# Patient Record
Sex: Male | Born: 1975 | Race: White | Hispanic: No | Marital: Married | Smoking: Current every day smoker
Health system: Southern US, Community
[De-identification: ages and names within clinical notes are randomized; demographics above are authoritative.]

## PROBLEM LIST (undated history)

## (undated) DIAGNOSIS — K219 Gastro-esophageal reflux disease without esophagitis: Secondary | ICD-10-CM

## (undated) DIAGNOSIS — I219 Acute myocardial infarction, unspecified: Secondary | ICD-10-CM

## (undated) DIAGNOSIS — M25562 Pain in left knee: Secondary | ICD-10-CM

## (undated) DIAGNOSIS — G4730 Sleep apnea, unspecified: Secondary | ICD-10-CM

## (undated) DIAGNOSIS — M25561 Pain in right knee: Secondary | ICD-10-CM

## (undated) DIAGNOSIS — F419 Anxiety disorder, unspecified: Secondary | ICD-10-CM

## (undated) DIAGNOSIS — I10 Essential (primary) hypertension: Secondary | ICD-10-CM

## (undated) DIAGNOSIS — G8929 Other chronic pain: Secondary | ICD-10-CM

## (undated) DIAGNOSIS — E7800 Pure hypercholesterolemia, unspecified: Secondary | ICD-10-CM

## (undated) DIAGNOSIS — I639 Cerebral infarction, unspecified: Secondary | ICD-10-CM

## (undated) DIAGNOSIS — I2699 Other pulmonary embolism without acute cor pulmonale: Secondary | ICD-10-CM

## (undated) DIAGNOSIS — M1990 Unspecified osteoarthritis, unspecified site: Secondary | ICD-10-CM

## (undated) DIAGNOSIS — Z8739 Personal history of other diseases of the musculoskeletal system and connective tissue: Secondary | ICD-10-CM

## (undated) DIAGNOSIS — E119 Type 2 diabetes mellitus without complications: Secondary | ICD-10-CM

## (undated) DIAGNOSIS — F4310 Post-traumatic stress disorder, unspecified: Secondary | ICD-10-CM

## (undated) HISTORY — PX: TONSILLECTOMY: SUR1361

## (undated) HISTORY — PX: HERNIA REPAIR: SHX51

## (undated) HISTORY — PX: OTHER SURGICAL HISTORY: SHX169

## (undated) HISTORY — PX: ANGIOPLASTY / STENTING FEMORAL: SUR30

---

## 2004-04-18 ENCOUNTER — Other Ambulatory Visit: Payer: Self-pay

## 2004-06-22 DIAGNOSIS — K219 Gastro-esophageal reflux disease without esophagitis: Secondary | ICD-10-CM | POA: Diagnosis present

## 2004-12-12 ENCOUNTER — Ambulatory Visit: Payer: Self-pay | Admitting: Gastroenterology

## 2005-11-27 ENCOUNTER — Ambulatory Visit: Payer: Self-pay | Admitting: Unknown Physician Specialty

## 2007-06-26 ENCOUNTER — Other Ambulatory Visit: Payer: Self-pay

## 2007-06-26 ENCOUNTER — Observation Stay: Payer: Self-pay | Admitting: Cardiology

## 2007-08-01 ENCOUNTER — Other Ambulatory Visit: Payer: Self-pay

## 2007-08-01 ENCOUNTER — Emergency Department: Payer: Self-pay | Admitting: Unknown Physician Specialty

## 2007-08-06 ENCOUNTER — Ambulatory Visit: Payer: Self-pay | Admitting: Family Medicine

## 2007-08-12 ENCOUNTER — Other Ambulatory Visit: Payer: Self-pay

## 2007-08-12 ENCOUNTER — Emergency Department: Payer: Self-pay | Admitting: Emergency Medicine

## 2007-08-14 ENCOUNTER — Ambulatory Visit: Payer: Self-pay | Admitting: Family Medicine

## 2007-08-20 ENCOUNTER — Ambulatory Visit: Payer: Self-pay | Admitting: Gastroenterology

## 2007-08-26 ENCOUNTER — Emergency Department: Payer: Self-pay | Admitting: Emergency Medicine

## 2007-08-26 ENCOUNTER — Other Ambulatory Visit: Payer: Self-pay

## 2008-02-06 ENCOUNTER — Emergency Department: Payer: Self-pay

## 2008-02-06 ENCOUNTER — Other Ambulatory Visit: Payer: Self-pay

## 2008-05-28 ENCOUNTER — Emergency Department: Payer: Self-pay | Admitting: Emergency Medicine

## 2008-06-13 ENCOUNTER — Other Ambulatory Visit: Payer: Self-pay

## 2008-06-13 ENCOUNTER — Emergency Department: Payer: Self-pay | Admitting: Emergency Medicine

## 2008-06-14 ENCOUNTER — Emergency Department: Payer: Self-pay | Admitting: Emergency Medicine

## 2008-07-29 ENCOUNTER — Ambulatory Visit: Payer: Self-pay | Admitting: Orthopedic Surgery

## 2008-10-15 ENCOUNTER — Emergency Department: Payer: Self-pay | Admitting: Emergency Medicine

## 2009-01-12 DIAGNOSIS — I10 Essential (primary) hypertension: Secondary | ICD-10-CM | POA: Diagnosis present

## 2009-03-31 ENCOUNTER — Emergency Department: Payer: Self-pay | Admitting: Emergency Medicine

## 2009-04-01 ENCOUNTER — Emergency Department: Payer: Self-pay | Admitting: Emergency Medicine

## 2009-04-06 ENCOUNTER — Emergency Department: Payer: Self-pay | Admitting: Emergency Medicine

## 2009-04-07 ENCOUNTER — Emergency Department: Payer: Self-pay | Admitting: Emergency Medicine

## 2009-04-22 ENCOUNTER — Emergency Department: Payer: Self-pay | Admitting: Emergency Medicine

## 2009-04-23 ENCOUNTER — Emergency Department: Payer: Self-pay | Admitting: Emergency Medicine

## 2009-05-17 ENCOUNTER — Ambulatory Visit: Payer: Self-pay | Admitting: Surgery

## 2009-05-17 ENCOUNTER — Emergency Department: Payer: Self-pay | Admitting: Emergency Medicine

## 2009-05-17 ENCOUNTER — Emergency Department: Payer: Self-pay

## 2009-05-18 ENCOUNTER — Emergency Department: Payer: Self-pay | Admitting: Emergency Medicine

## 2009-06-30 ENCOUNTER — Emergency Department: Payer: Self-pay | Admitting: Emergency Medicine

## 2009-08-09 ENCOUNTER — Emergency Department: Payer: Self-pay | Admitting: Internal Medicine

## 2009-09-25 ENCOUNTER — Emergency Department: Payer: Self-pay | Admitting: Emergency Medicine

## 2009-09-27 ENCOUNTER — Emergency Department: Payer: Self-pay | Admitting: Emergency Medicine

## 2009-10-22 ENCOUNTER — Emergency Department: Payer: Self-pay | Admitting: Emergency Medicine

## 2009-11-08 ENCOUNTER — Emergency Department: Payer: Self-pay | Admitting: Emergency Medicine

## 2009-11-09 ENCOUNTER — Emergency Department: Payer: Self-pay | Admitting: Emergency Medicine

## 2009-12-03 ENCOUNTER — Emergency Department: Payer: Self-pay

## 2009-12-03 ENCOUNTER — Emergency Department: Payer: Self-pay | Admitting: Emergency Medicine

## 2009-12-07 ENCOUNTER — Emergency Department (HOSPITAL_BASED_OUTPATIENT_CLINIC_OR_DEPARTMENT_OTHER): Admission: EM | Admit: 2009-12-07 | Discharge: 2009-12-07 | Payer: Self-pay | Admitting: Emergency Medicine

## 2009-12-07 ENCOUNTER — Ambulatory Visit: Payer: Self-pay | Admitting: Diagnostic Radiology

## 2010-01-26 ENCOUNTER — Emergency Department: Payer: Self-pay | Admitting: Emergency Medicine

## 2010-02-03 ENCOUNTER — Inpatient Hospital Stay: Payer: Self-pay | Admitting: Psychiatry

## 2010-03-06 ENCOUNTER — Emergency Department: Payer: Self-pay | Admitting: Emergency Medicine

## 2010-04-21 ENCOUNTER — Emergency Department: Payer: Self-pay | Admitting: Unknown Physician Specialty

## 2010-05-09 ENCOUNTER — Ambulatory Visit: Payer: Self-pay | Admitting: Cardiology

## 2010-06-24 ENCOUNTER — Emergency Department: Payer: Self-pay | Admitting: Emergency Medicine

## 2010-07-26 DIAGNOSIS — F17210 Nicotine dependence, cigarettes, uncomplicated: Secondary | ICD-10-CM | POA: Insufficient documentation

## 2010-07-26 DIAGNOSIS — Z7189 Other specified counseling: Secondary | ICD-10-CM | POA: Insufficient documentation

## 2010-07-26 DIAGNOSIS — Z0000 Encounter for general adult medical examination without abnormal findings: Secondary | ICD-10-CM | POA: Insufficient documentation

## 2010-09-08 ENCOUNTER — Inpatient Hospital Stay: Payer: Self-pay | Admitting: Internal Medicine

## 2010-09-12 ENCOUNTER — Inpatient Hospital Stay: Payer: Self-pay | Admitting: Internal Medicine

## 2010-09-12 LAB — PATHOLOGY REPORT

## 2010-11-01 ENCOUNTER — Ambulatory Visit: Payer: Self-pay | Admitting: Unknown Physician Specialty

## 2010-11-12 ENCOUNTER — Ambulatory Visit: Payer: Self-pay | Admitting: Family Medicine

## 2010-11-15 ENCOUNTER — Ambulatory Visit: Payer: Self-pay | Admitting: Family Medicine

## 2011-02-06 LAB — APTT: aPTT: 27 s (ref 24–37)

## 2011-02-06 LAB — CBC
HCT: 50.2 % (ref 39.0–52.0)
Hemoglobin: 17.1 g/dL — ABNORMAL HIGH (ref 13.0–17.0)
MCHC: 34 g/dL (ref 30.0–36.0)
MCV: 92.7 fL (ref 78.0–100.0)
Platelets: 235 10*3/uL (ref 150–400)
RBC: 5.42 MIL/uL (ref 4.22–5.81)
RDW: 12.3 % (ref 11.5–15.5)
WBC: 10.1 10*3/uL (ref 4.0–10.5)

## 2011-02-06 LAB — DIFFERENTIAL
Basophils Absolute: 0 10*3/uL (ref 0.0–0.1)
Basophils Relative: 0 % (ref 0–1)
Eosinophils Absolute: 0.6 10*3/uL (ref 0.0–0.7)
Eosinophils Relative: 6 % — ABNORMAL HIGH (ref 0–5)
Lymphocytes Relative: 35 % (ref 12–46)
Lymphs Abs: 3.5 K/uL (ref 0.7–4.0)
Monocytes Absolute: 0.4 10*3/uL (ref 0.1–1.0)
Monocytes Relative: 4 % (ref 3–12)
Neutro Abs: 5.6 K/uL (ref 1.7–7.7)
Neutrophils Relative %: 55 % (ref 43–77)

## 2011-02-06 LAB — URINALYSIS, ROUTINE W REFLEX MICROSCOPIC
Bilirubin Urine: NEGATIVE
Glucose, UA: NEGATIVE mg/dL
Hgb urine dipstick: NEGATIVE
Ketones, ur: NEGATIVE mg/dL
Nitrite: NEGATIVE
Protein, ur: NEGATIVE mg/dL
Specific Gravity, Urine: 1.02 (ref 1.005–1.030)
Urobilinogen, UA: 0.2 mg/dL (ref 0.0–1.0)
pH: 5.5 (ref 5.0–8.0)

## 2011-02-06 LAB — COMPREHENSIVE METABOLIC PANEL WITH GFR
ALT: 94 U/L — ABNORMAL HIGH (ref 0–53)
AST: 41 U/L — ABNORMAL HIGH (ref 0–37)
Albumin: 4.8 g/dL (ref 3.5–5.2)
CO2: 28 meq/L (ref 19–32)
Chloride: 104 meq/L (ref 96–112)
Creatinine, Ser: 0.9 mg/dL (ref 0.4–1.5)
GFR calc non Af Amer: >60 mL/min (ref >60)
Sodium: 142 meq/L (ref 135–145)
Total Bilirubin: 0.6 mg/dL (ref 0.3–1.2)

## 2011-02-06 LAB — COMPREHENSIVE METABOLIC PANEL
Alkaline Phosphatase: 85 U/L (ref 39–117)
BUN: 8 mg/dL (ref 6–23)
Calcium: 9.6 mg/dL (ref 8.4–10.5)
GFR calc Af Amer: >60 mL/min (ref >60)
Glucose, Bld: 106 mg/dL — ABNORMAL HIGH (ref 70–99)
Potassium: 3.6 mEq/L (ref 3.5–5.1)
Total Protein: 7.5 g/dL (ref 6.0–8.3)

## 2011-02-06 LAB — LIPASE, BLOOD: Lipase: 67 U/L (ref 23–300)

## 2011-02-06 LAB — HEMOCCULT GUIAC POC 1CARD (OFFICE): Fecal Occult Bld: NEGATIVE

## 2011-02-06 LAB — AMYLASE: Amylase: 48 U/L (ref 0–105)

## 2011-02-06 LAB — PROTIME-INR
INR: 0.98 (ref 0.00–1.49)
Prothrombin Time: 12.9 seconds (ref 11.6–15.2)

## 2011-02-06 LAB — D-DIMER, QUANTITATIVE: D-Dimer, Quant: <0.22 ug{FEU}/mL (ref 0.00–0.48)

## 2011-02-28 ENCOUNTER — Observation Stay: Payer: Self-pay | Admitting: Internal Medicine

## 2011-05-16 ENCOUNTER — Emergency Department: Payer: Self-pay | Admitting: Emergency Medicine

## 2011-05-29 ENCOUNTER — Other Ambulatory Visit: Payer: Self-pay | Admitting: Orthopedic Surgery

## 2011-05-29 DIAGNOSIS — M25562 Pain in left knee: Secondary | ICD-10-CM

## 2011-06-23 ENCOUNTER — Observation Stay: Payer: Self-pay | Admitting: Internal Medicine

## 2011-12-04 DIAGNOSIS — M1710 Unilateral primary osteoarthritis, unspecified knee: Secondary | ICD-10-CM | POA: Insufficient documentation

## 2012-02-22 DIAGNOSIS — Z8651 Personal history of combat and operational stress reaction: Secondary | ICD-10-CM | POA: Insufficient documentation

## 2012-03-14 ENCOUNTER — Emergency Department: Payer: Self-pay | Admitting: Emergency Medicine

## 2012-03-14 LAB — CBC
HCT: 48.1 % (ref 40.0–52.0)
MCHC: 34.5 g/dL (ref 32.0–36.0)
Platelet: 231 10*3/uL (ref 150–440)
RBC: 5.14 10*6/uL (ref 4.40–5.90)

## 2012-03-14 LAB — COMPREHENSIVE METABOLIC PANEL
BUN: 11 mg/dL (ref 7–18)
Co2: 26 mmol/L (ref 21–32)
Creatinine: 1.01 mg/dL (ref 0.60–1.30)
Osmolality: 278 (ref 275–301)
Potassium: 3.5 mmol/L (ref 3.5–5.1)

## 2012-03-15 ENCOUNTER — Emergency Department: Payer: Self-pay | Admitting: Emergency Medicine

## 2012-03-15 LAB — COMPREHENSIVE METABOLIC PANEL
Albumin: 4 g/dL (ref 3.4–5.0)
BUN: 10 mg/dL (ref 7–18)
EGFR (African American): >60
Glucose: 112 mg/dL — ABNORMAL HIGH (ref 65–99)
Potassium: 3.7 mmol/L (ref 3.5–5.1)
SGOT(AST): 34 U/L (ref 15–37)
SGPT (ALT): 67 U/L
Sodium: 140 mmol/L (ref 136–145)

## 2012-03-15 LAB — CBC
HCT: 49 %
HGB: 17 g/dL
MCH: 32 pg
MCHC: 34.6 g/dL
MCV: 93 fL
Platelet: 251 x10 3/mm 3
RBC: 5.29 x10 6/mm 3
RDW: 12.8 %
WBC: 11.5 x10 3/mm 3 — ABNORMAL HIGH

## 2012-03-15 LAB — PROTIME-INR
INR: 0.8
Prothrombin Time: 11.8 s

## 2012-04-03 ENCOUNTER — Ambulatory Visit: Payer: Self-pay | Admitting: Internal Medicine

## 2012-04-08 ENCOUNTER — Ambulatory Visit: Payer: Self-pay

## 2012-04-11 ENCOUNTER — Emergency Department: Payer: Self-pay | Admitting: *Deleted

## 2012-04-11 LAB — COMPREHENSIVE METABOLIC PANEL
Alkaline Phosphatase: 106 U/L (ref 50–136)
Anion Gap: 10 (ref 7–16)
BUN: 12 mg/dL (ref 7–18)
Creatinine: 0.98 mg/dL (ref 0.60–1.30)
Osmolality: 276 (ref 275–301)
SGOT(AST): 25 U/L (ref 15–37)
Total Protein: 7.9 g/dL (ref 6.4–8.2)

## 2012-04-11 LAB — CBC
MCV: 92 fL (ref 80–100)
Platelet: 236 10*3/uL (ref 150–440)
RBC: 5.45 10*6/uL (ref 4.40–5.90)
WBC: 9.9 10*3/uL (ref 3.8–10.6)

## 2012-05-07 ENCOUNTER — Observation Stay: Payer: Self-pay | Admitting: Internal Medicine

## 2012-05-07 LAB — COMPREHENSIVE METABOLIC PANEL
Albumin: 3.8 g/dL (ref 3.4–5.0)
BUN: 11 mg/dL (ref 7–18)
Calcium, Total: 9 mg/dL (ref 8.5–10.1)
Chloride: 103 mmol/L (ref 98–107)
EGFR (African American): >60
EGFR (Non-African Amer.): >60
Glucose: 135 mg/dL — ABNORMAL HIGH (ref 65–99)
Osmolality: 277 (ref 275–301)
SGPT (ALT): 75 U/L
Sodium: 138 mmol/L (ref 136–145)
Total Protein: 7.4 g/dL (ref 6.4–8.2)

## 2012-05-07 LAB — CBC WITH DIFFERENTIAL/PLATELET
HCT: 50.2 % (ref 40.0–52.0)
Lymphocyte #: 3.3 10*3/uL (ref 1.0–3.6)
Lymphocyte %: 35.7 %
MCHC: 34.6 g/dL (ref 32.0–36.0)
Monocyte %: 8.1 %
Neutrophil %: 49.8 %
Platelet: 227 10*3/uL (ref 150–440)
RBC: 5.4 10*6/uL (ref 4.40–5.90)
WBC: 9.1 10*3/uL (ref 3.8–10.6)

## 2012-05-07 LAB — CK TOTAL AND CKMB (NOT AT ARMC): CK, Total: 89 U/L (ref 35–232)

## 2012-05-08 LAB — BASIC METABOLIC PANEL
Calcium, Total: 9.5 mg/dL (ref 8.5–10.1)
Chloride: 104 mmol/L (ref 98–107)
Co2: 24 mmol/L (ref 21–32)
EGFR (African American): >60
EGFR (Non-African Amer.): >60
Osmolality: 284 (ref 275–301)
Potassium: 3.9 mmol/L (ref 3.5–5.1)

## 2012-05-08 LAB — CK TOTAL AND CKMB (NOT AT ARMC)
CK, Total: 76 U/L (ref 35–232)
CK-MB: <0.5 ng/mL — ABNORMAL LOW (ref 0.5–3.6)
CK-MB: <0.5 ng/mL — ABNORMAL LOW (ref 0.5–3.6)

## 2012-05-08 LAB — TROPONIN I: Troponin-I: <0.02 ng/mL

## 2012-05-15 ENCOUNTER — Emergency Department: Payer: Self-pay | Admitting: *Deleted

## 2012-05-16 ENCOUNTER — Emergency Department: Payer: Self-pay | Admitting: *Deleted

## 2012-05-16 LAB — COMPREHENSIVE METABOLIC PANEL
Albumin: 3.8 g/dL (ref 3.4–5.0)
Alkaline Phosphatase: 101 U/L (ref 50–136)
BUN: 11 mg/dL (ref 7–18)
Bilirubin,Total: 0.5 mg/dL (ref 0.2–1.0)
Chloride: 105 mmol/L (ref 98–107)
Co2: 28 mmol/L (ref 21–32)
Creatinine: 1.09 mg/dL (ref 0.60–1.30)
EGFR (African American): >60
EGFR (Non-African Amer.): >60
Glucose: 164 mg/dL — ABNORMAL HIGH (ref 65–99)
Osmolality: 281 (ref 275–301)
Sodium: 139 mmol/L (ref 136–145)
Total Protein: 7.5 g/dL (ref 6.4–8.2)

## 2012-05-16 LAB — CBC
HCT: 50 % (ref 40.0–52.0)
MCHC: 34.9 g/dL (ref 32.0–36.0)
MCV: 92 fL (ref 80–100)
RBC: 5.41 10*6/uL (ref 4.40–5.90)
WBC: 9.5 10*3/uL (ref 3.8–10.6)

## 2012-06-13 ENCOUNTER — Ambulatory Visit: Payer: Self-pay

## 2012-07-03 ENCOUNTER — Emergency Department: Payer: Self-pay

## 2012-09-03 ENCOUNTER — Ambulatory Visit: Payer: Self-pay | Admitting: Internal Medicine

## 2012-12-13 ENCOUNTER — Ambulatory Visit: Payer: Self-pay | Admitting: Family Medicine

## 2012-12-20 ENCOUNTER — Ambulatory Visit: Payer: Self-pay | Admitting: Emergency Medicine

## 2013-01-14 ENCOUNTER — Emergency Department: Payer: Self-pay | Admitting: Unknown Physician Specialty

## 2013-02-24 ENCOUNTER — Emergency Department: Payer: Self-pay | Admitting: Emergency Medicine

## 2013-02-24 LAB — BASIC METABOLIC PANEL
Anion Gap: 6 — ABNORMAL LOW (ref 7–16)
Calcium, Total: 8.6 mg/dL (ref 8.5–10.1)
Chloride: 103 mmol/L (ref 98–107)
Co2: 27 mmol/L (ref 21–32)
Creatinine: 0.8 mg/dL (ref 0.60–1.30)
EGFR (African American): >60
Osmolality: 270 (ref 275–301)
Sodium: 136 mmol/L (ref 136–145)

## 2013-02-24 LAB — CBC
HGB: 18.1 g/dL — ABNORMAL HIGH (ref 13.0–18.0)
MCH: 33.3 pg (ref 26.0–34.0)
MCHC: 35.8 g/dL (ref 32.0–36.0)
MCV: 93 fL (ref 80–100)
Platelet: 226 10*3/uL (ref 150–440)
RBC: 5.45 10*6/uL (ref 4.40–5.90)
RDW: 13.5 % (ref 11.5–14.5)
WBC: 9.5 10*3/uL (ref 3.8–10.6)

## 2013-02-24 LAB — DRUG SCREEN, URINE
Amphetamines, Ur Screen: NEGATIVE (ref ?–1000)
Benzodiazepine, Ur Scrn: POSITIVE (ref ?–200)
Cocaine Metabolite,Ur ~~LOC~~: NEGATIVE (ref ?–300)
Opiate, Ur Screen: NEGATIVE (ref ?–300)
Phencyclidine (PCP) Ur S: NEGATIVE (ref ?–25)
Tricyclic, Ur Screen: NEGATIVE (ref ?–1000)

## 2013-02-24 LAB — PROTIME-INR
INR: 0.9
Prothrombin Time: 12.8 secs (ref 11.5–14.7)

## 2013-02-24 LAB — ETHANOL: Ethanol: <3 mg/dL

## 2013-02-27 DIAGNOSIS — Z79899 Other long term (current) drug therapy: Secondary | ICD-10-CM | POA: Insufficient documentation

## 2013-03-31 ENCOUNTER — Ambulatory Visit: Payer: Self-pay | Admitting: Emergency Medicine

## 2013-04-23 ENCOUNTER — Other Ambulatory Visit: Payer: Self-pay | Admitting: Orthopedic Surgery

## 2013-04-23 DIAGNOSIS — M545 Low back pain: Secondary | ICD-10-CM

## 2013-04-28 ENCOUNTER — Ambulatory Visit
Admission: RE | Admit: 2013-04-28 | Discharge: 2013-04-28 | Disposition: A | Discharge status: Home / Self Care | Payer: Medicare Other | Source: Ambulatory Visit | Attending: Orthopedic Surgery | Admitting: Orthopedic Surgery

## 2013-04-28 ENCOUNTER — Other Ambulatory Visit: Payer: Self-pay

## 2013-04-28 DIAGNOSIS — M545 Low back pain: Secondary | ICD-10-CM

## 2013-06-24 ENCOUNTER — Emergency Department: Payer: Self-pay | Admitting: Emergency Medicine

## 2013-06-24 LAB — CBC WITH DIFFERENTIAL/PLATELET
Basophil #: 0.1 10*3/uL (ref 0.0–0.1)
Basophil %: 0.5 %
Eosinophil #: 0.7 10*3/uL (ref 0.0–0.7)
Eosinophil %: 5.9 %
Lymphocyte #: 3.7 10*3/uL — ABNORMAL HIGH (ref 1.0–3.6)
Lymphocyte %: 30.4 %
MCH: 33.2 pg (ref 26.0–34.0)
MCHC: 35.6 g/dL (ref 32.0–36.0)
MCV: 93 fL (ref 80–100)
Neutrophil #: 6.7 10*3/uL — ABNORMAL HIGH (ref 1.4–6.5)
Platelet: 242 10*3/uL (ref 150–440)
RBC: 5.64 10*6/uL (ref 4.40–5.90)
RDW: 13.7 % (ref 11.5–14.5)

## 2013-06-24 LAB — BASIC METABOLIC PANEL
BUN: 9 mg/dL (ref 7–18)
Calcium, Total: 8.9 mg/dL (ref 8.5–10.1)
Chloride: 106 mmol/L (ref 98–107)
Co2: 27 mmol/L (ref 21–32)
EGFR (Non-African Amer.): >60
Glucose: 92 mg/dL (ref 65–99)
Potassium: 3.7 mmol/L (ref 3.5–5.1)

## 2013-06-24 LAB — URINALYSIS, COMPLETE
Bacteria: NONE SEEN
Bilirubin,UR: NEGATIVE
Nitrite: NEGATIVE
Ph: 5 (ref 4.5–8.0)
RBC,UR: 2 /HPF (ref 0–5)
WBC UR: 1 /HPF (ref 0–5)

## 2013-06-26 LAB — URINE CULTURE

## 2013-06-28 ENCOUNTER — Emergency Department: Payer: Self-pay | Admitting: Emergency Medicine

## 2013-06-28 LAB — CBC WITH DIFFERENTIAL/PLATELET
Basophil #: 0 10*3/uL (ref 0.0–0.1)
Basophil %: 0.4 %
Eosinophil %: 5.4 %
HCT: 49.6 % (ref 40.0–52.0)
Lymphocyte #: 3.6 10*3/uL (ref 1.0–3.6)
Lymphocyte %: 28.6 %
MCH: 33.9 pg (ref 26.0–34.0)
MCV: 93 fL (ref 80–100)
Monocyte #: 0.6 x10 3/mm (ref 0.2–1.0)
Monocyte %: 4.6 %
Neutrophil #: 7.6 10*3/uL — ABNORMAL HIGH (ref 1.4–6.5)
RBC: 5.34 10*6/uL (ref 4.40–5.90)
WBC: 12.4 10*3/uL — ABNORMAL HIGH (ref 3.8–10.6)

## 2013-06-28 LAB — COMPREHENSIVE METABOLIC PANEL
Albumin: 3.7 g/dL (ref 3.4–5.0)
Alkaline Phosphatase: 127 U/L (ref 50–136)
Anion Gap: 10 (ref 7–16)
BUN: 10 mg/dL (ref 7–18)
Bilirubin,Total: 0.3 mg/dL (ref 0.2–1.0)
Calcium, Total: 8.7 mg/dL (ref 8.5–10.1)
Chloride: 103 mmol/L (ref 98–107)
Co2: 26 mmol/L (ref 21–32)
Creatinine: 0.96 mg/dL (ref 0.60–1.30)
EGFR (African American): >60
EGFR (Non-African Amer.): >60
Glucose: 147 mg/dL — ABNORMAL HIGH (ref 65–99)
Osmolality: 279 (ref 275–301)
Potassium: 3.3 mmol/L — ABNORMAL LOW (ref 3.5–5.1)
SGOT(AST): 32 U/L (ref 15–37)
SGPT (ALT): 70 U/L (ref 12–78)
Sodium: 139 mmol/L (ref 136–145)
Total Protein: 7.2 g/dL (ref 6.4–8.2)

## 2013-06-28 LAB — TROPONIN I
Troponin-I: <0.02 ng/mL
Troponin-I: <0.02 ng/mL

## 2013-07-01 ENCOUNTER — Ambulatory Visit: Payer: Self-pay | Admitting: Cardiology

## 2013-09-06 ENCOUNTER — Emergency Department: Payer: Self-pay | Admitting: Emergency Medicine

## 2013-09-06 LAB — COMPREHENSIVE METABOLIC PANEL
Albumin: 4 g/dL (ref 3.4–5.0)
Anion Gap: 6 — ABNORMAL LOW (ref 7–16)
BUN: 9 mg/dL (ref 7–18)
Chloride: 102 mmol/L (ref 98–107)
EGFR (African American): >60
EGFR (Non-African Amer.): >60
Osmolality: 275 (ref 275–301)
SGOT(AST): 48 U/L — ABNORMAL HIGH (ref 15–37)
SGPT (ALT): 99 U/L — ABNORMAL HIGH (ref 12–78)

## 2013-09-06 LAB — CBC
MCH: 34.3 pg — ABNORMAL HIGH (ref 26.0–34.0)
MCHC: 36.2 g/dL — ABNORMAL HIGH (ref 32.0–36.0)
RBC: 5.65 10*6/uL (ref 4.40–5.90)

## 2013-09-07 LAB — TROPONIN I: Troponin-I: <0.02 ng/mL

## 2013-11-05 ENCOUNTER — Emergency Department: Payer: Self-pay | Admitting: Emergency Medicine

## 2013-11-20 DIAGNOSIS — I639 Cerebral infarction, unspecified: Secondary | ICD-10-CM | POA: Diagnosis present

## 2013-11-20 HISTORY — DX: Cerebral infarction, unspecified: I63.9

## 2013-11-28 ENCOUNTER — Emergency Department: Payer: Self-pay | Admitting: Emergency Medicine

## 2013-11-28 LAB — CBC WITH DIFFERENTIAL/PLATELET
BASOS PCT: 0.5 %
Basophil #: 0.1 10*3/uL (ref 0.0–0.1)
Eosinophil #: 0.6 10*3/uL (ref 0.0–0.7)
Eosinophil %: 4.7 %
HCT: 49.6 % (ref 40.0–52.0)
HGB: 17.4 g/dL (ref 13.0–18.0)
LYMPHS PCT: 39 %
Lymphocyte #: 5.2 10*3/uL — ABNORMAL HIGH (ref 1.0–3.6)
MCH: 32.7 pg (ref 26.0–34.0)
MCHC: 35 g/dL (ref 32.0–36.0)
MCV: 93 fL (ref 80–100)
Monocyte #: 0.9 x10 3/mm (ref 0.2–1.0)
Monocyte %: 6.5 %
Neutrophil #: 6.6 10*3/uL — ABNORMAL HIGH (ref 1.4–6.5)
Neutrophil %: 49.3 %
PLATELETS: 232 10*3/uL (ref 150–440)
RBC: 5.32 10*6/uL (ref 4.40–5.90)
RDW: 13.7 % (ref 11.5–14.5)
WBC: 13.3 10*3/uL — ABNORMAL HIGH (ref 3.8–10.6)

## 2013-11-28 LAB — URINALYSIS, COMPLETE
Bacteria: NONE SEEN
Bilirubin,UR: NEGATIVE
Blood: NEGATIVE
Glucose,UR: NEGATIVE mg/dL (ref 0–75)
Ketone: NEGATIVE
LEUKOCYTE ESTERASE: NEGATIVE
Nitrite: NEGATIVE
PH: 6 (ref 4.5–8.0)
Protein: NEGATIVE
RBC,UR: <1 /HPF (ref 0–5)
Specific Gravity: 1.006 (ref 1.003–1.030)
WBC UR: NONE SEEN /HPF (ref 0–5)

## 2013-11-28 LAB — COMPREHENSIVE METABOLIC PANEL
ALK PHOS: 109 U/L
Albumin: 3.9 g/dL (ref 3.4–5.0)
Anion Gap: 4 — ABNORMAL LOW (ref 7–16)
BUN: 10 mg/dL (ref 7–18)
Bilirubin,Total: 0.6 mg/dL (ref 0.2–1.0)
CALCIUM: 9.1 mg/dL (ref 8.5–10.1)
Chloride: 99 mmol/L (ref 98–107)
Co2: 29 mmol/L (ref 21–32)
Creatinine: 0.97 mg/dL (ref 0.60–1.30)
Glucose: 128 mg/dL — ABNORMAL HIGH (ref 65–99)
Osmolality: 265 (ref 275–301)
POTASSIUM: 3.1 mmol/L — AB (ref 3.5–5.1)
SGOT(AST): 50 U/L — ABNORMAL HIGH (ref 15–37)
SGPT (ALT): 116 U/L — ABNORMAL HIGH (ref 12–78)
Sodium: 132 mmol/L — ABNORMAL LOW (ref 136–145)
Total Protein: 7.7 g/dL (ref 6.4–8.2)

## 2013-11-28 LAB — LIPASE, BLOOD: LIPASE: 91 U/L (ref 73–393)

## 2014-06-08 ENCOUNTER — Emergency Department: Payer: Self-pay | Admitting: Emergency Medicine

## 2014-06-08 LAB — CBC WITH DIFFERENTIAL/PLATELET
BASOS PCT: 0.4 %
Basophil #: 0 10*3/uL (ref 0.0–0.1)
EOS ABS: 0.6 10*3/uL (ref 0.0–0.7)
EOS PCT: 5.2 %
HCT: 53 % — ABNORMAL HIGH (ref 40.0–52.0)
HGB: 18.3 g/dL — ABNORMAL HIGH (ref 13.0–18.0)
Lymphocyte #: 3.7 10*3/uL — ABNORMAL HIGH (ref 1.0–3.6)
Lymphocyte %: 32.6 %
MCH: 33.4 pg (ref 26.0–34.0)
MCHC: 34.6 g/dL (ref 32.0–36.0)
MCV: 97 fL (ref 80–100)
Monocyte #: 0.6 x10 3/mm (ref 0.2–1.0)
Monocyte %: 5.6 %
NEUTROS ABS: 6.3 10*3/uL (ref 1.4–6.5)
NEUTROS PCT: 56.2 %
PLATELETS: 238 10*3/uL (ref 150–440)
RBC: 5.49 10*6/uL (ref 4.40–5.90)
RDW: 13.7 % (ref 11.5–14.5)
WBC: 11.2 10*3/uL — AB (ref 3.8–10.6)

## 2014-06-08 LAB — COMPREHENSIVE METABOLIC PANEL
ANION GAP: 9 (ref 7–16)
AST: 38 U/L — AB (ref 15–37)
Albumin: 3.9 g/dL (ref 3.4–5.0)
Alkaline Phosphatase: 114 U/L
BILIRUBIN TOTAL: 0.4 mg/dL (ref 0.2–1.0)
BUN: 13 mg/dL (ref 7–18)
Calcium, Total: 9.4 mg/dL (ref 8.5–10.1)
Chloride: 103 mmol/L (ref 98–107)
Co2: 26 mmol/L (ref 21–32)
Creatinine: 1.25 mg/dL (ref 0.60–1.30)
Glucose: 120 mg/dL — ABNORMAL HIGH (ref 65–99)
OSMOLALITY: 277 (ref 275–301)
POTASSIUM: 3.3 mmol/L — AB (ref 3.5–5.1)
SGPT (ALT): 69 U/L (ref 12–78)
Sodium: 138 mmol/L (ref 136–145)
Total Protein: 7.7 g/dL (ref 6.4–8.2)

## 2014-06-08 LAB — TROPONIN I

## 2014-06-08 LAB — LIPASE, BLOOD: Lipase: 131 U/L (ref 73–393)

## 2014-06-24 DIAGNOSIS — K029 Dental caries, unspecified: Secondary | ICD-10-CM | POA: Insufficient documentation

## 2014-11-09 ENCOUNTER — Observation Stay: Payer: Self-pay | Admitting: Specialist

## 2014-11-09 LAB — CBC
HCT: 54.2 % — ABNORMAL HIGH (ref 40.0–52.0)
HGB: 18.8 g/dL — AB (ref 13.0–18.0)
MCH: 33.8 pg (ref 26.0–34.0)
MCHC: 34.8 g/dL (ref 32.0–36.0)
MCV: 97 fL (ref 80–100)
Platelet: 235 10*3/uL (ref 150–440)
RBC: 5.58 10*6/uL (ref 4.40–5.90)
RDW: 13.5 % (ref 11.5–14.5)
WBC: 11.1 10*3/uL — ABNORMAL HIGH (ref 3.8–10.6)

## 2014-11-09 LAB — COMPREHENSIVE METABOLIC PANEL
ALT: 66 U/L — AB
Albumin: 4.1 g/dL (ref 3.4–5.0)
Alkaline Phosphatase: 104 U/L
Anion Gap: 6 — ABNORMAL LOW (ref 7–16)
BILIRUBIN TOTAL: 0.4 mg/dL (ref 0.2–1.0)
BUN: 9 mg/dL (ref 7–18)
Calcium, Total: 9.3 mg/dL (ref 8.5–10.1)
Chloride: 103 mmol/L (ref 98–107)
Co2: 28 mmol/L (ref 21–32)
Creatinine: 0.96 mg/dL (ref 0.60–1.30)
EGFR (African American): >60
Glucose: 97 mg/dL (ref 65–99)
OSMOLALITY: 272 (ref 275–301)
Potassium: 3.3 mmol/L — ABNORMAL LOW (ref 3.5–5.1)
SGOT(AST): 36 U/L (ref 15–37)
Sodium: 137 mmol/L (ref 136–145)
TOTAL PROTEIN: 7.9 g/dL (ref 6.4–8.2)

## 2014-11-09 LAB — APTT: Activated PTT: 24.3 secs (ref 23.6–35.9)

## 2014-11-09 LAB — PROTIME-INR
INR: 1
PROTHROMBIN TIME: 12.9 s (ref 11.5–14.7)

## 2014-11-10 LAB — CBC WITH DIFFERENTIAL/PLATELET
BASOS PCT: 0.1 %
Basophil #: 0 10*3/uL (ref 0.0–0.1)
Eosinophil #: 0 10*3/uL (ref 0.0–0.7)
Eosinophil %: 0.1 %
HCT: 50.4 % (ref 40.0–52.0)
HGB: 17.4 g/dL (ref 13.0–18.0)
LYMPHS ABS: 1.4 10*3/uL (ref 1.0–3.6)
LYMPHS PCT: 12 %
MCH: 33.7 pg (ref 26.0–34.0)
MCHC: 34.5 g/dL (ref 32.0–36.0)
MCV: 98 fL (ref 80–100)
MONO ABS: 0.5 x10 3/mm (ref 0.2–1.0)
MONOS PCT: 4.2 %
NEUTROS PCT: 83.6 %
Neutrophil #: 9.7 10*3/uL — ABNORMAL HIGH (ref 1.4–6.5)
Platelet: 256 10*3/uL (ref 150–440)
RBC: 5.15 10*6/uL (ref 4.40–5.90)
RDW: 13.7 % (ref 11.5–14.5)
WBC: 11.6 10*3/uL — AB (ref 3.8–10.6)

## 2014-11-10 LAB — BASIC METABOLIC PANEL
Anion Gap: 6 — ABNORMAL LOW (ref 7–16)
BUN: 7 mg/dL (ref 7–18)
CREATININE: 0.87 mg/dL (ref 0.60–1.30)
Calcium, Total: 9.2 mg/dL (ref 8.5–10.1)
Chloride: 104 mmol/L (ref 98–107)
Co2: 26 mmol/L (ref 21–32)
EGFR (African American): >60
GLUCOSE: 155 mg/dL — AB (ref 65–99)
Osmolality: 273 (ref 275–301)
POTASSIUM: 4.1 mmol/L (ref 3.5–5.1)
Sodium: 136 mmol/L (ref 136–145)

## 2014-11-10 LAB — LIPID PANEL
Cholesterol: 256 mg/dL — ABNORMAL HIGH (ref 0–200)
HDL Cholesterol: 55 mg/dL (ref 40–60)
LDL CHOLESTEROL, CALC: 180 mg/dL — AB (ref 0–100)
Triglycerides: 107 mg/dL (ref 0–200)
VLDL Cholesterol, Calc: 21 mg/dL (ref 5–40)

## 2014-11-10 LAB — HEMOGLOBIN A1C: HEMOGLOBIN A1C: 5.4 % (ref 4.2–6.3)

## 2014-11-10 LAB — TSH: Thyroid Stimulating Horm: 0.707 u[IU]/mL

## 2014-11-14 ENCOUNTER — Ambulatory Visit: Payer: Self-pay | Admitting: Emergency Medicine

## 2014-12-23 ENCOUNTER — Emergency Department: Payer: Self-pay | Admitting: Student

## 2014-12-23 LAB — COMPREHENSIVE METABOLIC PANEL
ALT: 84 U/L — AB (ref 14–63)
Albumin: 3.9 g/dL (ref 3.4–5.0)
Alkaline Phosphatase: 127 U/L — ABNORMAL HIGH (ref 46–116)
Anion Gap: 8 (ref 7–16)
BILIRUBIN TOTAL: 0.5 mg/dL (ref 0.2–1.0)
BUN: 11 mg/dL (ref 7–18)
CREATININE: 1.12 mg/dL (ref 0.60–1.30)
Calcium, Total: 9.6 mg/dL (ref 8.5–10.1)
Chloride: 96 mmol/L — ABNORMAL LOW (ref 98–107)
Co2: 32 mmol/L (ref 21–32)
Glucose: 112 mg/dL — ABNORMAL HIGH (ref 65–99)
OSMOLALITY: 272 (ref 275–301)
Potassium: 3 mmol/L — ABNORMAL LOW (ref 3.5–5.1)
SGOT(AST): 47 U/L — ABNORMAL HIGH (ref 15–37)
SODIUM: 136 mmol/L (ref 136–145)
Total Protein: 7.9 g/dL (ref 6.4–8.2)

## 2014-12-23 LAB — CBC WITH DIFFERENTIAL/PLATELET
Basophil #: 0.1 10*3/uL (ref 0.0–0.1)
Basophil %: 0.5 %
EOS ABS: 0.7 10*3/uL (ref 0.0–0.7)
Eosinophil %: 4.9 %
HCT: 49.2 % (ref 40.0–52.0)
HGB: 17.3 g/dL (ref 13.0–18.0)
LYMPHS ABS: 4.2 10*3/uL — AB (ref 1.0–3.6)
LYMPHS PCT: 31.4 %
MCH: 33 pg (ref 26.0–34.0)
MCHC: 35.1 g/dL (ref 32.0–36.0)
MCV: 94 fL (ref 80–100)
Monocyte #: 0.9 x10 3/mm (ref 0.2–1.0)
Monocyte %: 6.5 %
NEUTROS ABS: 7.6 10*3/uL — AB (ref 1.4–6.5)
Neutrophil %: 56.7 %
PLATELETS: 255 10*3/uL (ref 150–440)
RBC: 5.24 10*6/uL (ref 4.40–5.90)
RDW: 12.7 % (ref 11.5–14.5)
WBC: 13.5 10*3/uL — ABNORMAL HIGH (ref 3.8–10.6)

## 2014-12-23 LAB — URINALYSIS, COMPLETE
Bacteria: NONE SEEN
Bilirubin,UR: NEGATIVE
Blood: NEGATIVE
GLUCOSE, UR: NEGATIVE mg/dL (ref 0–75)
Ketone: NEGATIVE
Leukocyte Esterase: NEGATIVE
Nitrite: NEGATIVE
Ph: 6 (ref 4.5–8.0)
Protein: NEGATIVE
RBC,UR: 1 /HPF (ref 0–5)
SQUAMOUS EPITHELIAL: NONE SEEN
Specific Gravity: 1.011 (ref 1.003–1.030)

## 2014-12-23 LAB — LIPASE, BLOOD: Lipase: 152 U/L (ref 73–393)

## 2015-01-02 ENCOUNTER — Emergency Department: Payer: Self-pay | Admitting: Emergency Medicine

## 2015-01-26 ENCOUNTER — Emergency Department: Payer: Self-pay | Admitting: Emergency Medicine
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
## 2015-03-13 NOTE — Discharge Summary (Signed)
PATIENT NAME:  Caleb, [name] MR#:  [id number] DATE OF BIRTH:  [birth year]  For a detailed note, please look at the history and physical done on admission by Dr. Dustin Flock.  DIAGNOSES AT DISCHARGE: As follows: 1.  Headache secondary to possibly an infected tooth, left-sided weakness due to migraine, and  headache now resolved.  2.  Hypertension.  3.  Gastroesophageal reflux disease.  4.  Hyperlipidemia. 5.  Infected molar tooth on the left.   DIET: The patient is being discharged on a low-sodium, low-fat diet.   ACTIVITY: As tolerated.   FOLLOWUP: With his primary care physician in the next 1-2 weeks. Also follow up with [facility] for tooth extraction.   DISCHARGE MEDICATIONS: Simvastatin 40 mg daily, oxycodone 15 mg q. 6 hours as needed, Carafate 1 gram q.i.d. as needed, Benadryl 25 mg q. 6 hours as needed, hydrochlorothiazide, losartan 25/100 one 1 tab daily, Nexium 40 mg daily.   Dent COURSE: Cristopher Estimable. Mar Daring, MD, from neurology.   PERTINENT STUDIES DONE DURING THE HOSPITAL COURSE: A CT scan of the head done without contrast showing no acute intracranial process. No acute intracranial mass, hemorrhage, or stroke. A 2-dimensional echocardiogram showing normal ejection fraction of 60%-65%, moderate LVH. A carotid duplex showing no evidence of any hemodynamically significant carotid artery stenosis.   HOSPITAL COURSE: This is a 39 year old male who presented to the hospital with a headache and left-sided weakness and suspected to have a possible CVA.   Problem #1.  Left-sided weakness and headache. The initial suspicion on admission was a possible stroke. The patient underwent a CT head which was negative. He does have risk factors given his history of ongoing tobacco abuse, hyperlipidemia, and hypertension. An MRI was ordered, but the patient did not want to get it done as he was quite claustrophobic and wanted to be completely sedated. We did get a  neurology consult. The patient was seen by neurology. As per them, he had no focal deficits concerning for a possible stroke. Given a normal neurological exam and a negative CT head, he did not require any further imaging. His carotid duplex and his echocardiogram were normal and since his symptoms have resolved, he was discharged home.  Problem #2.  Headache. This was likely related to his infected tooth. The patient has had a left-sided infected molar for weeks.  He has taken several rounds of antibiotics and pain medications, but has not improved. He likely needs to get the tooth extracted. He apparently has no dental insurance and could not afford it. Social work has made an appointment with the [facility] for him to get this taken care of. For now, he will continue supportive care for his headache with pain medications.  Problem #3.  Hypertension.  The patient remained hemodynamically stable. He will continue his Diovan, hydrochlorothiazide.  Problem #4.  GERD.  The patient was maintain on his Protonix. He will resume that.  Problem #5.  Hyperlipidemia. The patient's total cholesterol was high over 200, questionable if he is compliant with his simvastatin, although he will continue that for now.   CODE STATUS: The patient is a full code.   TIME SPENT ON DISCHARGE: 35 minutes.    ____________________________ Belia Heman. Verdell Carmine, MD vjs:LT D: 11/10/2014 63:14:97 ET T: 11/10/2014 19:01:50 ET JOB#: [id number]  cc: Belia Heman. Verdell Carmine, MD, <Dictator> Henreitta Leber MD ELECTRONICALLY SIGNED 11/19/2014 10:46

## 2015-03-14 NOTE — Discharge Summary (Signed)
PATIENT NAME:  Caleb [name], Caleb [name] MR#:  [id number] DATE OF BIRTH:  1976-07-02  DATE OF ADMISSION:  05/07/2012 DATE OF DISCHARGE:  05/08/2012  PRIMARY CARE PHYSICIAN: [facility]  DISCHARGE DIAGNOSES:  1. Angioedema, possibly due to bee sting, less likely angiotensin-converting-enzyme inhibitor  (ACE) as the  patient has been on it for at least the last two years; although it could be possible, now improved with steroids, initiating protein pump inhibitor and Benadryl. He will require EpiPen handy.  2. Hypoglycemia, could be due to steroids. Further work-up per primary care physician.  3. Tobacco dependence, was counseled on admission.  4. Hypertension. Lisinopril stopped now and he started on Norvasc.  5. Hyperlipidemia, on simvastatin.   SECONDARY DIAGNOSES:  1. Hypertension.  2. Hyperlipidemia.  3. Gastroesophageal reflux disease.  4. Chronic insomnia.   CONSULTATIONS: None.   PROCEDURES/RADIOLOGY: None.   HISTORY AND SHORT HOSPITAL COURSE: The patient is a 39 year old male with the above-mentioned medical problems, was admitted for angioedema. Initially it was thought to be due to ACE inhibitor, although on further discussion with the patient it seemed to be related to bee stings as the patient was out in the yard and remembers having possible bee stings, although he was also on ACE inhibitors so it cannot be excluded either and  has been added to his allergy list for the time being and the medication has also been stopped. He was started on new blood pressure medication in the form of Norvasc and was doing very well on steroids, Benadryl, and ranitidine. He is being discharged home in stable condition.   PERTINENT DISCHARGE PHYSICAL EXAMINATION:  VITAL SIGNS: On the date of discharge, his vital signs are as follows: Temperature 96.2, heart rate 88 per minute, respirations 18 per minute, blood pressure 142/80 mmHg.  He was saturating 97% on room air. CARDIOVASCULAR: S1, S2  normal. No murmurs, rubs, or gallop. LUNGS: Clear to auscultation bilaterally. No wheezing, rales, rhonchi, or crepitation. ABDOMEN: Soft, benign. NEUROLOGIC: Nonfocal examination. All other physical examination remained at the baseline.   DISCHARGE MEDICATIONS:  1. Nexium 40 mg p.o. daily.  2. Simvastatin 40 mg p.o. daily.  3. Melatonin 1 tablet p.o. at bedtime.  4. Meloxicam 7.5 mg p.o. daily.  5. Benadryl 25 mg p.o. every 4 hours as needed.  6. Gabapentin 300 mg, 2 capsules p.o. at bedtime.  7. Amlodipine 10 mg p.o. daily.  8. Nicotine one patch transdermal 21 mg daily.   DISCHARGE DIET: Low sodium.   DISCHARGE ACTIVITY: As tolerated.   DISCHARGE INSTRUCTIONS AND FOLLOW-UP:  1. The patient was instructed to stop taking lisinopril.  2. He will need follow-up with his primary care physician at [facility] in 1 to 2 weeks.  3. The patient's wife requested a work excuse which was given for her for the patient's stay in the hospital as she was taking care of him while in the hospital.    TOTAL TIME DISCHARGING THIS PATIENT: 45 minutes.  ____________________________ Lucina Mellow. Manuella Ghazi, MD vss:cbb D: 05/08/2012 21:52:16 ET T: 05/09/2012 10:48:01 ET JOB#: [id number]  cc: Amram Maya S. Manuella Ghazi, MD, <Dictator> [place] MD ELECTRONICALLY SIGNED 05/10/2012 18:57

## 2015-03-14 NOTE — H&P (Signed)
PATIENT NAME:  Caleb [name], DECICCO MR#:  [id number] DATE OF BIRTH:  11/07/1976  DATE OF ADMISSION:  05/07/2012  PRIMARY CARE PHYSICIAN: Unknown.   CHIEF COMPLAINT: Angioedema.   HISTORY OF PRESENT ILLNESS: This is a 39 year old male with a history of hypertension, hyperlipidemia, gastroesophageal reflux disease, and insomnia who presents with the above complaint. The patient woke up this morning with his lip feeling very swollen and he felt like his throat was closing. He was having difficulty breathing. He was brought here emergently where he received epinephrine, Solu-Medrol, Benadryl, and ranitidine. He does not appear to have any acute respiratory distress at this time. The patient denies any new medications, new environmental exposures, and no new changes in his lifestyle. No fever or chills, nausea or vomiting, shortness of breath or dyspnea on exertion. He is currently taking an ACE inhibitor, which has been taking for several years.   REVIEW OF SYSTEMS: CONSTITUTIONAL: No fever, fatigue, or weakness. EYES: No blurred vision or glaucoma or cataracts. ENT: No ear pain, hearing loss, seasonal allergies, or postnasal drip. Positive snoring. RESPIRATORY: No cough, wheezing, hemoptysis, dyspnea, or chronic obstructive pulmonary disease. No pneumonia. CARDIOVASCULAR: Denies chest pain, orthopnea, palpitations, or syncope. GASTROINTESTINAL: No nausea, vomiting, diarrhea, abdominal pain, melena, or ulcers. GENITOURINARY: No dysuria or hematuria. ENDOCRINE: No nocturia or thyroid problems. HEME/LYMPH: No anemia or easy bruising. SKIN: No rashes or lesions. MUSCULOSKELETAL: No limited activity. NEUROLOGIC: No history of cerebrovascular accident, transient ischemic attack, or seizures. PSYCH: No history of anxiety or depression.   PAST MEDICAL HISTORY:  1. Hypertension.  2. Hyperlipidemia.  3. Gastroesophageal reflux disease. 4. Insomnia.   MEDICATIONS:  1. Simvastatin. 2. Lisinopril, unknown  dose. 3. Nexium 40 mg daily.  4. Meloxicam, unknown dose.   ALLERGIES: Intravenous pyelogram dye, bee stings, metformin, and now likely ACE inhibitor.   SOCIAL HISTORY: The patient smokes 1-1/2 packs a day. No IV drug use or alcohol.   FAMILY HISTORY: Positive for diabetes, hypertension, and hyperlipidemia.   PAST SURGICAL HISTORY:  1. Hernia repair.  2. Tonsillectomy. 3. Knee surgery.   PHYSICAL EXAMINATION:   VITAL SIGNS: Temperature 97.2, pulse 80, respirations 20, blood pressure 150/86, and 96% on room air.   GENERAL: The patient is alert and oriented, not in acute distress.  HEENT: Head is atraumatic. Pupils are round and reactive. Sclerae anicteric. Moist mucous membranes. Tongue is slightly swollen but does not seem to be encroaching his throat causing airway distress.  NECK: Very short and fat. Hard to appreciate enlarged thyroid. No appreciable carotid bruit.   CARDIOVASCULAR: Regular rate and rhythm. No murmurs, gallops, or rubs. PMI is not lateralized.  LUNGS: Clear to auscultation without crackles, rales, rhonchi, or wheezing. Normal to percussion.  BACK: No costovertebral angle tenderness or vertebral tenderness.   ABDOMEN: Obese. Bowel sounds are positive. Nontender. Hard to appreciate organomegaly due to body habitus.   EXTREMITIES: No clubbing, cyanosis or edema.   SKIN: No rash or lesions.   NEURO: Cranial nerves II through XII grossly intact. No focal deficits.   LABS: White blood cells 9.1, hemoglobin 17.4, hematocrit 51, and platelets 227. Sodium 138, potassium 3.6, chloride 103, bicarbonate 25, BUN 11, creatinine 0.81, glucose 135, alkaline phosphatase 90, ALT 75, AST 30, total protein 7.4, and albumin 3.8.  Calcium 9.0.  ASSESSMENT AND PLAN: The patient is a 39 year old male who is on an ACE inhibitor who presents with angioedema.  1. Angioedema. I suspect this could be due to ACE inhibitor use. We will obviously  discontinue the ACE inhibitor and put  that as an allergy. We will continue with steroids, PPI, and Ranitidine as well as Benadryl. The patient will need EpiPen at discharge.  2. Hyperglycemia. We will repeat a BMP in the a.m. Further work-up pending tomorrows labs 3. Tobacco dependence. The patient was counseled for three minutes. We encouraged him to stop smoking. We will place a nicotine patch.  4. Hypertension. We will discontinue lisinopril and add Norvasc instead. Add lisinopril to allergy list.  5. Hyperlipidemia. Continue simvastatin. For now will be 10 mg at bedtime. Pharmacy is helping [place] with his medication.   CODE STATUS: The patient is FULL CODE status.  TIME SPENT: Approximately 50 minutes. ____________________________ Donell Beers. Benjie Karvonen, MD spm:slb D: 05/07/2012 08:37:41 ET    T: 05/07/2012 09:57:31 ET        JOB#: [id number] cc: An Lannan P. Benjie Karvonen, MD, <Dictator> Donell Beers Jennipher Weatherholtz MD ELECTRONICALLY SIGNED 05/07/2012 13:52

## 2015-03-17 NOTE — H&P (Signed)
PATIENT NAME:  Caleb [name], Caleb [name] MR#:  [id number] DATE OF BIRTH:  1976/01/02  PRIMARY CARE PROVIDER: In [facility]  CHIEF COMPLAINT: Left arm weakness, bilateral leg weakness, headache.   HISTORY OF PRESENT ILLNESS: The patient is a 39 year old white male with history of hypertension, hyperlipidemia, GERD, insomnia, history of PE, post knee surgery, migraines, posttraumatic stress disorder, who states that he had a stressful day earlier today and then started having left arm weakness earlier today and bilateral leg weakness. He reports that he has had symptoms like this before, but today was severe.  The patient also complains of having a cough and that has been going on for the past few weeks. He does smoke heavily. The patient otherwise denies any numbness or tingling. He complains of a sharp frontal headache, which is similar to his migraine. He also complains that he has been having some intermittent blurred vision. He denies any fevers, chills, complaints of dry heaving. He had a CT scan of the head, which was negative in the ED.   PAST MEDICAL HISTORY:  1.  Hypertension.  2.  Hyperlipidemia.  3.  GERD.  4.  History of insomnia.  5.  PTSD.  6.  History of migraines.  7.  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   PAST SURGICAL HISTORY: Status post left knee surgery, tonsillectomy, nasal polyp, inguinal hernia repair.   CURRENT MEDICATIONS AT HOME: Simvastatin 40 daily, oxycodone 15 q. 6 p.r.n., Nexium 40 daily, hydrochlorothiazide, losartan 25/100 daily, Carafate 1 gram 4 times a day, Benadryl Allergy 25 mg 2 tablets q. 6 hours.   SOCIAL HISTORY: Smokes about 1-1/2-2 packs per day. No IV drug use.   ALLERGIES: INTRAVENOUS CT DYE, IVP DYE, LISINOPRIL, METFORMIN.   FAMILY HISTORY: Positive for diabetes, hypertension, hyperlipidemia.   REVIEW OF SYSTEMS:  CONSTITUTIONAL: Denies any fevers. Complains of some fatigue, weakness. No pain. No weight loss. No weight  gain.  EYES: Complains of double blurred vision with his headaches. No inflammation. No glaucoma. No cataracts.  EARS, NOSE, THROAT: No tinnitus. No ear pain. No hearing loss. No seasonal or year-round allergies. No epistaxis. No difficulty swallowing.  RESPIRATORY: Denies any cough, wheezing, hemoptysis. No COPD.  CARDIOVASCULAR: Denies any chest pain, orthopnea, edema.  GASTROINTESTINAL: No nausea, vomiting, diarrhea. No abdominal pain. Has a history of GERD.  GENITOURINARY: Denies any dysuria, hematuria, renal calculus, or frequency.  ENDOCRINE: Denies any polyuria or nocturia. HEMATOLOGIC AND LYMPHATIC: Denies anemia, easy bruisability, or bleeding.  MUSCULOSKELETAL:  Denies any gout.  NEUROLOGIC: Complains of left-sided arm weakness, bilateral lower extremity weakness. Has a history of migraines.  PSYCHIATRIC: Has posttraumatic stress disorder according to him, insomnia.   PHYSICAL EXAMINATION: VITAL SIGNS: Temperature 99.5, pulse 105, respirations 28, blood pressure 163/92.  GENERAL: The patient is an obese male in no acute distress.  HEENT: Head atraumatic, normocephalic. Pupils equally round, reactive to light and accommodation. There is no conjunctivae pallor. No sclerae icterus. Nasal exam shows no drainage or ulceration. Oropharynx is clear without any exudate.  NECK: Supple without any thyromegaly.  CARDIOVASCULAR: Regular rate and rhythm. No murmurs, rubs, clicks, or gallops.  LUNGS: Clear to auscultation bilaterally without any rales, rhonchi, wheezing.  ABDOMEN: Soft, nontender, nondistended. Positive bowel sounds x 4.  EXTREMITIES: No clubbing, cyanosis, or edema.  SKIN: No rash.  LYMPH NODES: Nonpalpable.  MUSCULOSKELETAL: There is no erythema or swelling.  NEUROLOGIC: He has some subjective left-sided weakness in the upper extremities, but I am not really  convinced that he has weakness. Reflexes 2+. Babinski's downgoing.  PSYCHIATRIC: Not anxious or depressed.    LABORATORY DATA: Glucose 97, BUN 9, creatinine 0.96, sodium 137, potassium 3.2, chloride 103, CO2 of 28, calcium 9.3. LFTs are normal except ALT of 66. WBC 11.1, hemoglobin 18.8, platelet count 235,000, INR 1.0.   IMAGING:  CT scan of the head without contrast showed ethmoid sinus disease bilaterally. No mass. No hemorrhage. Chest x-ray showed no acute cardiopulmonary disease.   ASSESSMENT AND PLAN: The patient is a 39 year old white male with history of migraines, diabetes, hypertension, hyperlipidemia, presents with headache and left-sided weakness.  1.  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 The patient reports that he will not be able to lay in MRI without significant medications at this time. I will ask neurology to see, try to control his headache, place him on aspirin. We will check carotid Dopplers and echocardiogram. I do not feel that this is cerebrovascular accident.  2.  Hypertension. Continue hydrochlorothiazide, losartan.  3.  Hypokalemia. Replace his potassium, likely due to hydrochlorothiazide therapy.  4.  Hyperlipidemia. Continue simvastatin, fasting lipid panel in the a.m.  5.  Gastroesophageal reflux disease. The patient will be continued on sucralfate.  6.  Cough, possible bronchitis. We will treat him with Mucinex, Combivent, and Advair. I am going to also start him on antibiotics due to significant sinusitis also noted on his CT scan which will also help with his cough.  7.  Miscellaneous. The patient will be on Lovenox for deep vein thrombosis prophylaxis.   TIME SPENT ON THIS PATIENT: 50 minutes.    ____________________________ Lafonda Mosses Posey Pronto, MD shp:LT D: 11/09/2014 17:22:34 ET T: 11/09/2014 17:56:42 ET JOB#: [id number]  cc: Ilan Kahrs H. Posey Pronto, MD, <Dictator> Alric Seton MD ELECTRONICALLY SIGNED 11/22/2014 12:14

## 2015-04-30 ENCOUNTER — Other Ambulatory Visit: Payer: Self-pay

## 2015-04-30 ENCOUNTER — Emergency Department
Admission: EM | Admit: 2015-04-30 | Discharge: 2015-04-30 | Disposition: A | Discharge status: Home / Self Care | Payer: Medicare HMO | Attending: Emergency Medicine | Admitting: Emergency Medicine

## 2015-04-30 ENCOUNTER — Encounter: Payer: Self-pay | Admitting: Emergency Medicine

## 2015-04-30 ENCOUNTER — Emergency Department: Payer: Medicare HMO

## 2015-04-30 DIAGNOSIS — R109 Unspecified abdominal pain: Secondary | ICD-10-CM | POA: Insufficient documentation

## 2015-04-30 DIAGNOSIS — E876 Hypokalemia: Secondary | ICD-10-CM | POA: Diagnosis not present

## 2015-04-30 DIAGNOSIS — I10 Essential (primary) hypertension: Secondary | ICD-10-CM | POA: Insufficient documentation

## 2015-04-30 DIAGNOSIS — Z79899 Other long term (current) drug therapy: Secondary | ICD-10-CM | POA: Diagnosis not present

## 2015-04-30 DIAGNOSIS — Z720 Tobacco use: Secondary | ICD-10-CM | POA: Insufficient documentation

## 2015-04-30 DIAGNOSIS — R350 Frequency of micturition: Secondary | ICD-10-CM | POA: Insufficient documentation

## 2015-04-30 DIAGNOSIS — R112 Nausea with vomiting, unspecified: Secondary | ICD-10-CM | POA: Diagnosis not present

## 2015-04-30 HISTORY — DX: Gastro-esophageal reflux disease without esophagitis: K21.9

## 2015-04-30 HISTORY — DX: Pain in right knee: M25.561

## 2015-04-30 HISTORY — DX: Essential (primary) hypertension: I10

## 2015-04-30 HISTORY — DX: Pure hypercholesterolemia, unspecified: E78.00

## 2015-04-30 HISTORY — DX: Pain in right knee: M25.562

## 2015-04-30 HISTORY — DX: Post-traumatic stress disorder, unspecified: F43.10

## 2015-04-30 LAB — BASIC METABOLIC PANEL
ANION GAP: 11 (ref 5–15)
BUN: 6 mg/dL (ref 6–20)
CHLORIDE: 103 mmol/L (ref 101–111)
CO2: 23 mmol/L (ref 22–32)
CREATININE: 0.9 mg/dL (ref 0.61–1.24)
Calcium: 9 mg/dL (ref 8.9–10.3)
GFR calc Af Amer: >60 mL/min (ref >60)
Glucose, Bld: 218 mg/dL — ABNORMAL HIGH (ref 65–99)
Potassium: 2.3 mmol/L — CL (ref 3.5–5.1)
Sodium: 137 mmol/L (ref 135–145)

## 2015-04-30 LAB — URINALYSIS COMPLETE WITH MICROSCOPIC (ARMC ONLY)
Bacteria, UA: NONE SEEN
Bilirubin Urine: NEGATIVE
Glucose, UA: >500 mg/dL — AB
Hgb urine dipstick: NEGATIVE
Ketones, ur: NEGATIVE mg/dL
Leukocytes, UA: NEGATIVE
Nitrite: NEGATIVE
Protein, ur: NEGATIVE mg/dL
RBC / HPF: NONE SEEN RBC/hpf (ref 0–5)
SPECIFIC GRAVITY, URINE: 1.01 (ref 1.005–1.030)
Squamous Epithelial / LPF: NONE SEEN
pH: 6 (ref 5.0–8.0)

## 2015-04-30 LAB — CBC
HCT: 49.7 % (ref 40.0–52.0)
Hemoglobin: 17.2 g/dL (ref 13.0–18.0)
MCH: 32.9 pg (ref 26.0–34.0)
MCHC: 34.7 g/dL (ref 32.0–36.0)
MCV: 94.7 fL (ref 80.0–100.0)
Platelets: 191 10*3/uL (ref 150–440)
RBC: 5.25 MIL/uL (ref 4.40–5.90)
RDW: 13.5 % (ref 11.5–14.5)
WBC: 8.8 10*3/uL (ref 3.8–10.6)

## 2015-04-30 LAB — TROPONIN I: Troponin I: <0.03 ng/mL (ref <0.031)

## 2015-04-30 MED ORDER — DIAZEPAM 5 MG/ML IJ SOLN
2.5000 mg | Freq: Once | INTRAMUSCULAR | Status: AC
  Administered 2015-04-30: 2.5 mg via INTRAVENOUS

## 2015-04-30 MED ORDER — KETOROLAC TROMETHAMINE 30 MG/ML IJ SOLN
INTRAMUSCULAR | Status: AC
  Filled 2015-04-30: qty 1

## 2015-04-30 MED ORDER — ONDANSETRON HCL 4 MG/2ML IJ SOLN
4.0000 mg | Freq: Once | INTRAMUSCULAR | Status: AC
  Administered 2015-04-30: 4 mg via INTRAVENOUS

## 2015-04-30 MED ORDER — HYDROMORPHONE HCL 1 MG/ML IJ SOLN
INTRAMUSCULAR | Status: AC
Start: 2015-04-30 — End: 2015-04-30
  Administered 2015-04-30: 1 mg via INTRAVENOUS
  Filled 2015-04-30: qty 1

## 2015-04-30 MED ORDER — KETOROLAC TROMETHAMINE 30 MG/ML IJ SOLN
30.0000 mg | Freq: Once | INTRAMUSCULAR | Status: AC
  Administered 2015-04-30: 30 mg via INTRAVENOUS

## 2015-04-30 MED ORDER — POTASSIUM CHLORIDE CRYS ER 20 MEQ PO TBCR
60.0000 meq | EXTENDED_RELEASE_TABLET | Freq: Once | ORAL | Status: AC
  Administered 2015-04-30: 60 meq via ORAL

## 2015-04-30 MED ORDER — HYDROMORPHONE HCL 1 MG/ML IJ SOLN
1.0000 mg | Freq: Once | INTRAMUSCULAR | Status: AC
  Administered 2015-04-30: 1 mg via INTRAVENOUS

## 2015-04-30 MED ORDER — ONDANSETRON HCL 4 MG/2ML IJ SOLN
INTRAMUSCULAR | Status: AC
Start: 2015-04-30 — End: 2015-04-30
  Administered 2015-04-30: 4 mg via INTRAVENOUS
  Filled 2015-04-30: qty 2

## 2015-04-30 MED ORDER — DIAZEPAM 5 MG/ML IJ SOLN
INTRAMUSCULAR | Status: DC
Start: 2015-04-30 — End: 2015-04-30
  Filled 2015-04-30: qty 2

## 2015-04-30 MED ORDER — POTASSIUM CHLORIDE CRYS ER 20 MEQ PO TBCR
EXTENDED_RELEASE_TABLET | ORAL | Status: AC
  Filled 2015-04-30: qty 3

## 2015-04-30 NOTE — ED Provider Notes (Signed)
[facility] Emergency Department Provider Note  ____________________________________________  Time seen: Approximately 2:15 AM  I have reviewed the triage vital signs and the nursing notes.   HISTORY  Chief Complaint Chest Pain; Shortness of Breath; and Morning Sickness    HPI Caleb [name] is a 39 y.o. male who presents via EMS for a two-week history of bilateral flank/rib pain. Patient denies trauma/injury. Patient reports productive cough with shortness of breath 3 days. Patient reports occasional nausea/vomiting times one week; denies diarrhea. Patient reports increased urinary frequency and dark urine. Past history of kidney stones but states this feels different. Patient is followed by the [place] [facility]. Denies fever, chills, headache, weakness, numbness, tingling.Nothing makes the pain feel better or worse. Denies recent travel, surgery or hormone use.   Past Medical History  Diagnosis Date  . Borderline diabetes   . Hypertension   . Hypercholesteremia   . GERD (gastroesophageal reflux disease)   . Knee pain, bilateral   . PTSD (post-traumatic stress disorder)     There are no active problems to display for this patient.   Past Surgical History  Procedure Laterality Date  . Knee surgeries      Current Outpatient Rx  Name  Route  Sig  Dispense  Refill  . aspirin EC 81 MG tablet   Oral   Take 1 tablet by mouth daily.         Marland Kitchen atorvastatin (LIPITOR) 80 MG tablet   Oral   Take 1 tablet by mouth daily.      3   . diclofenac sodium (VOLTAREN) 1 % GEL   Topical   Apply 2 g topically 4 (four) times daily.         Marland Kitchen losartan-hydrochlorothiazide (HYZAAR) 100-25 MG per tablet   Oral   Take 1 tablet by mouth daily.      11   . oxyCODONE-acetaminophen (PERCOCET/ROXICET) 5-325 MG per tablet   Oral   Take 1 tablet by mouth every 4 (four) hours as needed for severe pain.          . pantoprazole (PROTONIX) 40 MG  tablet   Oral   Take 1 tablet by mouth daily.           Allergies Bee pollen; Contrast media; and Metformin and related  History reviewed. No pertinent family history.  Social History History  Substance Use Topics  . Smoking status: Current Every Day Smoker -- 1.00 packs/day    Types: Cigarettes  . Smokeless tobacco: Never Used  . Alcohol Use: 2.4 oz/week    4 Cans of beer per week    Review of Systems Constitutional: No fever/chills Eyes: No visual changes. ENT: No sore throat. Cardiovascular: Denies chest pain. Respiratory: Denies shortness of breath. Gastrointestinal: Positive for abdominal pain. Positive for intermittent nausea and vomiting.  No diarrhea.  No constipation. Genitourinary: Positive for urinary frequency. Musculoskeletal: Positive for flank pain. Skin: Negative for rash. Neurological: Negative for headaches, focal weakness or numbness.  10-point ROS otherwise negative.  ____________________________________________   PHYSICAL EXAM:  VITAL SIGNS: ED Triage Vitals  Enc Vitals Group     BP 04/30/15 0117 166/102 mmHg     Pulse Rate 04/30/15 0117 89     Resp 04/30/15 0117 24     Temp 04/30/15 0117 98.4 F (36.9 C)     Temp Source 04/30/15 0117 Oral     SpO2 04/30/15 0117 98 %     Weight 04/30/15 0117 255 lb (115.667  kg)     Height 04/30/15 0117 6' (1.829 m)     Head Cir --      Peak Flow --      Pain Score 04/30/15 0117 8     Pain Loc --      Pain Edu? --      Excl. in [place]? --     Constitutional: Alert and oriented. Well appearing and in mild acute distress. Eyes: Conjunctivae are normal. PERRL. EOMI. Head: Atraumatic. Nose: No congestion/rhinnorhea. Mouth/Throat: Mucous membranes are moist.  Oropharynx non-erythematous. Neck: No stridor.   Cardiovascular: Normal rate, regular rhythm. Grossly normal heart sounds.  Good peripheral circulation. Respiratory: Normal respiratory effort.  No retractions. Lungs CTAB. Gastrointestinal: Soft and  nontender. No distention. No abdominal bruits. Bilateral mild CVA tenderness. Musculoskeletal: Lower back spasms bilaterally. No lower extremity tenderness nor edema.  No joint effusions. Neurologic:  Normal speech and language. No gross focal neurologic deficits are appreciated. Speech is normal. No gait instability. Skin:  Skin is warm, dry and intact. No rash noted. Psychiatric: Mood and affect are normal. Speech and behavior are normal.  ____________________________________________   LABS (all labs ordered are listed, but only abnormal results are displayed)  Labs Reviewed  BASIC METABOLIC PANEL - Abnormal; Notable for the following:    Potassium 2.3 (*)    Glucose, Bld 218 (*)    All other components within normal limits  URINALYSIS COMPLETEWITH MICROSCOPIC ([facility] ONLY) - Abnormal; Notable for the following:    Color, Urine STRAW (*)    APPearance CLEAR (*)    Glucose, UA >500 (*)    All other components within normal limits  CBC  TROPONIN I   ____________________________________________  EKG  ED ECG REPORT I, SUNG,JADE J, the attending physician, personally viewed and interpreted this ECG.   Date: 04/30/2015  EKG Time: 0132  Rate: 88  Rhythm: normal EKG, normal sinus rhythm  Axis: Normal  Intervals:none  ST&T Change: Nonspecific  ____________________________________________  RADIOLOGY  Portable chest x-ray (viewed by me, interpreted by Dr. Alroy Dust): No active disease.  CT renal stone study interpreted per Dr. Alroy Dust: No significant abnormality. ____________________________________________   PROCEDURES  Procedure(s) performed: None  Critical Care performed: No  ____________________________________________   INITIAL IMPRESSION / ASSESSMENT AND PLAN / ED COURSE  Pertinent labs & imaging results that were available during my care of the patient were reviewed by me and considered in my medical decision making (see chart for details).  39 year old  male with a two-week history of bilateral flank pain, 3 day history of productive cough. Plan for IV analgesia, chest x-ray, renal protocol CT. Will replete potassium in the ED.  ----------------------------------------- 4:44 AM on 04/30/2015 -----------------------------------------  Patient improved. Discussed with patient and family about rechecking his potassium. Patient states he has known low potassium and is scheduled to follow-up with his doctor this morning. Patient declines to wait for redraw and requests discharge to follow-up with his doctor today. Strict return precautions given. Patient and family verbalize understanding and agree with plan of care. ____________________________________________   FINAL CLINICAL IMPRESSION(S) / ED DIAGNOSES  Final diagnoses:  Hypokalemia  Flank pain      Paulette Blanch, MD 04/30/15 [phone]

## 2015-04-30 NOTE — Discharge Instructions (Signed)
Please call your doctor today to follow-up on your low potassium level. Return to the ER for worsening symptoms, persistent vomiting, difficulty breathing or other concerns.  Flank Pain Flank pain refers to pain that is located on the side of the body between the upper abdomen and the back. The pain may occur over a short period of time (acute) or may be long-term or reoccurring (chronic). It may be mild or severe. Flank pain can be caused by many things. CAUSES  Some of the more common causes of flank pain include:  Muscle strains.   Muscle spasms.   A disease of your spine (vertebral disk disease).   A lung infection (pneumonia).   Fluid around your lungs (pulmonary edema).   A kidney infection.   Kidney stones.   A very painful skin rash caused by the chickenpox virus (shingles).   Gallbladder disease.  [place] care will depend on the cause of your pain. In general,  Rest as directed by your caregiver.  Drink enough fluids to keep your urine clear or pale yellow.  Only take over-the-counter or prescription medicines as directed by your caregiver. Some medicines may help relieve the pain.  Tell your caregiver about any changes in your pain.  Follow up with your caregiver as directed. SEEK IMMEDIATE MEDICAL CARE IF:   Your pain is not controlled with medicine.   You have new or worsening symptoms.  Your pain increases.   You have abdominal pain.   You have shortness of breath.   You have persistent nausea or vomiting.   You have swelling in your abdomen.   You feel faint or pass out.   You have blood in your urine.  You have a fever or persistent symptoms for more than 2-3 days.  You have a fever and your symptoms suddenly get worse. MAKE SURE YOU:   Understand these instructions.  Will watch your condition.  Will get help right away if you are not doing well or get worse. Document Released: 12/28/2005 Document  Revised: 07/31/2012 Document Reviewed: 06/20/2012 [facility] Patient Information 2015 [place], [place]. This information is not intended to replace advice given to you by your health care provider. Make sure you discuss any questions you have with your health care provider.  Hypokalemia Hypokalemia means that the amount of potassium in the blood is lower than normal.Potassium is a chemical, called an electrolyte, that helps regulate the amount of fluid in the body. It also stimulates muscle contraction and helps nerves function properly.Most of the body's potassium is inside of cells, and only a very small amount is in the blood. Because the amount in the blood is so small, minor changes can be life-threatening. CAUSES  Antibiotics.  Diarrhea or vomiting.  Using laxatives too much, which can cause diarrhea.  Chronic kidney disease.  Water pills (diuretics).  Eating disorders (bulimia).  Low magnesium level.  Sweating a lot. SIGNS AND SYMPTOMS  Weakness.  Constipation.  Fatigue.  Muscle cramps.  Mental confusion.  Skipped heartbeats or irregular heartbeat (palpitations).  Tingling or numbness. DIAGNOSIS  Your health care provider can diagnose hypokalemia with blood tests. In addition to checking your potassium level, your health care provider may also check other lab tests. TREATMENT Hypokalemia can be treated with potassium supplements taken by mouth or adjustments in your current medicines. If your potassium level is very low, you may need to get potassium through a vein (IV) and be monitored in the hospital. A diet high  in potassium is also helpful. Foods high in potassium are:  Nuts, such as peanuts and pistachios.  Seeds, such as sunflower seeds and pumpkin seeds.  Peas, lentils, and lima beans.  Whole grain and bran cereals and breads.  Fresh fruit and vegetables, such as apricots, avocado, bananas, cantaloupe, kiwi, oranges, tomatoes, asparagus, and  potatoes.  Orange and tomato juices.  Red meats.  Fruit yogurt. HOME CARE INSTRUCTIONS  Take all medicines as prescribed by your health care provider.  Maintain a healthy diet by including nutritious food, such as fruits, vegetables, nuts, whole grains, and lean meats.  If you are taking a laxative, be sure to follow the directions on the label. SEEK MEDICAL CARE IF:  Your weakness gets worse.  You feel your heart pounding or racing.  You are vomiting or having diarrhea.  You are diabetic and having trouble keeping your blood glucose in the normal range. SEEK IMMEDIATE MEDICAL CARE IF:  You have chest pain, shortness of breath, or dizziness.  You are vomiting or having diarrhea for more than 2 days.  You faint. MAKE SURE YOU:   Understand these instructions.  Will watch your condition.  Will get help right away if you are not doing well or get worse. Document Released: 11/06/2005 Document Revised: 08/27/2013 Document Reviewed: 05/09/2013 [facility] Patient Information 2015 [place], [place]. This information is not intended to replace advice given to you by your health care provider. Make sure you discuss any questions you have with your health care provider.

## 2015-04-30 NOTE — ED Notes (Signed)
Pt to rm 8 via EMS, reports bilateral flank/rib pain x 2 weeks.  Pt reports SOB and productive cough x 3 days.  Pt reports n/v x 1 week.  Pt reports vomit 2x in past 24 hours.  Pt reports increased urination and dark urine.  Pt dx with borderline diabetes in past.  Pt tachypneic upon arrival but o2sat 95%.

## 2015-06-21 DIAGNOSIS — G43409 Hemiplegic migraine, not intractable, without status migrainosus: Secondary | ICD-10-CM | POA: Insufficient documentation

## 2015-06-21 DIAGNOSIS — G4733 Obstructive sleep apnea (adult) (pediatric): Secondary | ICD-10-CM | POA: Insufficient documentation

## 2015-08-13 ENCOUNTER — Encounter: Payer: Self-pay | Admitting: Emergency Medicine

## 2015-08-13 ENCOUNTER — Emergency Department
Admission: EM | Admit: 2015-08-13 | Discharge: 2015-08-14 | Disposition: A | Discharge status: Home / Self Care | Payer: Medicare HMO | Attending: Emergency Medicine | Admitting: Emergency Medicine

## 2015-08-13 ENCOUNTER — Emergency Department: Payer: Medicare HMO

## 2015-08-13 DIAGNOSIS — Y9289 Other specified places as the place of occurrence of the external cause: Secondary | ICD-10-CM | POA: Diagnosis not present

## 2015-08-13 DIAGNOSIS — I10 Essential (primary) hypertension: Secondary | ICD-10-CM | POA: Diagnosis not present

## 2015-08-13 DIAGNOSIS — Y9389 Activity, other specified: Secondary | ICD-10-CM | POA: Insufficient documentation

## 2015-08-13 DIAGNOSIS — W172XXA Fall into hole, initial encounter: Secondary | ICD-10-CM | POA: Insufficient documentation

## 2015-08-13 DIAGNOSIS — G8929 Other chronic pain: Secondary | ICD-10-CM | POA: Insufficient documentation

## 2015-08-13 DIAGNOSIS — Z720 Tobacco use: Secondary | ICD-10-CM | POA: Insufficient documentation

## 2015-08-13 DIAGNOSIS — Z791 Long term (current) use of non-steroidal anti-inflammatories (NSAID): Secondary | ICD-10-CM | POA: Diagnosis not present

## 2015-08-13 DIAGNOSIS — Z79899 Other long term (current) drug therapy: Secondary | ICD-10-CM | POA: Diagnosis not present

## 2015-08-13 DIAGNOSIS — M25562 Pain in left knee: Secondary | ICD-10-CM

## 2015-08-13 DIAGNOSIS — S8992XA Unspecified injury of left lower leg, initial encounter: Secondary | ICD-10-CM | POA: Insufficient documentation

## 2015-08-13 DIAGNOSIS — Z7982 Long term (current) use of aspirin: Secondary | ICD-10-CM | POA: Diagnosis not present

## 2015-08-13 DIAGNOSIS — Y990 Civilian activity done for income or pay: Secondary | ICD-10-CM | POA: Insufficient documentation

## 2015-08-13 MED ORDER — KETOROLAC TROMETHAMINE 30 MG/ML IJ SOLN
30.0000 mg | Freq: Once | INTRAMUSCULAR | Status: AC
  Administered 2015-08-13: 30 mg via INTRAMUSCULAR

## 2015-08-13 MED ORDER — KETOROLAC TROMETHAMINE 30 MG/ML IJ SOLN
INTRAMUSCULAR | Status: AC
  Administered 2015-08-13: 30 mg via INTRAMUSCULAR
  Filled 2015-08-13: qty 1

## 2015-08-13 MED ORDER — HYDROMORPHONE HCL 1 MG/ML IJ SOLN
INTRAMUSCULAR | Status: AC
  Administered 2015-08-13: 1 mg via INTRAMUSCULAR
  Filled 2015-08-13: qty 1

## 2015-08-13 MED ORDER — OXYCODONE-ACETAMINOPHEN 5-325 MG PO TABS
ORAL_TABLET | ORAL | Status: AC
  Administered 2015-08-13: 1 via ORAL
  Filled 2015-08-13: qty 1

## 2015-08-13 MED ORDER — HYDROMORPHONE HCL 1 MG/ML IJ SOLN
1.0000 mg | Freq: Once | INTRAMUSCULAR | Status: AC
  Administered 2015-08-13: 1 mg via INTRAMUSCULAR

## 2015-08-13 MED ORDER — OXYCODONE-ACETAMINOPHEN 5-325 MG PO TABS
1.0000 | ORAL_TABLET | Freq: Once | ORAL | Status: AC
  Administered 2015-08-13: 1 via ORAL

## 2015-08-13 NOTE — Discharge Instructions (Signed)
Knee Pain Knee pain can be a result of an injury or other medical conditions. Treatment will depend on the cause of your pain. HOME CARE  Only take medicine as told by your doctor.  Keep a healthy weight. Being overweight can make the knee hurt more.  Stretch before exercising or playing sports.  If there is constant knee pain, change the way you exercise. Ask your doctor for advice.  Make sure shoes fit well. Choose the right shoe for the sport or activity.  Protect your knees. Wear kneepads if needed.  Rest when you are tired. GET HELP RIGHT AWAY IF:   Your knee pain does not stop.  Your knee pain does not get better.  Your knee joint feels hot to the touch.  You have a fever. MAKE SURE YOU:   Understand these instructions.  Will watch this condition.  Will get help right away if you are not doing well or get worse. Document Released: 02/02/2009 Document Revised: 01/29/2012 Document Reviewed: 02/02/2009 [facility] Patient Information 2015 [place], [place]. This information is not intended to replace advice given to you by your health care provider. Make sure you discuss any questions you have with your health care provider.

## 2015-08-13 NOTE — ED Notes (Addendum)
Spoke with dr. Darl Householder regarding pt's hypertension of 198/119, leg numbness from left knee anterior to left mid ankle anterior. No new orders received. Pt continues to deny shob, nausea, dizziness, chest pain, headache, visual changes. Pt states takes losartan 100mg  daily. Pt continues to move all extremities without difficulty, complains of left knee pain when left knee moved. Antonieta Pert, rn notified of pt's hypertension and presenting symptoms. No order for ct received from dr. Darl Householder.

## 2015-08-13 NOTE — ED Notes (Addendum)
Pt states he was walking and his L knee "gave out". Pt has had several reconstructive surgeries to L knee.  [facility] administered Zofran 4 mg IM for nausea en route.

## 2015-08-13 NOTE — ED Provider Notes (Signed)
[facility] Emergency Department [place] Note  ____________________________________________  Time seen: Approximately 4098 PM  I have reviewed the triage vital signs and the nursing notes.   HISTORY  Chief Complaint Fall    HPI Caleb [name] is a 39 y.o. male with a history of chronic left knee pain and multiple surgeries in this knee who is presenting with a fall at about 7 PM earlier tonight. He says that he was getting out of the shower when his left leg "gave out on him." He has a history of this happening and has had multiple surgeries to this knee. He said that he originally injured the knee after falling into a hole while working. He said that he had multiple surgeries and needed [facility] were removed secondary to infection. He has intermittently needed to wear an immobilizer and use crutches. He says that he has "tingling" in his left lower extremity down to his foot which he says he has had happen in the past with injury. He takes 15 mg OxyContin at home for chronic pain. Says that he hit his head on a washing machine earlier tonight and had a headache earlier which is now resolved. Did not lose consciousness. Says that he is compliant with his losartan but his pressure has been elevated recently. Has appointment with his primary care doctor on the first of the month. Sees [facility].Denying any chest pain or shortness of breath.   Past Medical History  Diagnosis Date  . Borderline diabetes   . Hypertension   . Hypercholesteremia   . GERD (gastroesophageal reflux disease)   . Knee pain, bilateral   . PTSD (post-traumatic stress disorder)     There are no active problems to display for this patient.   Past Surgical History  Procedure Laterality Date  . Knee surgeries      Current Outpatient Rx  Name  Route  Sig  Dispense  Refill  . aspirin EC 81 MG tablet   Oral   Take 1 tablet by mouth daily.         Marland Kitchen  atorvastatin (LIPITOR) 80 MG tablet   Oral   Take 1 tablet by mouth daily.      3   . diclofenac sodium (VOLTAREN) 1 % GEL   Topical   Apply 2 g topically 4 (four) times daily.         Marland Kitchen losartan-hydrochlorothiazide (HYZAAR) 100-25 MG per tablet   Oral   Take 1 tablet by mouth daily.      11   . oxyCODONE-acetaminophen (PERCOCET/ROXICET) 5-325 MG per tablet   Oral   Take 1 tablet by mouth every 4 (four) hours as needed for severe pain.          . pantoprazole (PROTONIX) 40 MG tablet   Oral   Take 1 tablet by mouth daily.           Allergies Bee pollen; Contrast media; and Metformin and related  No family history on file.  Social History Social History  Substance Use Topics  . Smoking status: Current Every Day Smoker -- 1.00 packs/day    Types: Cigarettes  . Smokeless tobacco: Never Used  . Alcohol Use: 2.4 oz/week    4 Cans of beer per week    Review of Systems Constitutional: No fever/chills Eyes: No visual changes. ENT: No sore throat. Cardiovascular: Denies chest pain. Respiratory: Denies shortness of breath. Gastrointestinal: No abdominal pain.  No nausea, no vomiting.  No diarrhea.  No constipation. Genitourinary: Negative for dysuria. Musculoskeletal: Negative for back pain. Skin: Negative for rash. Neurological: Negative for headaches, focal weakness or numbness.  10-point ROS otherwise negative.  ____________________________________________   PHYSICAL EXAM:  VITAL SIGNS: ED Triage Vitals  Enc Vitals Group     BP 08/13/15 2018 198/119 mmHg     Pulse Rate 08/13/15 2018 72     Resp 08/13/15 2018 20     Temp 08/13/15 2018 98 F (36.7 C)     Temp Source 08/13/15 2203 Oral     SpO2 08/13/15 2010 95 %     Weight 08/13/15 2018 250 lb (113.399 kg)     Height 08/13/15 2018 6' (1.829 m)     Head Cir --      Peak Flow --      Pain Score 08/13/15 2019 8     Pain Loc --      Pain Edu? --      Excl. in [place]? --     Constitutional: Alert and  oriented. Well appearing and in no acute distress. Eyes: Conjunctivae are normal. PERRL. EOMI. Head: Atraumatic. Nose: No congestion/rhinnorhea. Mouth/Throat: Mucous membranes are moist.  Oropharynx non-erythematous. Neck: No stridor.   Cardiovascular: Normal rate, regular rhythm. Grossly normal heart sounds.  Good peripheral circulation. Respiratory: Normal respiratory effort.  No retractions. Lungs CTAB. Gastrointestinal: Soft and nontender. No distention. No abdominal bruits. No CVA tenderness. Musculoskeletal: Tenderness to the left medial knee. There is no swelling or effusion. There is no ligamentous laxity. The patient has bilateral and intact dorsalis pedis pulses and is neurovascularly intact distal to the left knee. Sensate to light touch bilaterally to the lower extremities. He is able to range his toes to the bilateral feet. Able to actively range the left knee with flexion and extension but with a great amount of pain to the left medial knee. Neurologic:  Normal speech and language. No gross focal neurologic deficits are appreciated. No gait instability. Skin:  Skin is warm, dry and intact. No rash noted. Psychiatric: Mood and affect are normal. Speech and behavior are normal.  ____________________________________________   LABS (all labs ordered are listed, but only abnormal results are displayed)  Labs Reviewed - No data to display ____________________________________________  EKG   ____________________________________________  RADIOLOGY  Chronic changes in the proximal tibia. No acute abnormality noted. ____________________________________________   PROCEDURES   ____________________________________________   INITIAL IMPRESSION / ASSESSMENT AND PLAN / ED COURSE  Pertinent labs & imaging results that were available during my care of the patient were reviewed by me and considered in my medical decision making (see chart for  details).  ----------------------------------------- 11:46 PM on 08/13/2015 -----------------------------------------  Patient's pain did not improve with Percocet in the waiting room. Pain no improving after Toradol and Dilaudid IM. We'll discharge to home with knee immobilizer and crutches. Has 15 mg oxycodone at home that he uses for his chronic pain control and will use them for this injury. Cannot rule out soft tissue or ligamentous injury from exam. We'll need to follow-up with orthopedist. ____________________________________________   FINAL CLINICAL IMPRESSION(S) / ED DIAGNOSES  Acute left knee pain secondary to trauma.    Orbie Pyo, MD 08/13/15 [id number]

## 2015-08-13 NOTE — ED Notes (Addendum)
Pt states was walking "when my left knee rolled up under me and gave out." pt states "i have no meniscus in that knee, it gives out on me all the time."  pt states "my foot feels numb", pt can sense pain to left foot, pt states "i hit the washer with my head". Pt denies loc. Pt denies neck pain.

## 2015-08-13 NOTE — ED Notes (Signed)
Pt moving all extremities without difficulty. Pt moving all toes, cap refill to left toes approx 4 seconds. No deformity noted.

## 2015-08-13 NOTE — ED Notes (Signed)
Spoke with dr. Clearnce Hasten regarding pt's continued hypertension and "numbness" per pt from left anterior knee to left anterior ankle. Pt to be placed in room 12. No new orders received from md at this time.

## 2015-10-02 ENCOUNTER — Emergency Department: Payer: Medicare HMO

## 2015-10-02 ENCOUNTER — Emergency Department
Admission: EM | Admit: 2015-10-02 | Discharge: 2015-10-02 | Disposition: A | Discharge status: Home / Self Care | Payer: Medicare HMO | Attending: Emergency Medicine | Admitting: Emergency Medicine

## 2015-10-02 ENCOUNTER — Encounter: Payer: Self-pay | Admitting: *Deleted

## 2015-10-02 DIAGNOSIS — E119 Type 2 diabetes mellitus without complications: Secondary | ICD-10-CM | POA: Diagnosis not present

## 2015-10-02 DIAGNOSIS — G43809 Other migraine, not intractable, without status migrainosus: Secondary | ICD-10-CM | POA: Insufficient documentation

## 2015-10-02 DIAGNOSIS — Z79899 Other long term (current) drug therapy: Secondary | ICD-10-CM | POA: Insufficient documentation

## 2015-10-02 DIAGNOSIS — F17210 Nicotine dependence, cigarettes, uncomplicated: Secondary | ICD-10-CM | POA: Insufficient documentation

## 2015-10-02 DIAGNOSIS — Z791 Long term (current) use of non-steroidal anti-inflammatories (NSAID): Secondary | ICD-10-CM | POA: Insufficient documentation

## 2015-10-02 DIAGNOSIS — G43109 Migraine with aura, not intractable, without status migrainosus: Secondary | ICD-10-CM

## 2015-10-02 DIAGNOSIS — R200 Anesthesia of skin: Secondary | ICD-10-CM | POA: Diagnosis present

## 2015-10-02 DIAGNOSIS — I10 Essential (primary) hypertension: Secondary | ICD-10-CM | POA: Diagnosis not present

## 2015-10-02 DIAGNOSIS — Z7982 Long term (current) use of aspirin: Secondary | ICD-10-CM | POA: Diagnosis not present

## 2015-10-02 LAB — CBC
HEMATOCRIT: 53.1 % — AB (ref 40.0–52.0)
HEMOGLOBIN: 18.6 g/dL — AB (ref 13.0–18.0)
MCH: 34.2 pg — AB (ref 26.0–34.0)
MCHC: 35.1 g/dL (ref 32.0–36.0)
MCV: 97.4 fL (ref 80.0–100.0)
Platelets: 240 10*3/uL (ref 150–440)
RBC: 5.45 MIL/uL (ref 4.40–5.90)
RDW: 13.7 % (ref 11.5–14.5)
WBC: 10.6 10*3/uL (ref 3.8–10.6)

## 2015-10-02 LAB — COMPREHENSIVE METABOLIC PANEL
ALK PHOS: 80 U/L (ref 38–126)
ALT: 58 U/L (ref 17–63)
ANION GAP: 11 (ref 5–15)
AST: 33 U/L (ref 15–41)
Albumin: 4.2 g/dL (ref 3.5–5.0)
BILIRUBIN TOTAL: 0.5 mg/dL (ref 0.3–1.2)
BUN: 8 mg/dL (ref 6–20)
CALCIUM: 9.3 mg/dL (ref 8.9–10.3)
CO2: 27 mmol/L (ref 22–32)
Chloride: 103 mmol/L (ref 101–111)
Creatinine, Ser: 1.02 mg/dL (ref 0.61–1.24)
GFR calc non Af Amer: >60 mL/min (ref >60)
Glucose, Bld: 181 mg/dL — ABNORMAL HIGH (ref 65–99)
Potassium: 3.4 mmol/L — ABNORMAL LOW (ref 3.5–5.1)
SODIUM: 141 mmol/L (ref 135–145)
TOTAL PROTEIN: 7.6 g/dL (ref 6.5–8.1)

## 2015-10-02 LAB — ETHANOL: Alcohol, Ethyl (B): <5 mg/dL (ref <5)

## 2015-10-02 LAB — PROTIME-INR
INR: 0.97
PROTHROMBIN TIME: 13.1 s (ref 11.4–15.0)

## 2015-10-02 LAB — TROPONIN I

## 2015-10-02 MED ORDER — HYDROMORPHONE HCL 1 MG/ML IJ SOLN
1.0000 mg | Freq: Once | INTRAMUSCULAR | Status: AC
  Administered 2015-10-02: 1 mg via INTRAVENOUS
  Filled 2015-10-02: qty 1

## 2015-10-02 MED ORDER — SODIUM CHLORIDE 0.9 % IV BOLUS (SEPSIS)
1000.0000 mL | Freq: Once | INTRAVENOUS | Status: AC
  Administered 2015-10-02: 1000 mL via INTRAVENOUS

## 2015-10-02 MED ORDER — DIPHENHYDRAMINE HCL 50 MG/ML IJ SOLN
50.0000 mg | Freq: Once | INTRAMUSCULAR | Status: AC
  Administered 2015-10-02: 50 mg via INTRAVENOUS
  Filled 2015-10-02: qty 1

## 2015-10-02 MED ORDER — HYDRALAZINE HCL 20 MG/ML IJ SOLN
10.0000 mg | Freq: Once | INTRAMUSCULAR | Status: AC
  Administered 2015-10-02: 10 mg via INTRAVENOUS
  Filled 2015-10-02: qty 1

## 2015-10-02 MED ORDER — KETOROLAC TROMETHAMINE 30 MG/ML IJ SOLN
30.0000 mg | Freq: Once | INTRAMUSCULAR | Status: AC
  Administered 2015-10-02: 30 mg via INTRAVENOUS
  Filled 2015-10-02: qty 1

## 2015-10-02 MED ORDER — METOCLOPRAMIDE HCL 5 MG/ML IJ SOLN
10.0000 mg | Freq: Once | INTRAMUSCULAR | Status: AC
  Administered 2015-10-02: 10 mg via INTRAVENOUS
  Filled 2015-10-02: qty 2

## 2015-10-02 NOTE — ED Notes (Signed)
Pt returned from CT °

## 2015-10-02 NOTE — ED Notes (Signed)
SOC at bedside. 

## 2015-10-02 NOTE — ED Notes (Signed)
Patient transported to CT 

## 2015-10-02 NOTE — ED Notes (Signed)
Pt  At home began having a bad headache, rt side weakness

## 2015-10-02 NOTE — Discharge Instructions (Signed)
You have been seen in the emergency department for a headache and right-sided weakness. Your workup is most consistent with a complicated migraine. Please follow-up with neurology as soon as possible by calling the number provided to arrange a follow-up appointment. Please return to the emergency department for any return of your headache, right arm or leg weakness/numbness, confusion, slurred speech, or any other symptom personally concerning to your self.    Migraine Headache A migraine headache is an intense, throbbing pain on one or both sides of your head. A migraine can last for 30 minutes to several hours. CAUSES  The exact cause of a migraine headache is not always known. However, a migraine may be caused when nerves in the brain become irritated and release chemicals that cause inflammation. This causes pain. Certain things may also trigger migraines, such as:  Alcohol.  Smoking.  Stress.  Menstruation.  Aged cheeses.  Foods or drinks that contain nitrates, glutamate, aspartame, or tyramine.  Lack of sleep.  Chocolate.  Caffeine.  Hunger.  Physical exertion.  Fatigue.  Medicines used to treat chest pain (nitroglycerine), birth control pills, estrogen, and some blood pressure medicines. SIGNS AND SYMPTOMS  Pain on one or both sides of your head.  Pulsating or throbbing pain.  Severe pain that prevents daily activities.  Pain that is aggravated by any physical activity.  Nausea, vomiting, or both.  Dizziness.  Pain with exposure to bright lights, loud noises, or activity.  General sensitivity to bright lights, loud noises, or smells. Before you get a migraine, you may get warning signs that a migraine is coming (aura). An aura may include:  Seeing flashing lights.  Seeing bright spots, halos, or zigzag lines.  Having tunnel vision or blurred vision.  Having feelings of numbness or tingling.  Having trouble talking.  Having muscle  weakness. DIAGNOSIS  A migraine headache is often diagnosed based on:  Symptoms.  Physical exam.  A CT scan or MRI of your head. These imaging tests cannot diagnose migraines, but they can help rule out other causes of headaches. TREATMENT Medicines may be given for pain and nausea. Medicines can also be given to help prevent recurrent migraines.  HOME CARE INSTRUCTIONS  Only take over-the-counter or prescription medicines for pain or discomfort as directed by your health care provider. The use of long-term narcotics is not recommended.  Lie down in a dark, quiet room when you have a migraine.  Keep a journal to find out what may trigger your migraine headaches. For example, write down:  What you eat and drink.  How much sleep you get.  Any change to your diet or medicines.  Limit alcohol consumption.  Quit smoking if you smoke.  Get 7-9 hours of sleep, or as recommended by your health care provider.  Limit stress.  Keep lights dim if bright lights bother you and make your migraines worse. SEEK IMMEDIATE MEDICAL CARE IF:   Your migraine becomes severe.  You have a fever.  You have a stiff neck.  You have vision loss.  You have muscular weakness or loss of muscle control.  You start losing your balance or have trouble walking.  You feel faint or pass out.  You have severe symptoms that are different from your first symptoms. MAKE SURE YOU:   Understand these instructions.  Will watch your condition.  Will get help right away if you are not doing well or get worse.   This information is not intended to replace advice given  to you by your health care provider. Make sure you discuss any questions you have with your health care provider.   Document Released: 11/06/2005 Document Revised: 11/27/2014 Document Reviewed: 07/14/2013 Elsevier Interactive Patient Education [employer].

## 2015-10-02 NOTE — ED Provider Notes (Signed)
[facility] Emergency Department Provider Note  Time seen: 7:46 PM  I have reviewed the triage vital signs and the nursing notes.   HISTORY  Chief Complaint Code Stroke    HPI Caleb [name] is a 39 y.o. male with a past medical history of diabetes, hypertension, hyperlipidemia, who presents the emergency department with right arm numbness and weakness. According to the patient he developed right arm numbness, weakness along with right leg numbness and weakness around 6 PM tonight. States he also has a severe headache. Patient states he had similar symptoms with a stroke in March of this year. Describes as numbness as severe, headache as severe, weakness as severe.     Past Medical History  Diagnosis Date  . Borderline diabetes   . Hypertension   . Hypercholesteremia   . GERD (gastroesophageal reflux disease)   . Knee pain, bilateral   . PTSD (post-traumatic stress disorder)     There are no active problems to display for this patient.   Past Surgical History  Procedure Laterality Date  . Knee surgeries      Current Outpatient Rx  Name  Route  Sig  Dispense  Refill  . aspirin EC 81 MG tablet   Oral   Take 1 tablet by mouth daily.         Marland Kitchen atorvastatin (LIPITOR) 80 MG tablet   Oral   Take 1 tablet by mouth daily.      3   . diclofenac sodium (VOLTAREN) 1 % GEL   Topical   Apply 2 g topically 4 (four) times daily.         Marland Kitchen losartan-hydrochlorothiazide (HYZAAR) 100-25 MG per tablet   Oral   Take 1 tablet by mouth daily.      11   . oxyCODONE-acetaminophen (PERCOCET/ROXICET) 5-325 MG per tablet   Oral   Take 1 tablet by mouth every 4 (four) hours as needed for severe pain.          . pantoprazole (PROTONIX) 40 MG tablet   Oral   Take 1 tablet by mouth daily.           Allergies Bee pollen; Contrast media; and Metformin and related  History reviewed. No pertinent family history.  Social History Social  History  Substance Use Topics  . Smoking status: Current Every Day Smoker -- 1.00 packs/day    Types: Cigarettes  . Smokeless tobacco: Never Used  . Alcohol Use: 2.4 oz/week    4 Cans of beer per week    Review of Systems Constitutional: Negative for fever. Eyes: States intermittent right eye blurriness. Cardiovascular: Negative for chest pain. Respiratory: Negative for shortness of breath. Gastrointestinal: Negative for abdominal pain Musculoskeletal: Negative for back pain. Neurological: Severe headache, right arm and leg weakness and numbness. 10-point ROS otherwise negative.  ____________________________________________   PHYSICAL EXAM:  VITAL SIGNS: ED Triage Vitals  Enc Vitals Group     BP 10/02/15 1931 202/112 mmHg     Pulse Rate 10/02/15 1931 92     Resp 10/02/15 1931 16     Temp --      Temp src --      SpO2 10/02/15 1931 95 %     Weight 10/02/15 1931 250 lb (113.399 kg)     Height 10/02/15 1931 6' (1.829 m)     Head Cir --      Peak Flow --      Pain Score 10/02/15 1932 10  Pain Loc --      Pain Edu? --      Excl. in [place]? --     Constitutional: Alert and oriented. Well appearing and in no distress. Eyes: Normal exam ENT   Head: Normocephalic and atraumatic.   Mouth/Throat: Mucous membranes are moist. Cardiovascular: Normal rate, regular rhythm. No murmur Respiratory: Normal respiratory effort without tachypnea nor retractions. Breath sounds are clear  Gastrointestinal: Soft and nontender. No distention.  Musculoskeletal: Nontender with normal range of motion in all extremities. Neurologic:  Normal speech and language. 4/5 motor in right upper extremity, 4/5 motor in left upper extremity. Pronator drift and right upper extremity, lower extremity right-sided drift as well. Cranial nerves intact. PERRL. Skin:  Skin is warm, dry and intact.  Psychiatric: Mood and affect are normal. Speech and behavior are normal.    ____________________________________________    EKG  EKG reviewed and interpreted by myself showed normal sinus rhythm at 83 bpm, narrow QRS, normal axis, normal intervals, nonspecific ST changes present. No ST elevations.  ____________________________________________    RADIOLOGY  CT head shows no acute abnormalities.  ____________________________________________    INITIAL IMPRESSION / ASSESSMENT AND PLAN / ED COURSE  Pertinent labs & imaging results that were available during my care of the patient were reviewed by me and considered in my medical decision making (see chart for details).  Patient presents with acute right upper and lower extremity weakness and numbness starting at 6 PM. History of similar symptoms in March. I reviewed the patient's chart. He was admitted to [facility] in March for similar symptoms, had 2 negative CT scans, refused MRI due to claustrophobia. Was ultimately diagnosed with TIA first complex migraine after symptoms improved. However given his acute onset of symptoms, with appreciable deficits on exam we will consult neurology, and a code stroke has been initiated.   Patient has been seen by teleneurology.  Neurologist does not believe the patient's symptoms are consistent with stroke. She states fine motor is intact including handwriting however the patient has significant proximal weakness which she states is inconsistent with CVA. She believes the patient's symptoms are more consistent with a complex migraine, or possibly hypertensive emergency/encephalitis. We will attempt to treat the patient's headache and monitor for symptomatic improvement. Teleneurology does not recommend TPA at this time. I agree with this assessment especially given his recent admission in March for likely complex migraine versus TIA.  Patient states right arm weakness has improved since migraine cocktail, patient now able to move arm around. Continues to have moderate  headache, with significant hypertension. I will dosed Dilaudid as well as hydralazine and monitor in the emergency department.  Patient care signed out to Dr. Burlene Arnt.    ____________________________________________   FINAL CLINICAL IMPRESSION(S) / ED DIAGNOSES  Right-sided weakness/numbness Complex migraine  Harvest Dark, MD 10/02/15 2150

## 2015-10-02 NOTE — ED Provider Notes (Signed)
-----------------------------------------   11:09 PM on 10/02/2015 -----------------------------------------  Patient signed out to me at this time, he is neurovascularly intact, he states his headache is nearly gone, he is eating and drinking and he is desirous of discharge. Per the prior physicians plan, patient's headache is improved he is suitable for discharge, he has been evaluated by neurology and outpatient follow-up has been set up. Patient did not have any evidence of a CVA, he had extensive outpatient workup for this in the past, patient himself declines any further workup at this time. His blood pressure somewhat elevated but again he is asymptomatic. No having any chest pain shortness of breath. He states his headache is nearly gone. Does not wish to be admitted to the hospital for this. He states he is oriented but admitted to [facility] for this. Patient refuses MRI. He is somewhat anxious, and when he is resting comfortably his blood pressures in the 130s, and then when I began to talk to him, it began to get back up. I think there is certainly some component of anxiety to this. He is not a candidate for TPA. He is moving all extremities with no difficulty in are intact. Patient does have home blood pressure medications and will continue to take him. He must follow-up for repeat blood pressure check in a few days and he understands this. Family is at bedside and they can take him home.  Schuyler Amor, MD 10/02/15 2312

## 2015-10-18 ENCOUNTER — Emergency Department
Admission: EM | Admit: 2015-10-18 | Discharge: 2015-10-18 | Disposition: A | Discharge status: Home / Self Care | Payer: Medicare HMO | Attending: Emergency Medicine | Admitting: Emergency Medicine

## 2015-10-18 ENCOUNTER — Encounter: Payer: Self-pay | Admitting: Emergency Medicine

## 2015-10-18 ENCOUNTER — Emergency Department: Payer: Medicare HMO

## 2015-10-18 DIAGNOSIS — Y998 Other external cause status: Secondary | ICD-10-CM | POA: Diagnosis not present

## 2015-10-18 DIAGNOSIS — Z791 Long term (current) use of non-steroidal anti-inflammatories (NSAID): Secondary | ICD-10-CM | POA: Insufficient documentation

## 2015-10-18 DIAGNOSIS — Z7952 Long term (current) use of systemic steroids: Secondary | ICD-10-CM | POA: Insufficient documentation

## 2015-10-18 DIAGNOSIS — Y9289 Other specified places as the place of occurrence of the external cause: Secondary | ICD-10-CM | POA: Insufficient documentation

## 2015-10-18 DIAGNOSIS — F17210 Nicotine dependence, cigarettes, uncomplicated: Secondary | ICD-10-CM | POA: Insufficient documentation

## 2015-10-18 DIAGNOSIS — I10 Essential (primary) hypertension: Secondary | ICD-10-CM | POA: Insufficient documentation

## 2015-10-18 DIAGNOSIS — Z7982 Long term (current) use of aspirin: Secondary | ICD-10-CM | POA: Diagnosis not present

## 2015-10-18 DIAGNOSIS — W01198A Fall on same level from slipping, tripping and stumbling with subsequent striking against other object, initial encounter: Secondary | ICD-10-CM | POA: Diagnosis not present

## 2015-10-18 DIAGNOSIS — S299XXA Unspecified injury of thorax, initial encounter: Secondary | ICD-10-CM | POA: Insufficient documentation

## 2015-10-18 DIAGNOSIS — Z79899 Other long term (current) drug therapy: Secondary | ICD-10-CM | POA: Diagnosis not present

## 2015-10-18 DIAGNOSIS — M5410 Radiculopathy, site unspecified: Secondary | ICD-10-CM | POA: Diagnosis not present

## 2015-10-18 DIAGNOSIS — Y9389 Activity, other specified: Secondary | ICD-10-CM | POA: Diagnosis not present

## 2015-10-18 HISTORY — DX: Personal history of other diseases of the musculoskeletal system and connective tissue: Z87.39

## 2015-10-18 MED ORDER — HYDROMORPHONE HCL 1 MG/ML IJ SOLN
1.0000 mg | Freq: Once | INTRAMUSCULAR | Status: AC
  Administered 2015-10-18: 1 mg via INTRAMUSCULAR

## 2015-10-18 MED ORDER — METHOCARBAMOL 750 MG PO TABS: 750.0000 mg | ORAL_TABLET | Freq: Three times a day (TID) | ORAL | Status: DC | PRN

## 2015-10-18 MED ORDER — IBUPROFEN 800 MG PO TABS
800.0000 mg | ORAL_TABLET | Freq: Once | ORAL | Status: AC
  Administered 2015-10-18: 800 mg via ORAL
  Filled 2015-10-18: qty 1

## 2015-10-18 MED ORDER — PREDNISONE 10 MG (21) PO TBPK: ORAL_TABLET | ORAL | Status: DC

## 2015-10-18 MED ORDER — HYDROMORPHONE HCL 1 MG/ML IJ SOLN
1.0000 mg | Freq: Once | INTRAMUSCULAR | Status: DC
  Filled 2015-10-18: qty 1

## 2015-10-18 MED ORDER — IBUPROFEN 800 MG PO TABS: 800.0000 mg | ORAL_TABLET | Freq: Three times a day (TID) | ORAL | Status: DC | PRN

## 2015-10-18 NOTE — Discharge Instructions (Signed)
Radicular Pain °Radicular pain in either the arm or leg is usually from a bulging or herniated disk in the spine. A piece of the herniated disk may press against the nerves as the nerves exit the spine. This causes pain which is felt at the tips of the nerves down the arm or leg. Other causes of radicular pain may include: °· Fractures. °· Heart disease. °· Cancer. °· An abnormal and usually degenerative state of the nervous system or nerves (neuropathy). °Diagnosis may require CT or MRI scanning to determine the primary cause.  °Nerves that start at the neck (nerve roots) may cause radicular pain in the outer shoulder and arm. It can spread down to the thumb and fingers. The symptoms vary depending on which nerve root has been affected. In most cases radicular pain improves with conservative treatment. Neck problems may require physical therapy, a neck collar, or cervical traction. Treatment may take many weeks, and surgery may be considered if the symptoms do not improve.  °Conservative treatment is also recommended for sciatica. Sciatica causes pain to radiate from the lower back or buttock area down the leg into the foot. Often there is a history of back problems. Most patients with sciatica are better after 2 to 4 weeks of rest and other supportive care. Short term bed rest can reduce the disk pressure considerably. Sitting, however, is not a good position since this increases the pressure on the disk. You should avoid bending, lifting, and all other activities which make the problem worse. Traction can be used in severe cases. Surgery is usually reserved for patients who do not improve within the first months of treatment. °Only take over-the-counter or prescription medicines for pain, discomfort, or fever as directed by your caregiver. Narcotics and muscle relaxants may help by relieving more severe pain and spasm and by providing mild sedation. Cold or massage can give significant relief. Spinal manipulation  is not recommended. It can increase the degree of disc protrusion. Epidural steroid injections are often effective treatment for radicular pain. These injections deliver medicine to the spinal nerve in the space between the protective covering of the spinal cord and back bones (vertebrae). Your caregiver can give you more information about steroid injections. These injections are most effective when given within two weeks of the onset of pain.  °You should see your caregiver for follow up care as recommended. A program for neck and back injury rehabilitation with stretching and strengthening exercises is an important part of management.  °SEEK IMMEDIATE MEDICAL CARE IF: °· You develop increased pain, weakness, or numbness in your arm or leg. °· You develop difficulty with bladder or bowel control. °· You develop abdominal pain. °  °This information is not intended to replace advice given to you by your health care provider. Make sure you discuss any questions you have with your health care provider. °  °Document Released: 12/14/2004 Document Revised: 11/27/2014 Document Reviewed: 06/02/2015 °Elsevier Interactive Patient Education ©2016 Elsevier Inc. ° °

## 2015-10-18 NOTE — ED Notes (Addendum)
Patient ambulatory to triage with steady gait, without difficulty or distress noted; pt reports fell Saturday pm after stepping on dog's ball and injuring upper back; st since Sunday having pain and numbness to left arm with persistent upper back pain; st hx DDD with L5 fx; c/o spinal tenderness with palpation--no cervical tenderness

## 2015-10-18 NOTE — ED Notes (Signed)
Spoke with Dr Mariea Clonts regarding pt's presenting cc and hx; orders obtained for xray only and st OK for flex to see at this time

## 2015-10-18 NOTE — ED Provider Notes (Signed)
[facility] Emergency Department Provider Note ____________________________________________  Time seen: Approximately 9:17 PM  I have reviewed the triage vital signs and the nursing notes.   HISTORY  Chief Complaint Back Pain   HPI Caleb [name] is a 39 y.o. male who presents to the emergency department for evaluation of pain between his shoulder blades that radiates down his left arm. Pain started after he stepped on a dog ball and it rolled out from underneath his foot causing him to fall flat on his back. The incident occurred on Saturday. He is under pain management for chronic knee pain and has been taking his Percocet as prescribed, but this has not helped his back. The numbness and occasional tingling in the left arm started a couple of days ago.  Past Medical History  Diagnosis Date  . Borderline diabetes   . Hypertension   . Hypercholesteremia   . GERD (gastroesophageal reflux disease)   . Knee pain, bilateral   . PTSD (post-traumatic stress disorder)   . History of degenerative disc disease     There are no active problems to display for this patient.   Past Surgical History  Procedure Laterality Date  . Knee surgeries      Current Outpatient Rx  Name  Route  Sig  Dispense  Refill  . aspirin EC 81 MG tablet   Oral   Take 1 tablet by mouth daily.         Marland Kitchen atorvastatin (LIPITOR) 80 MG tablet   Oral   Take 1 tablet by mouth at bedtime.       3   . diazepam (VALIUM) 5 MG tablet   Oral   Take 5 mg by mouth 2 (two) times daily.         . diclofenac sodium (VOLTAREN) 1 % GEL   Topical   Apply 2 g topically 4 (four) times daily.         . diphenhydrAMINE (BENADRYL) 25 mg capsule   Oral   Take 50 mg by mouth every 6 (six) hours as needed for itching or allergies.         Marland Kitchen gabapentin (NEURONTIN) 100 MG capsule   Oral   Take 300 mg by mouth at bedtime.      11   . ibuprofen (ADVIL,MOTRIN) 800 MG tablet   Oral  Take 1 tablet (800 mg total) by mouth every 8 (eight) hours as needed.   30 tablet   0   . losartan-hydrochlorothiazide (HYZAAR) 100-25 MG per tablet   Oral   Take 1 tablet by mouth daily.      11   . methocarbamol (ROBAXIN-750) 750 MG tablet   Oral   Take 1 tablet (750 mg total) by mouth every 8 (eight) hours as needed for muscle spasms.   30 tablet   0   . oxyCODONE (ROXICODONE) 15 MG immediate release tablet   Oral   Take 15 mg by mouth every 6 (six) hours as needed. For pain.      0   . pantoprazole (PROTONIX) 40 MG tablet   Oral   Take 1 tablet by mouth daily.         . predniSONE (STERAPRED UNI-PAK 21 TAB) 10 MG (21) TBPK tablet      Take 6 tablets on day 1 Take 5 tablets on day 2 Take 4 tablets on day 3 Take 3 tablets on day 4 Take 2 tablets on day 5 Take 1 tablet  on day 6   21 tablet   0     Allergies Bee pollen; Contrast media; and Metformin and related  No family history on file.  Social History Social History  Substance Use Topics  . Smoking status: Current Every Day Smoker -- 1.00 packs/day    Types: Cigarettes  . Smokeless tobacco: Never Used  . Alcohol Use: 2.4 oz/week    4 Cans of beer per week    Review of Systems Constitutional: No recent illness. Eyes: No visual changes. ENT: No sore throat. Cardiovascular: Denies chest pain or palpitations. Respiratory: Denies shortness of breath. Gastrointestinal: No abdominal pain.  Genitourinary: Negative for dysuria. Musculoskeletal: Pain  between shoulder blades and left arm. Skin: Negative for rash. Neurological: Negative for headaches, focal weakness or numbness. 10-point ROS otherwise negative.  ____________________________________________   PHYSICAL EXAM:  VITAL SIGNS: ED Triage Vitals  Enc Vitals Group     BP 10/18/15 1952 207/98 mmHg     Pulse Rate 10/18/15 1952 100     Resp 10/18/15 1952 22     Temp 10/18/15 1952 98 F (36.7 C)     Temp Source 10/18/15 1952 Oral     SpO2  10/18/15 1952 97 %     Weight 10/18/15 1952 250 lb (113.399 kg)     Height 10/18/15 1952 6' (1.829 m)     Head Cir --      Peak Flow --      Pain Score 10/18/15 1951 10     Pain Loc --      Pain Edu? --      Excl. in [place]? --     Constitutional: Alert and oriented. Well appearing and in no acute distress. Eyes: Conjunctivae are normal. EOMI. Head: Atraumatic. Nose: No congestion/rhinnorhea. Neck: No stridor.  Respiratory: Normal respiratory effort.   Musculoskeletal: Tenderness noted over the posterior thorax with palpable swelling. Strength is 5+ against resistance. Grip strength is equal. Full range of motion of all extremities. Nexus criteria is negative. Neurologic:  Normal speech and language. No gross focal neurologic deficits are appreciated. Speech is normal. No gait instability. Skin:  Skin is warm, dry and intact. Atraumatic. Psychiatric: Mood and affect are normal. Speech and behavior are normal.  ____________________________________________   LABS (all labs ordered are listed, but only abnormal results are displayed)  Labs Reviewed - No data to display ____________________________________________  RADIOLOGY  Thoracic spine films negative for acute bony abnormality. ____________________________________________   PROCEDURES  Procedure(s) performed: None   ____________________________________________   INITIAL IMPRESSION / ASSESSMENT AND PLAN / ED COURSE  Pertinent labs & imaging results that were available during my care of the patient were reviewed by me and considered in my medical decision making (see chart for details).  IM Dilaudid 1 mg was given while in the emergency department. He will have a prescription for Robaxin, prednisone, and ibuprofen. He was advised to continue taking his Percocet as prescribed by pain management. He was advised to follow-up with orthopedics for symptoms that are not improving over the next few days. He was advised to return  to the emergency department for symptoms that change or worsen if he is unable schedule an appointment. ____________________________________________   FINAL CLINICAL IMPRESSION(S) / ED DIAGNOSES  Final diagnoses:  Thoracic injury, initial encounter  Radiculopathy of arm      Victorino Dike, FNP 10/18/15 [place], FNP 10/18/15 2324  Hinda Kehr, MD 10/19/15 [id number]

## 2015-10-18 NOTE — ED Notes (Signed)
AAOx3.  Skin warm and dry.  States pain is improving.  D/C home

## 2015-11-11 ENCOUNTER — Emergency Department: Payer: Medicare HMO

## 2015-11-11 ENCOUNTER — Emergency Department
Admission: EM | Admit: 2015-11-11 | Discharge: 2015-11-11 | Disposition: A | Discharge status: Home / Self Care | Payer: Medicare HMO | Attending: Emergency Medicine | Admitting: Emergency Medicine

## 2015-11-11 DIAGNOSIS — R197 Diarrhea, unspecified: Secondary | ICD-10-CM

## 2015-11-11 DIAGNOSIS — R8299 Other abnormal findings in urine: Secondary | ICD-10-CM | POA: Diagnosis not present

## 2015-11-11 DIAGNOSIS — R05 Cough: Secondary | ICD-10-CM | POA: Insufficient documentation

## 2015-11-11 DIAGNOSIS — R112 Nausea with vomiting, unspecified: Secondary | ICD-10-CM

## 2015-11-11 DIAGNOSIS — F10230 Alcohol dependence with withdrawal, uncomplicated: Secondary | ICD-10-CM | POA: Diagnosis not present

## 2015-11-11 DIAGNOSIS — F17210 Nicotine dependence, cigarettes, uncomplicated: Secondary | ICD-10-CM | POA: Diagnosis not present

## 2015-11-11 DIAGNOSIS — Z79899 Other long term (current) drug therapy: Secondary | ICD-10-CM | POA: Insufficient documentation

## 2015-11-11 DIAGNOSIS — I10 Essential (primary) hypertension: Secondary | ICD-10-CM | POA: Insufficient documentation

## 2015-11-11 DIAGNOSIS — Z7982 Long term (current) use of aspirin: Secondary | ICD-10-CM | POA: Diagnosis not present

## 2015-11-11 DIAGNOSIS — R109 Unspecified abdominal pain: Secondary | ICD-10-CM | POA: Diagnosis present

## 2015-11-11 DIAGNOSIS — F10930 Alcohol use, unspecified with withdrawal, uncomplicated: Secondary | ICD-10-CM

## 2015-11-11 LAB — TROPONIN I

## 2015-11-11 LAB — CBC
HCT: 53.2 % — ABNORMAL HIGH (ref 40.0–52.0)
Hemoglobin: 18.4 g/dL — ABNORMAL HIGH (ref 13.0–18.0)
MCH: 33.4 pg (ref 26.0–34.0)
MCHC: 34.7 g/dL (ref 32.0–36.0)
MCV: 96.4 fL (ref 80.0–100.0)
Platelets: 233 10*3/uL (ref 150–440)
RBC: 5.52 MIL/uL (ref 4.40–5.90)
RDW: 13.7 % (ref 11.5–14.5)
WBC: 10.8 10*3/uL — ABNORMAL HIGH (ref 3.8–10.6)

## 2015-11-11 LAB — COMPREHENSIVE METABOLIC PANEL
ALBUMIN: 4.5 g/dL (ref 3.5–5.0)
ALK PHOS: 94 U/L (ref 38–126)
ALT: 99 U/L — AB (ref 17–63)
AST: 52 U/L — AB (ref 15–41)
Anion gap: 8 (ref 5–15)
BUN: 9 mg/dL (ref 6–20)
CALCIUM: 9.1 mg/dL (ref 8.9–10.3)
CO2: 25 mmol/L (ref 22–32)
CREATININE: 0.9 mg/dL (ref 0.61–1.24)
Chloride: 101 mmol/L (ref 101–111)
GFR calc Af Amer: >60 mL/min (ref >60)
GFR calc non Af Amer: >60 mL/min (ref >60)
GLUCOSE: 103 mg/dL — AB (ref 65–99)
Potassium: 3.3 mmol/L — ABNORMAL LOW (ref 3.5–5.1)
SODIUM: 134 mmol/L — AB (ref 135–145)
Total Bilirubin: 0.7 mg/dL (ref 0.3–1.2)
Total Protein: 7.8 g/dL (ref 6.5–8.1)

## 2015-11-11 LAB — URINALYSIS COMPLETE WITH MICROSCOPIC (ARMC ONLY)
BACTERIA UA: NONE SEEN
Bilirubin Urine: NEGATIVE
GLUCOSE, UA: 150 mg/dL — AB
Hgb urine dipstick: NEGATIVE
KETONES UR: NEGATIVE mg/dL
Leukocytes, UA: NEGATIVE
NITRITE: NEGATIVE
PROTEIN: NEGATIVE mg/dL
SPECIFIC GRAVITY, URINE: 1.024 (ref 1.005–1.030)
pH: 5 (ref 5.0–8.0)

## 2015-11-11 LAB — LIPASE, BLOOD: Lipase: 25 U/L (ref 11–51)

## 2015-11-11 MED ORDER — OXYCODONE-ACETAMINOPHEN 5-325 MG PO TABS
1.0000 | ORAL_TABLET | Freq: Once | ORAL | Status: AC
  Administered 2015-11-11: 1 via ORAL

## 2015-11-11 MED ORDER — SODIUM CHLORIDE 0.9 % IV BOLUS (SEPSIS)
1000.0000 mL | Freq: Once | INTRAVENOUS | Status: AC
  Administered 2015-11-11: 1000 mL via INTRAVENOUS

## 2015-11-11 MED ORDER — ONDANSETRON HCL 4 MG PO TABS: 4.0000 mg | ORAL_TABLET | Freq: Every day | ORAL | Status: DC | PRN

## 2015-11-11 MED ORDER — ONDANSETRON 4 MG PO TBDP
4.0000 mg | ORAL_TABLET | Freq: Once | ORAL | Status: AC
  Administered 2015-11-11: 4 mg via ORAL

## 2015-11-11 MED ORDER — OXYCODONE-ACETAMINOPHEN 5-325 MG PO TABS
ORAL_TABLET | ORAL | Status: AC
  Administered 2015-11-11: 1 via ORAL
  Filled 2015-11-11: qty 1

## 2015-11-11 MED ORDER — KETOROLAC TROMETHAMINE 30 MG/ML IJ SOLN
30.0000 mg | Freq: Once | INTRAMUSCULAR | Status: AC
  Administered 2015-11-11: 30 mg via INTRAVENOUS
  Filled 2015-11-11: qty 1

## 2015-11-11 MED ORDER — ONDANSETRON 4 MG PO TBDP
ORAL_TABLET | ORAL | Status: AC
  Administered 2015-11-11: 4 mg via ORAL
  Filled 2015-11-11: qty 1

## 2015-11-11 NOTE — Discharge Instructions (Signed)
Alcohol Withdrawal When a person who drinks a lot of alcohol stops drinking, he or she may go through alcohol withdrawal. Alcohol withdrawal causes problems. It can make you feel:  Tired (fatigued).  Sad (depressed).  Fearful (anxious).  Grouchy (irritable).  Not hungry.  Sick to your stomach (nauseous).  Shaky. It can also make you have:  Nightmares.  Trouble sleeping.  Trouble thinking clearly.  Mood swings.  Clammy skin.  Very bad sweating.  A very fast heartbeat.  Shaking that you cannot control (tremor).  Having a fever.  A fit of movements that you cannot control (seizure).  Confusion.  Throwing up (vomiting).  Feeling or seeing things that are not there (hallucinations). HOME CARE  Take medicines and vitamins only as told by your doctor.  Do not drink alcohol.  Have someone around in case you need help.  Drink enough fluid to keep your pee (urine) clear or pale yellow.  Think about joining a group to help you stop drinking. GET HELP IF:  Your problems get worse.  Your problems do not go away.  You cannot eat or drink without throwing up.  You are having a hard time not drinking alcohol.  You cannot stop drinking alcohol. GET HELP RIGHT AWAY IF:  You feel your heart beating differently than usual.  Your chest hurts.  You have trouble breathing.  You have very bad problems, like:  A fever.  A fit of movements that you cannot control.  Being very confused.  Feeling or seeing things that are not there.   This information is not intended to replace advice given to you by your health care provider. Make sure you discuss any questions you have with your health care provider.   Document Released: 04/24/2008 Document Revised: 11/27/2014 Document Reviewed: 08/25/2014 Elsevier Interactive Patient Education 2016 [place].  Diarrhea Diarrhea is frequent loose and watery bowel movements. It can cause you to feel weak and  dehydrated. Dehydration can cause you to become tired and thirsty, have a dry mouth, and have decreased urination that often is dark yellow. Diarrhea is a sign of another problem, most often an infection that will not last long. In most cases, diarrhea typically lasts 2-3 days. However, it can last longer if it is a sign of something more serious. It is important to treat your diarrhea as directed by your caregiver to lessen or prevent future episodes of diarrhea. CAUSES  Some common causes include:  Gastrointestinal infections caused by viruses, bacteria, or parasites.  Food poisoning or food allergies.  Certain medicines, such as antibiotics, chemotherapy, and laxatives.  Artificial sweeteners and fructose.  Digestive disorders. HOME CARE INSTRUCTIONS  Ensure adequate fluid intake (hydration): Have 1 cup (8 oz) of fluid for each diarrhea episode. Avoid fluids that contain simple sugars or sports drinks, fruit juices, whole milk products, and sodas. Your urine should be clear or pale yellow if you are drinking enough fluids. Hydrate with an oral rehydration solution that you can purchase at pharmacies, retail stores, and online. You can prepare an oral rehydration solution at home by mixing the following ingredients together:   - tsp table salt.   tsp baking soda.   tsp salt substitute containing potassium chloride.  1  tablespoons sugar.  1 L (34 oz) of water.  Certain foods and beverages may increase the speed at which food moves through the gastrointestinal (GI) tract. These foods and beverages should be avoided and include:  Caffeinated and alcoholic beverages.  High-fiber foods,  such as raw fruits and vegetables, nuts, seeds, and whole grain breads and cereals.  Foods and beverages sweetened with sugar alcohols, such as xylitol, sorbitol, and mannitol.  Some foods may be well tolerated and may help thicken stool including:  Starchy foods, such as rice, toast, pasta,  low-sugar cereal, oatmeal, grits, baked potatoes, crackers, and bagels.  Bananas.  Applesauce.  Add probiotic-rich foods to help increase healthy bacteria in the GI tract, such as yogurt and fermented milk products.  Wash your hands well after each diarrhea episode.  Only take over-the-counter or prescription medicines as directed by your caregiver.  Take a warm bath to relieve any burning or pain from frequent diarrhea episodes. SEEK IMMEDIATE MEDICAL CARE IF:   You are unable to keep fluids down.  You have persistent vomiting.  You have blood in your stool, or your stools are black and tarry.  You do not urinate in 6-8 hours, or there is only a small amount of very dark urine.  You have abdominal pain that increases or localizes.  You have weakness, dizziness, confusion, or light-headedness.  You have a severe headache.  Your diarrhea gets worse or does not get better.  You have a fever or persistent symptoms for more than 2-3 days.  You have a fever and your symptoms suddenly get worse. MAKE SURE YOU:   Understand these instructions.  Will watch your condition.  Will get help right away if you are not doing well or get worse.   This information is not intended to replace advice given to you by your health care provider. Make sure you discuss any questions you have with your health care provider.   Document Released: 10/27/2002 Document Revised: 11/27/2014 Document Reviewed: 07/14/2012 Elsevier Interactive Patient Education 2016 Elsevier Inc.  Nausea and Vomiting Nausea means you feel sick to your stomach. Throwing up (vomiting) is a reflex where stomach contents come out of your mouth. HOME CARE   Take medicine as told by your doctor.  Do not force yourself to eat. However, you do need to drink fluids.  If you feel like eating, eat a normal diet as told by your doctor.  Eat rice, wheat, potatoes, bread, lean meats, yogurt, fruits, and vegetables.  Avoid  high-fat foods.  Drink enough fluids to keep your pee (urine) clear or pale yellow.  Ask your doctor how to replace body fluid losses (rehydrate). Signs of body fluid loss (dehydration) include:  Feeling very thirsty.  Dry lips and mouth.  Feeling dizzy.  Dark pee.  Peeing less than normal.  Feeling confused.  Fast breathing or heart rate. GET HELP RIGHT AWAY IF:   You have blood in your throw up.  You have black or bloody poop (stool).  You have a bad headache or stiff neck.  You feel confused.  You have bad belly (abdominal) pain.  You have chest pain or trouble breathing.  You do not pee at least once every 8 hours.  You have cold, clammy skin.  You keep throwing up after 24 to 48 hours.  You have a fever. MAKE SURE YOU:   Understand these instructions.  Will watch your condition.  Will get help right away if you are not doing well or get worse.   This information is not intended to replace advice given to you by your health care provider. Make sure you discuss any questions you have with your health care provider.   Document Released: 04/24/2008 Document Revised: 01/29/2012 Document Reviewed: 04/07/2011  Elsevier Interactive Patient Education ©2016 Elsevier Inc. ° °

## 2015-11-11 NOTE — ED Provider Notes (Signed)
[facility] Emergency Department Provider Note  ____________________________________________  Time seen: Approximately 4 PM  I have reviewed the triage vital signs and the nursing notes.   HISTORY  Chief Complaint Flank Pain    HPI Caleb [name] is a 39 y.o. male with a history of hypertension and PTSD who is presenting today with bilateral CVA pain. He says that he has been vomiting over the past 3 days as well as having diarrhea several times a day. He denies any runny nose but does say that he has a cough which he describes as a "smoker's cough." He says that this cough is unchanged and he denies any fever associated with it. He denies any known sick contacts. He does say that he quit alcohol cold [place] 4 days ago. He said that he was drinking up to 8 drinks a night and said it was making him feel ill so he stopped. He has not had any seizure he says that he has felt tremulous at times but does not feel this way right now. Denies any recent hospitalizations or antibiotics. Denies any blood in his vomitus or diarrhea.He says that the pain is aching and nonradiating. He has not had anything for pain control. Says that he also has body aches in his bilateral lower extremities. Patient also reporting shortness of breath with exertion but no chest pain.   Past Medical History  Diagnosis Date  . Borderline diabetes   . Hypertension   . Hypercholesteremia   . GERD (gastroesophageal reflux disease)   . Knee pain, bilateral   . PTSD (post-traumatic stress disorder)   . History of degenerative disc disease     There are no active problems to display for this patient.   Past Surgical History  Procedure Laterality Date  . Knee surgeries      Current Outpatient Rx  Name  Route  Sig  Dispense  Refill  . aspirin EC 81 MG tablet   Oral   Take 1 tablet by mouth daily.         Marland Kitchen atorvastatin (LIPITOR) 80 MG tablet   Oral   Take 1 tablet by mouth at  bedtime.       3   . diazepam (VALIUM) 5 MG tablet   Oral   Take 5 mg by mouth 2 (two) times daily.         . diclofenac sodium (VOLTAREN) 1 % GEL   Topical   Apply 2 g topically 4 (four) times daily.         . diphenhydrAMINE (BENADRYL) 25 mg capsule   Oral   Take 50 mg by mouth every 6 (six) hours as needed for itching or allergies.         Marland Kitchen gabapentin (NEURONTIN) 100 MG capsule   Oral   Take 300 mg by mouth at bedtime.      11   . ibuprofen (ADVIL,MOTRIN) 800 MG tablet   Oral   Take 1 tablet (800 mg total) by mouth every 8 (eight) hours as needed.   30 tablet   0   . losartan-hydrochlorothiazide (HYZAAR) 100-25 MG per tablet   Oral   Take 1 tablet by mouth daily.      11   . methocarbamol (ROBAXIN-750) 750 MG tablet   Oral   Take 1 tablet (750 mg total) by mouth every 8 (eight) hours as needed for muscle spasms.   30 tablet   0   . oxyCODONE (ROXICODONE)  15 MG immediate release tablet   Oral   Take 15 mg by mouth every 6 (six) hours as needed. For pain.      0   . pantoprazole (PROTONIX) 40 MG tablet   Oral   Take 1 tablet by mouth daily.         . predniSONE (STERAPRED UNI-PAK 21 TAB) 10 MG (21) TBPK tablet      Take 6 tablets on day 1 Take 5 tablets on day 2 Take 4 tablets on day 3 Take 3 tablets on day 4 Take 2 tablets on day 5 Take 1 tablet on day 6   21 tablet   0     Allergies Bee pollen; Contrast media; and Metformin and related  No family history on file.  Social History Social History  Substance Use Topics  . Smoking status: Current Every Day Smoker -- 1.00 packs/day    Types: Cigarettes  . Smokeless tobacco: Never Used  . Alcohol Use: 2.4 oz/week    4 Cans of beer per week    Review of Systems Constitutional: No fever/chills Eyes: No visual changes. ENT: No sore throat. Cardiovascular: Denies chest pain. Respiratory: Denies shortness of breath. Gastrointestinal: No abdominal pain.   No  constipation. Genitourinary: No pain with urination but does note dark and foul-smelling urine.  Musculoskeletal: As above. Skin: Negative for rash. Neurological: Negative for headaches, focal weakness or numbness.  10-point ROS otherwise negative.  ____________________________________________   PHYSICAL EXAM:  VITAL SIGNS: ED Triage Vitals  Enc Vitals Group     BP 11/11/15 1414 171/130 mmHg     Pulse Rate 11/11/15 1414 87     Resp 11/11/15 1414 18     Temp 11/11/15 1414 97.6 F (36.4 C)     Temp Source 11/11/15 1414 Oral     SpO2 11/11/15 1414 96 %     Weight 11/11/15 1414 258 lb (117.028 kg)     Height 11/11/15 1414 6' (1.829 m)     Head Cir --      Peak Flow --      Pain Score 11/11/15 1415 6     Pain Loc --      Pain Edu? --      Excl. in [place]? --     Constitutional: Alert and oriented. Well appearing and in no acute distress. Eyes: Conjunctivae are normal. PERRL. EOMI. Head: Atraumatic. Nose: No congestion/rhinnorhea. Mouth/Throat: Mucous membranes are moist.  Oropharynx non-erythematous. Neck: No stridor.   Cardiovascular: Normal rate, regular rhythm. Grossly normal heart sounds.  Good peripheral circulation. Respiratory: Normal respiratory effort.  No retractions. Lungs CTAB. Gastrointestinal: Soft and nontender. No distention. No abdominal bruits. No CVA tenderness. Musculoskeletal: No lower extremity tenderness nor edema.  No joint effusions. Neurologic:  Normal speech and language. No gross focal neurologic deficits are appreciated. No gait instability. No tremulousness. Skin:  Skin is warm, dry and intact. No rash noted. Psychiatric: Mood and affect are normal. Speech and behavior are normal.  ____________________________________________   LABS (all labs ordered are listed, but only abnormal results are displayed)  Labs Reviewed  COMPREHENSIVE METABOLIC PANEL - Abnormal; Notable for the following:    Sodium 134 (*)    Potassium 3.3 (*)    Glucose, Bld  103 (*)    AST 52 (*)    ALT 99 (*)    All other components within normal limits  CBC - Abnormal; Notable for the following:    WBC 10.8 (*)  Hemoglobin 18.4 (*)    HCT 53.2 (*)    All other components within normal limits  URINALYSIS COMPLETEWITH MICROSCOPIC ([facility] ONLY) - Abnormal; Notable for the following:    Color, Urine YELLOW (*)    APPearance CLEAR (*)    Glucose, UA 150 (*)    Squamous Epithelial / LPF 0-5 (*)    All other components within normal limits  LIPASE, BLOOD  TROPONIN I   ____________________________________________  EKG  ED ECG REPORT I, Doran Stabler, the attending physician, personally viewed and interpreted this ECG.   Date: 11/11/2015  EKG Time: 1422  Rate: 87  Rhythm: normal sinus rhythm  Axis: Normal axis  Intervals:none  ST&T Change: Biphasic T waves in the lateral leads V4 through V6 with previous repolarization abnormality with similar appearance from 10/02/2015. There is no ST elevation or depression. ____________________________________________  RADIOLOGY  No edema or consolidation. ____________________________________________   PROCEDURES   ____________________________________________   INITIAL IMPRESSION / ASSESSMENT AND PLAN / ED COURSE  Pertinent labs & imaging results that were available during my care of the patient were reviewed by me and considered in my medical decision making (see chart for details).  Patient with recent stoppage of chronic alcohol consumption. Possibly presentation related to this with mild dehydration and body aches associated with it versus viral illness. Mildly elevated transaminases which have been seen in the past.PERC negative.    ----------------------------------------- 5:55 PM on 11/11/2015 -----------------------------------------  Patient reevaluated and pain is not improved but patient able to tolerate by mouth. We'll discharge home with Zofran for nausea. Feel that the patient's  symptoms are likely related to a virus or alcohol withdrawal. He said that he had been drinking for several years. He does not have any tremulousness in his 40s out from stopping drinking. I feel that he has probably passed the point of these major delirium tremens type competitions. Counseled patient as well as his family is to the workup results as well as the plan. The patient will follow-up with his primary care doctor. ____________________________________________   FINAL CLINICAL IMPRESSION(S) / ED DIAGNOSES  Nausea vomiting and diarrhea. Alcohol withdrawal.    Orbie Pyo, MD 11/11/15 [phone]

## 2015-11-11 NOTE — ED Notes (Signed)
Bilateral flank pain X 2 days. Vomiting, SOB. Dark urine, foul odor. Pt alert and oriented X4, active, cooperative, pt in NAD. RR even and unlabored, color WNL.

## 2015-11-11 NOTE — ED Notes (Signed)
Pt alert and oriented X4, active, cooperative, pt in NAD. RR even and unlabored, color WNL.  Pt informed to return if any life threatening symptoms occur.   

## 2015-12-28 IMAGING — CR DG CHEST 2V
2 series · 2 of 2 positions shown · non-contrast
Comparison: September 06, 2015

CLINICAL DATA: Cough and vomiting.  Shortness of breath.

EXAM:
CHEST  2 VIEW

[chest pa]
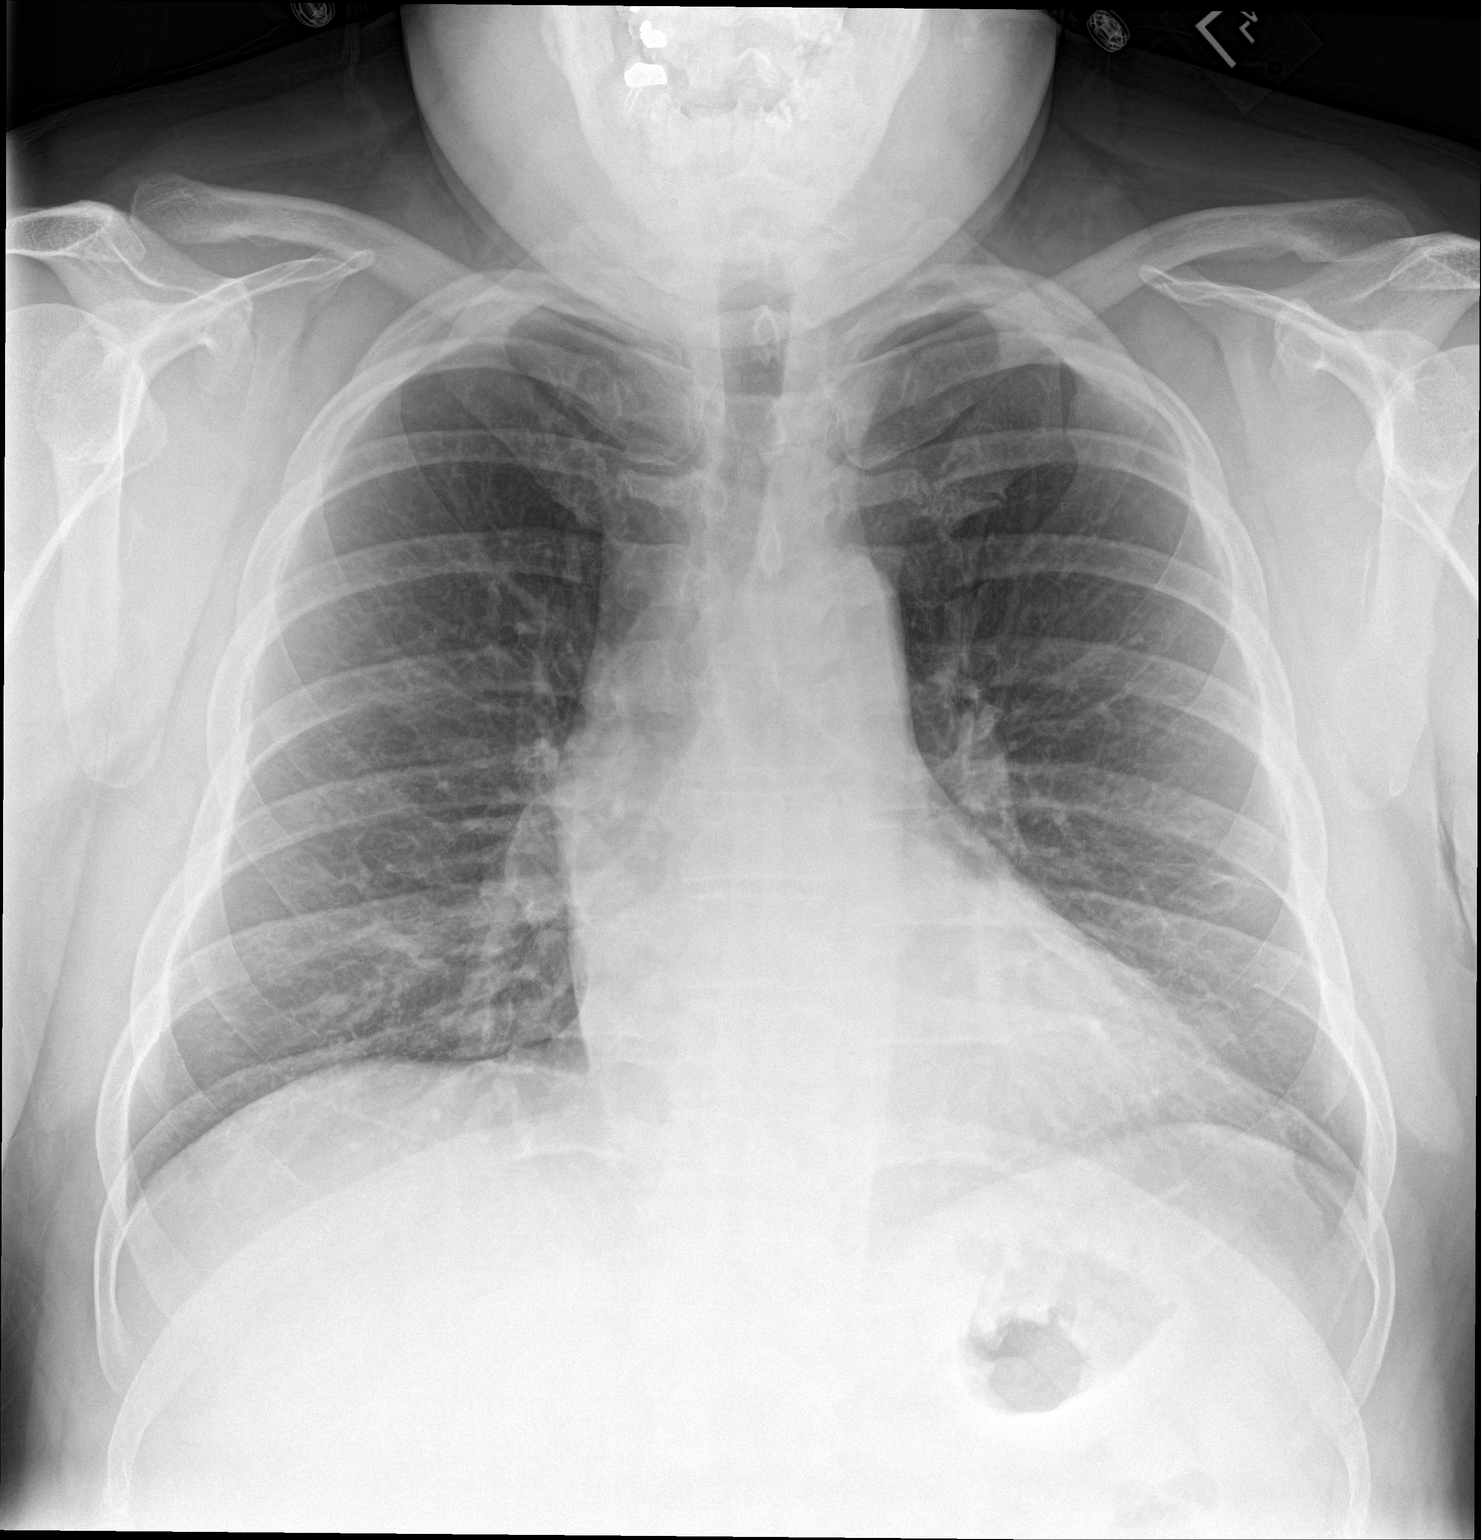

[chest lat]
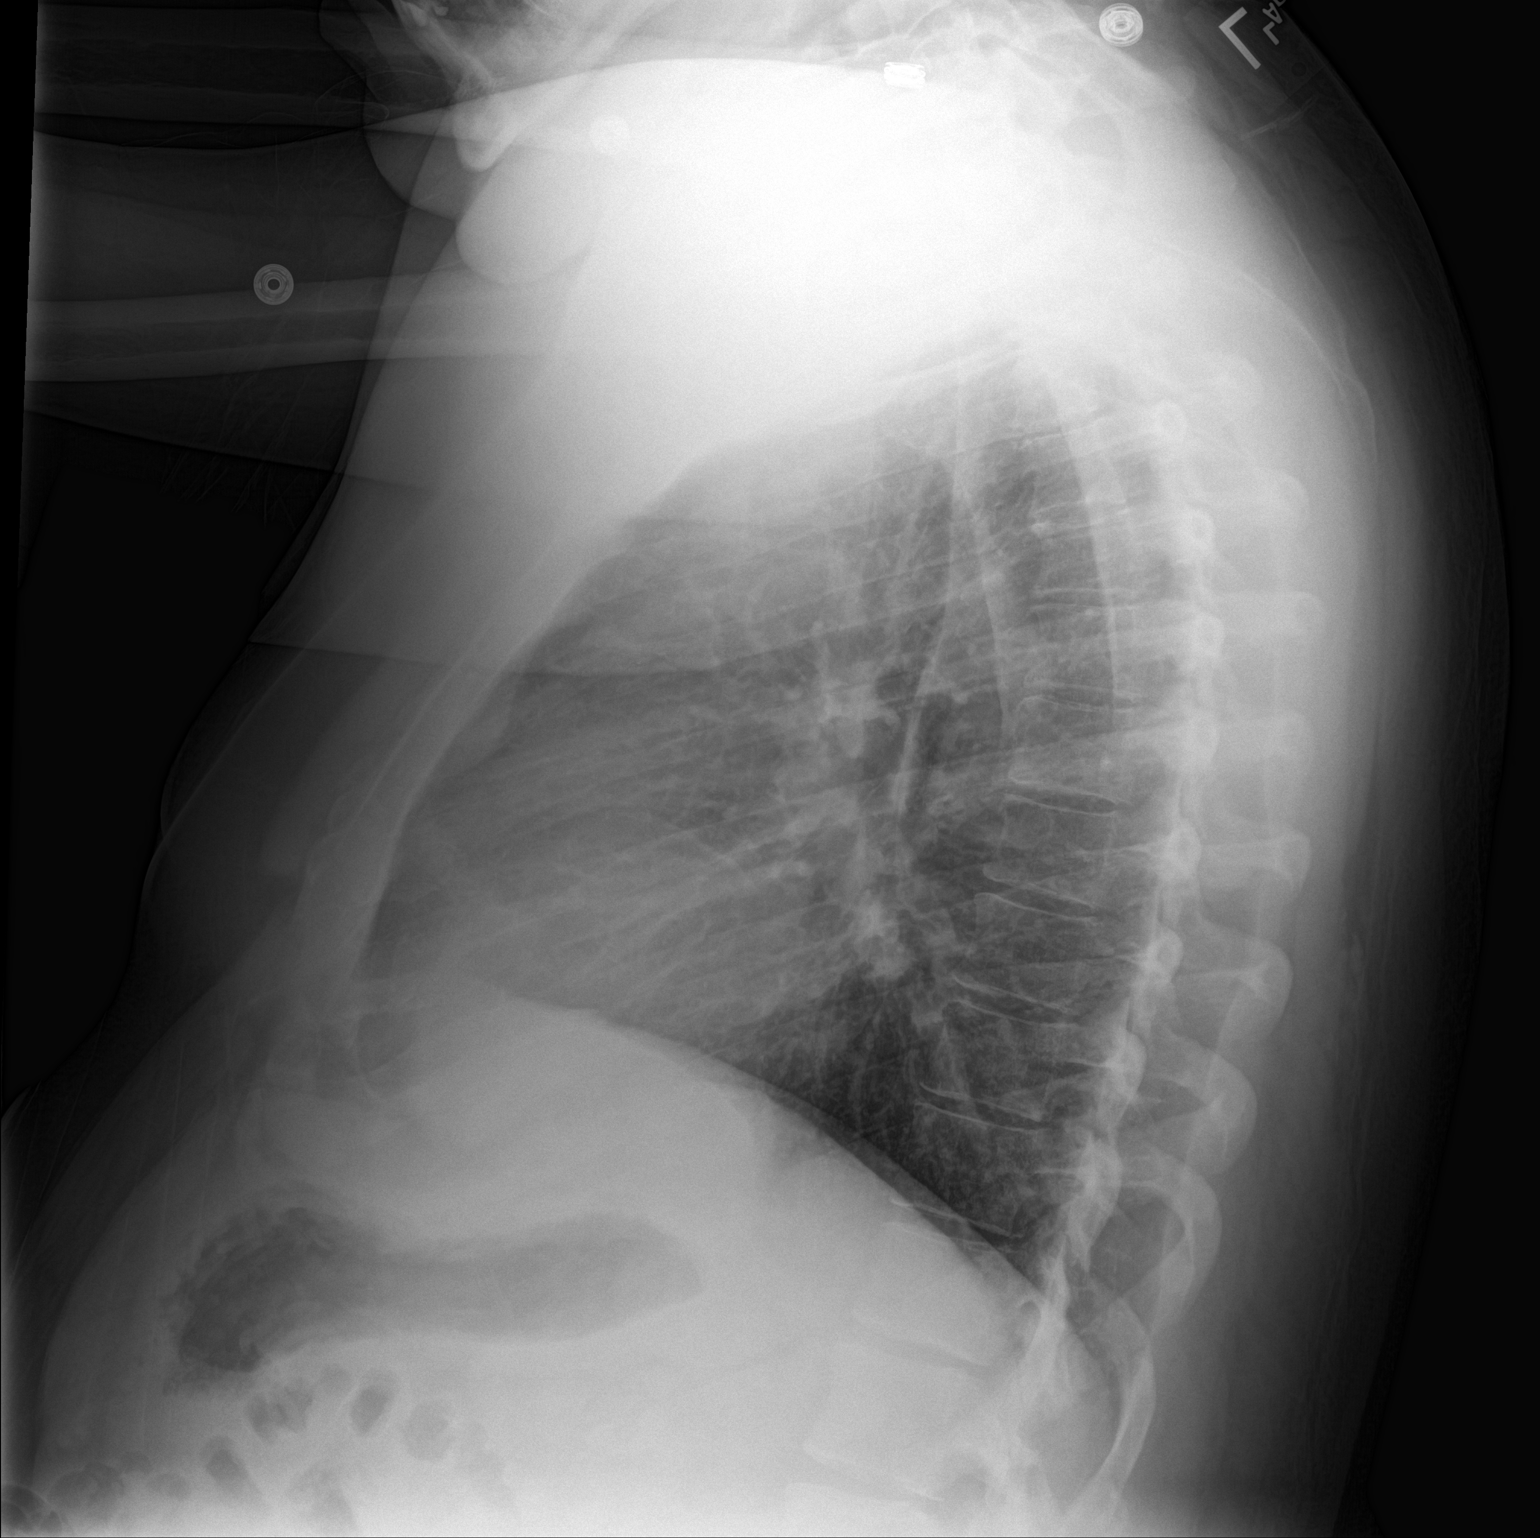

[2 of 2 positions shown; findings below may reference images not displayed]

FINDINGS: There is a probable nipple shadow on the right. There is no edema or
consolidation. The heart size and pulmonary vascularity are normal.
No adenopathy. No bone lesions.
IMPRESSION: No edema or consolidation. Probable nipple shadow on the right;
repeat study with nipple markers could confirm this observation.

## 2016-02-25 ENCOUNTER — Emergency Department: Payer: Medicare HMO

## 2016-02-25 ENCOUNTER — Encounter: Payer: Self-pay | Admitting: Emergency Medicine

## 2016-02-25 ENCOUNTER — Emergency Department
Admission: EM | Admit: 2016-02-25 | Discharge: 2016-02-25 | Disposition: A | Discharge status: Home / Self Care | Payer: Medicare HMO | Source: Home / Self Care | Attending: Emergency Medicine | Admitting: Emergency Medicine

## 2016-02-25 DIAGNOSIS — I10 Essential (primary) hypertension: Secondary | ICD-10-CM | POA: Insufficient documentation

## 2016-02-25 DIAGNOSIS — K5730 Diverticulosis of large intestine without perforation or abscess without bleeding: Secondary | ICD-10-CM

## 2016-02-25 DIAGNOSIS — F17210 Nicotine dependence, cigarettes, uncomplicated: Secondary | ICD-10-CM | POA: Insufficient documentation

## 2016-02-25 DIAGNOSIS — K5792 Diverticulitis of intestine, part unspecified, without perforation or abscess without bleeding: Secondary | ICD-10-CM

## 2016-02-25 LAB — BASIC METABOLIC PANEL
ANION GAP: 7 (ref 5–15)
BUN: 6 mg/dL (ref 6–20)
CHLORIDE: 101 mmol/L (ref 101–111)
CO2: 25 mmol/L (ref 22–32)
Calcium: 9 mg/dL (ref 8.9–10.3)
Creatinine, Ser: 0.81 mg/dL (ref 0.61–1.24)
GFR calc non Af Amer: >60 mL/min (ref >60)
Glucose, Bld: 118 mg/dL — ABNORMAL HIGH (ref 65–99)
Potassium: 3.6 mmol/L (ref 3.5–5.1)
Sodium: 133 mmol/L — ABNORMAL LOW (ref 135–145)

## 2016-02-25 LAB — URINALYSIS COMPLETE WITH MICROSCOPIC (ARMC ONLY)
Bacteria, UA: NONE SEEN
Bilirubin Urine: NEGATIVE
Glucose, UA: NEGATIVE mg/dL
Hgb urine dipstick: NEGATIVE
Ketones, ur: NEGATIVE mg/dL
Leukocytes, UA: NEGATIVE
Nitrite: NEGATIVE
Protein, ur: NEGATIVE mg/dL
Specific Gravity, Urine: 1.013 (ref 1.005–1.030)
pH: 8 (ref 5.0–8.0)

## 2016-02-25 LAB — CBC
HEMATOCRIT: 52.3 % — AB (ref 40.0–52.0)
Hemoglobin: 18.4 g/dL — ABNORMAL HIGH (ref 13.0–18.0)
MCH: 34 pg (ref 26.0–34.0)
MCHC: 35.2 g/dL (ref 32.0–36.0)
MCV: 96.6 fL (ref 80.0–100.0)
PLATELETS: 202 10*3/uL (ref 150–440)
RBC: 5.41 MIL/uL (ref 4.40–5.90)
RDW: 13.5 % (ref 11.5–14.5)
WBC: 13.2 10*3/uL — AB (ref 3.8–10.6)

## 2016-02-25 MED ORDER — METRONIDAZOLE 500 MG PO TABS
ORAL_TABLET | ORAL | Status: AC
  Filled 2016-02-25: qty 1

## 2016-02-25 MED ORDER — METRONIDAZOLE 500 MG PO TABS: 500.0000 mg | ORAL_TABLET | Freq: Two times a day (BID) | ORAL | Status: DC

## 2016-02-25 MED ORDER — HYDROMORPHONE HCL 1 MG/ML IJ SOLN
INTRAMUSCULAR | Status: AC
  Administered 2016-02-25: 1 mg via INTRAVENOUS
  Filled 2016-02-25: qty 1

## 2016-02-25 MED ORDER — ONDANSETRON HCL 4 MG/2ML IJ SOLN
INTRAMUSCULAR | Status: AC
  Filled 2016-02-25: qty 2

## 2016-02-25 MED ORDER — HYDROMORPHONE HCL 1 MG/ML IJ SOLN
1.0000 mg | Freq: Once | INTRAMUSCULAR | Status: AC
  Administered 2016-02-25: 1 mg via INTRAVENOUS

## 2016-02-25 MED ORDER — METRONIDAZOLE 500 MG PO TABS
500.0000 mg | ORAL_TABLET | Freq: Once | ORAL | Status: AC
  Administered 2016-02-25: 500 mg via ORAL

## 2016-02-25 MED ORDER — BARIUM SULFATE 2.1 % PO SUSP
450.0000 mL | ORAL | Status: AC
  Administered 2016-02-25: 900 mL via ORAL
  Filled 2016-02-25: qty 450

## 2016-02-25 MED ORDER — ONDANSETRON HCL 4 MG/2ML IJ SOLN
4.0000 mg | Freq: Once | INTRAMUSCULAR | Status: AC
  Administered 2016-02-25: 4 mg via INTRAVENOUS

## 2016-02-25 MED ORDER — CIPROFLOXACIN HCL 500 MG PO TABS
ORAL_TABLET | ORAL | Status: AC
  Filled 2016-02-25: qty 1

## 2016-02-25 MED ORDER — HYDROMORPHONE HCL 1 MG/ML IJ SOLN
INTRAMUSCULAR | Status: AC
  Filled 2016-02-25: qty 1

## 2016-02-25 MED ORDER — CIPROFLOXACIN HCL 500 MG PO TABS
500.0000 mg | ORAL_TABLET | Freq: Once | ORAL | Status: AC
  Administered 2016-02-25: 500 mg via ORAL

## 2016-02-25 MED ORDER — ONDANSETRON HCL 4 MG/2ML IJ SOLN
INTRAMUSCULAR | Status: AC
  Administered 2016-02-25: 4 mg via INTRAVENOUS
  Filled 2016-02-25: qty 2

## 2016-02-25 MED ORDER — SODIUM CHLORIDE 0.9 % IV BOLUS (SEPSIS)
1000.0000 mL | Freq: Once | INTRAVENOUS | Status: AC
  Administered 2016-02-25: 1000 mL via INTRAVENOUS

## 2016-02-25 MED ORDER — CIPROFLOXACIN HCL 500 MG PO TABS
500.0000 mg | ORAL_TABLET | Freq: Two times a day (BID) | ORAL | Status: DC
Start: 2016-02-25 — End: 2017-01-25

## 2016-02-25 NOTE — ED Provider Notes (Signed)
[facility] Emergency Department Provider Note   ____________________________________________  Time seen: Approximately 3pm I have reviewed the triage vital signs and the triage nursing note.  HISTORY  Chief Complaint Urinary Retention and Flank Pain   Historian Patient  HPI Caleb [name] is a 40 y.o. male who is here with complaint of lower abdominal and right lower quadrant pain for about 3 days. This morning woke up and was having trouble making urine. Denies history of UTI or kidney stones in the past. Pain is currently 9 out of 10. He's had some decreased by mouth intake. No diarrhea. No vomiting.Nothing makes it worse or better.    Past Medical History  Diagnosis Date  . Borderline diabetes   . Hypertension   . Hypercholesteremia   . GERD (gastroesophageal reflux disease)   . Knee pain, bilateral   . PTSD (post-traumatic stress disorder)   . History of degenerative disc disease     There are no active problems to display for this patient.   Past Surgical History  Procedure Laterality Date  . Knee surgeries      Current Outpatient Rx  Name  Route  Sig  Dispense  Refill  . atorvastatin (LIPITOR) 80 MG tablet   Oral   Take 1 tablet by mouth at bedtime.       3   . ciprofloxacin (CIPRO) 500 MG tablet   Oral   Take 1 tablet (500 mg total) by mouth 2 (two) times daily.   20 tablet   0   . diazepam (VALIUM) 5 MG tablet   Oral   Take 5 mg by mouth 2 (two) times daily.         . diclofenac sodium (VOLTAREN) 1 % GEL   Topical   Apply 2 g topically 4 (four) times daily.         . diphenhydrAMINE (BENADRYL) 25 mg capsule   Oral   Take 50 mg by mouth every 6 (six) hours as needed for itching or allergies.         Marland Kitchen gabapentin (NEURONTIN) 100 MG capsule   Oral   Take 300 mg by mouth at bedtime.      11   . ibuprofen (ADVIL,MOTRIN) 800 MG tablet   Oral   Take 1 tablet (800 mg total) by mouth every 8 (eight) hours as  needed.   30 tablet   0   . losartan-hydrochlorothiazide (HYZAAR) 100-25 MG per tablet   Oral   Take 1 tablet by mouth daily.      11   . methocarbamol (ROBAXIN-750) 750 MG tablet   Oral   Take 1 tablet (750 mg total) by mouth every 8 (eight) hours as needed for muscle spasms.   30 tablet   0   . metroNIDAZOLE (FLAGYL) 500 MG tablet   Oral   Take 1 tablet (500 mg total) by mouth 2 (two) times daily.   20 tablet   0   . ondansetron (ZOFRAN) 4 MG tablet   Oral   Take 1 tablet (4 mg total) by mouth daily as needed for nausea or vomiting.   10 tablet   0   . oxyCODONE (ROXICODONE) 15 MG immediate release tablet   Oral   Take 15 mg by mouth every 6 (six) hours as needed. For pain.      0   . pantoprazole (PROTONIX) 40 MG tablet   Oral   Take 1 tablet by mouth daily.         Marland Kitchen  predniSONE (STERAPRED UNI-PAK 21 TAB) 10 MG (21) TBPK tablet      Take 6 tablets on day 1 Take 5 tablets on day 2 Take 4 tablets on day 3 Take 3 tablets on day 4 Take 2 tablets on day 5 Take 1 tablet on day 6   21 tablet   0     Allergies Bee pollen; Contrast media; and Metformin and related  No family history on file.  Social History Social History  Substance Use Topics  . Smoking status: Current Every Day Smoker -- 1.00 packs/day    Types: Cigarettes  . Smokeless tobacco: Never Used  . Alcohol Use: 2.4 oz/week    4 Cans of beer per week     Comment: occas.     Review of Systems  Constitutional: Negative for fever. Eyes: Negative for visual changes. ENT: Negative for sore throat. Cardiovascular: Negative for chest pain. Respiratory: Negative for shortness of breath. Gastrointestinal: Positive for history of present illness. Genitourinary: Positive for dysuria. Musculoskeletal: Negative for extremity pain Skin: Negative for rash. Neurological: Negative for headache. 10 point Review of Systems otherwise negative ____________________________________________   PHYSICAL  EXAM:  VITAL SIGNS: ED Triage Vitals  Enc Vitals Group     BP 02/25/16 1210 188/114 mmHg     Pulse Rate 02/25/16 1210 81     Resp 02/25/16 1210 18     Temp 02/25/16 1210 98.1 F (36.7 C)     Temp Source 02/25/16 1210 Oral     SpO2 02/25/16 1210 97 %     Weight 02/25/16 1207 255 lb (115.667 kg)     Height 02/25/16 1207 6' (1.829 m)     Head Cir --      Peak Flow --      Pain Score 02/25/16 1207 9     Pain Loc --      Pain Edu? --      Excl. in [place]? --      Constitutional: Alert and oriented. Well appearing overall the complaining of pain in his abdomen and holding his lower abdomen.Marland Kitchen HEENT   Head: Normocephalic and atraumatic.      Eyes: Conjunctivae are normal. PERRL. Normal extraocular movements.      Ears:         Nose: No congestion/rhinnorhea.   Mouth/Throat: Mucous membranes are moist.   Neck: No stridor. Cardiovascular/Chest: Normal rate, regular rhythm.  No murmurs, rubs, or gallops. Respiratory: Normal respiratory effort without tachypnea nor retractions. Breath sounds are clear and equal bilaterally. No wheezes/rales/rhonchi. Gastrointestinal: Soft. No distention, no guarding, no rebound. Moderate tenderness to palpation in the suprapubic, right lower quadrant, and periumbilical area.  Genitourinary/rectal:Deferred Musculoskeletal: Nontender with normal range of motion in all extremities. No joint effusions.  No lower extremity tenderness.  No edema. Neurologic:  Normal speech and language. No gross or focal neurologic deficits are appreciated. Skin:  Skin is warm, dry and intact. No rash noted. Psychiatric: Mood and affect are normal. Speech and behavior are normal. Patient exhibits appropriate insight and judgment.  ____________________________________________   EKG I, Lisa Roca, MD, the attending physician have personally viewed and interpreted all ECGs.  None ____________________________________________  LABS (pertinent  positives/negatives)  Metabolic panel without significant abnormalities Urinalysis negative White blood count 13.2, hemoglobin is 18.4 and platelet count 202 Urine culture sent  ____________________________________________  RADIOLOGY All Xrays were viewed by me. Imaging interpreted by Radiologist.  CT abdomen and pelvis without contrast due to allergy to IV contrast:  IMPRESSION: Acute  diverticulitis involving the proximal descending colon. No evidence of focal abscess or free intraperitoneal air. __________________________________________  PROCEDURES  Procedure(s) performed: None  Critical Care performed: None  ____________________________________________   ED COURSE / ASSESSMENT AND PLAN  Pertinent labs & imaging results that were available during my care of the patient were reviewed by me and considered in my medical decision making (see chart for details).   Patient's labs are concerning only for mildly elevated white blood cell count, but his clinical exam is more concerning with 9 out of 10 pain which is been persistent and increasing over the past 3 days. Location wise him a little concerned about acute appendicitis. I discussed with him resources benefit of CT. He's had no abdominal CT in 2011 and 2016. We are going to proceed for evaluation of the abdominal pain.  ----------------------------------------- 5:15 PM on 02/25/2016 -----------------------------------------  Patient had several doses of IV Dilaudid for pain and judgment here number spread.  CT scan shows acute diverticula is without sign of emergency complication such as abscess or perforation. I discussed with him outpatient management on by mouth antibiotics and his home pain medication which is 15 mg Percocet per his pain clinic, versus inpatient hospitalization for pain control as he is required multiple doses of IV Dilaudid.  Patient states he would like to go home and he does not want to stay in  the hospital.  He will follow with his primary care physician [facility] family practice next week, he will call Monday. If anything comes up over the weekend, he will certainly come back to emergency department.    CONSULTATIONS:   None   Patient / Family / Caregiver informed of clinical course, medical decision-making process, and agree with plan.   I discussed return precautions, follow-up instructions, and discharged instructions with patient and/or family.   ___________________________________________   FINAL CLINICAL IMPRESSION(S) / ED DIAGNOSES   Final diagnoses:  Diverticulitis of intestine without perforation or abscess without bleeding              Note: This dictation was prepared with Dragon dictation. Any transcriptional errors that result from this process are unintentional   Lisa Roca, MD 02/25/16 1722

## 2016-02-25 NOTE — ED Notes (Signed)
Patient transported to CT 

## 2016-02-25 NOTE — ED Notes (Addendum)
Bladder scan 30ml

## 2016-02-25 NOTE — ED Notes (Signed)
Pain meds given iv.  siderails up x 2.

## 2016-02-25 NOTE — ED Notes (Signed)
Pt reports nausea.  meds given.  Family at bedside.

## 2016-02-25 NOTE — Discharge Instructions (Signed)
You were evaluated for abdominal pain and found to have diverticulitis and are being treated with antibiotics Cipro and Flagyl.  We discussed hospital management due to significant pain, versus outpatient treatment with antibiotics and follow up with her primary care physician. U chose to go ahead and go home. Return to emergency department for any worsening condition. Worsening abdominal pain, black or bloody stools, vomiting or vomiting blood, fever, or any other symptoms concerning to you.   Diverticulitis Diverticulitis is when small pockets that have formed in your colon (large intestine) become infected or swollen. HOME CARE  Follow your doctor's instructions.  Follow a special diet if told by your doctor.  When you feel better, your doctor may tell you to change your diet. You may be told to eat a lot of fiber. Fruits and vegetables are good sources of fiber. Fiber makes it easier to poop (have bowel movements).  Take supplements or probiotics as told by your doctor.  Only take medicines as told by your doctor.  Keep all follow-up visits with your doctor. GET HELP IF:  Your pain does not get better.  You have a hard time eating food.  You are not pooping like normal. GET HELP RIGHT AWAY IF:  Your pain gets worse.  Your problems do not get better.  Your problems suddenly get worse.  You have a fever.  You keep throwing up (vomiting).  You have bloody or black, tarry poop (stool). MAKE SURE YOU:   Understand these instructions.  Will watch your condition.  Will get help right away if you are not doing well or get worse.   This information is not intended to replace advice given to you by your health care provider. Make sure you discuss any questions you have with your health care provider.   Document Released: 04/24/2008 Document Revised: 11/11/2013 Document Reviewed: 10/01/2013 Elsevier Interactive Patient Education [employer].

## 2016-02-25 NOTE — ED Notes (Signed)
RLQ abd "stabbing" pain and right flank pain for a few days now, urinary retention since this am, no hx of kidney stones.

## 2016-02-26 ENCOUNTER — Inpatient Hospital Stay
Admission: EM | Admit: 2016-02-26 | Discharge: 2016-02-27 | DRG: 392 | Disposition: A | Discharge status: Home / Self Care | Payer: Medicare HMO | Attending: Internal Medicine | Admitting: Internal Medicine

## 2016-02-26 ENCOUNTER — Encounter: Payer: Self-pay | Admitting: Emergency Medicine

## 2016-02-26 DIAGNOSIS — R7303 Prediabetes: Secondary | ICD-10-CM | POA: Diagnosis present

## 2016-02-26 DIAGNOSIS — Z7952 Long term (current) use of systemic steroids: Secondary | ICD-10-CM | POA: Diagnosis not present

## 2016-02-26 DIAGNOSIS — G8929 Other chronic pain: Secondary | ICD-10-CM | POA: Diagnosis present

## 2016-02-26 DIAGNOSIS — M25562 Pain in left knee: Secondary | ICD-10-CM | POA: Diagnosis present

## 2016-02-26 DIAGNOSIS — Z809 Family history of malignant neoplasm, unspecified: Secondary | ICD-10-CM

## 2016-02-26 DIAGNOSIS — G629 Polyneuropathy, unspecified: Secondary | ICD-10-CM | POA: Diagnosis present

## 2016-02-26 DIAGNOSIS — K5792 Diverticulitis of intestine, part unspecified, without perforation or abscess without bleeding: Principal | ICD-10-CM | POA: Diagnosis present

## 2016-02-26 DIAGNOSIS — Z888 Allergy status to other drugs, medicaments and biological substances status: Secondary | ICD-10-CM | POA: Diagnosis not present

## 2016-02-26 DIAGNOSIS — M25561 Pain in right knee: Secondary | ICD-10-CM | POA: Diagnosis present

## 2016-02-26 DIAGNOSIS — K5732 Diverticulitis of large intestine without perforation or abscess without bleeding: Secondary | ICD-10-CM | POA: Insufficient documentation

## 2016-02-26 DIAGNOSIS — F4310 Post-traumatic stress disorder, unspecified: Secondary | ICD-10-CM | POA: Diagnosis present

## 2016-02-26 DIAGNOSIS — K219 Gastro-esophageal reflux disease without esophagitis: Secondary | ICD-10-CM | POA: Diagnosis present

## 2016-02-26 DIAGNOSIS — Z833 Family history of diabetes mellitus: Secondary | ICD-10-CM | POA: Diagnosis not present

## 2016-02-26 DIAGNOSIS — Z79899 Other long term (current) drug therapy: Secondary | ICD-10-CM

## 2016-02-26 DIAGNOSIS — F17210 Nicotine dependence, cigarettes, uncomplicated: Secondary | ICD-10-CM | POA: Diagnosis present

## 2016-02-26 DIAGNOSIS — Z91030 Bee allergy status: Secondary | ICD-10-CM | POA: Diagnosis not present

## 2016-02-26 DIAGNOSIS — Z91041 Radiographic dye allergy status: Secondary | ICD-10-CM

## 2016-02-26 DIAGNOSIS — E7800 Pure hypercholesterolemia, unspecified: Secondary | ICD-10-CM | POA: Diagnosis present

## 2016-02-26 DIAGNOSIS — I10 Essential (primary) hypertension: Secondary | ICD-10-CM | POA: Diagnosis present

## 2016-02-26 HISTORY — DX: Anxiety disorder, unspecified: F41.9

## 2016-02-26 LAB — CBC WITH DIFFERENTIAL/PLATELET
BASOS ABS: 0.1 10*3/uL (ref 0–0.1)
Eosinophils Absolute: 0.7 10*3/uL (ref 0–0.7)
Eosinophils Relative: 7 %
HEMATOCRIT: 50.9 % (ref 40.0–52.0)
HEMOGLOBIN: 18.3 g/dL — AB (ref 13.0–18.0)
Lymphocytes Relative: 32 %
Lymphs Abs: 2.9 10*3/uL (ref 1.0–3.6)
MCH: 34.2 pg — ABNORMAL HIGH (ref 26.0–34.0)
MCHC: 35.9 g/dL (ref 32.0–36.0)
MCV: 95.2 fL (ref 80.0–100.0)
Monocytes Absolute: 0.8 10*3/uL (ref 0.2–1.0)
Monocytes Relative: 8 %
NEUTROS ABS: 4.8 10*3/uL (ref 1.4–6.5)
Platelets: 204 10*3/uL (ref 150–440)
RBC: 5.35 MIL/uL (ref 4.40–5.90)
RDW: 13.7 % (ref 11.5–14.5)
WBC: 9.3 10*3/uL (ref 3.8–10.6)

## 2016-02-26 LAB — COMPREHENSIVE METABOLIC PANEL
ALBUMIN: 4.2 g/dL (ref 3.5–5.0)
ALK PHOS: 100 U/L (ref 38–126)
ALT: 59 U/L (ref 17–63)
AST: 37 U/L (ref 15–41)
Anion gap: 6 (ref 5–15)
BILIRUBIN TOTAL: 0.5 mg/dL (ref 0.3–1.2)
BUN: 5 mg/dL — AB (ref 6–20)
CALCIUM: 9.2 mg/dL (ref 8.9–10.3)
CO2: 28 mmol/L (ref 22–32)
Chloride: 100 mmol/L — ABNORMAL LOW (ref 101–111)
Creatinine, Ser: 1.01 mg/dL (ref 0.61–1.24)
GFR calc Af Amer: >60 mL/min (ref >60)
GFR calc non Af Amer: >60 mL/min (ref >60)
GLUCOSE: 123 mg/dL — AB (ref 65–99)
POTASSIUM: 3.4 mmol/L — AB (ref 3.5–5.1)
Sodium: 134 mmol/L — ABNORMAL LOW (ref 135–145)
TOTAL PROTEIN: 7.7 g/dL (ref 6.5–8.1)

## 2016-02-26 LAB — URINALYSIS COMPLETE WITH MICROSCOPIC (ARMC ONLY)
BACTERIA UA: NONE SEEN
BILIRUBIN URINE: NEGATIVE
Glucose, UA: NEGATIVE mg/dL
Hgb urine dipstick: NEGATIVE
Ketones, ur: NEGATIVE mg/dL
LEUKOCYTES UA: NEGATIVE
Nitrite: NEGATIVE
PH: 7 (ref 5.0–8.0)
Protein, ur: NEGATIVE mg/dL
RBC / HPF: NONE SEEN RBC/hpf (ref 0–5)
SQUAMOUS EPITHELIAL / LPF: NONE SEEN
Specific Gravity, Urine: 1.001 — ABNORMAL LOW (ref 1.005–1.030)
WBC, UA: NONE SEEN WBC/hpf (ref 0–5)

## 2016-02-26 LAB — LIPASE, BLOOD: Lipase: 15 U/L (ref 11–51)

## 2016-02-26 MED ORDER — METRONIDAZOLE IN NACL 5-0.79 MG/ML-% IV SOLN
500.0000 mg | Freq: Once | INTRAVENOUS | Status: AC
  Administered 2016-02-26: 500 mg via INTRAVENOUS
  Filled 2016-02-26: qty 100

## 2016-02-26 MED ORDER — OXYCODONE HCL 5 MG PO TABS
15.0000 mg | ORAL_TABLET | Freq: Every day | ORAL | Status: DC
  Administered 2016-02-26 – 2016-02-27 (×4): 15 mg via ORAL
  Filled 2016-02-26 (×3): qty 3

## 2016-02-26 MED ORDER — DIAZEPAM 5 MG PO TABS
10.0000 mg | ORAL_TABLET | Freq: Every day | ORAL | Status: DC
Start: 2016-02-26 — End: 2016-02-27
  Administered 2016-02-26: 10 mg via ORAL
  Filled 2016-02-26: qty 2

## 2016-02-26 MED ORDER — HYDROMORPHONE HCL 1 MG/ML IJ SOLN
2.0000 mg | Freq: Once | INTRAMUSCULAR | Status: AC
  Administered 2016-02-26: 2 mg via INTRAVENOUS
  Filled 2016-02-26: qty 2

## 2016-02-26 MED ORDER — DICLOFENAC SODIUM 1 % TD GEL
2.0000 g | Freq: Four times a day (QID) | TRANSDERMAL | Status: DC
  Administered 2016-02-27: 2 g via TOPICAL
  Filled 2016-02-26: qty 100

## 2016-02-26 MED ORDER — SODIUM CHLORIDE 0.9 % IV SOLN
Freq: Once | INTRAVENOUS | Status: AC
  Administered 2016-02-26: 17:00:00 via INTRAVENOUS

## 2016-02-26 MED ORDER — ONDANSETRON HCL 4 MG/2ML IJ SOLN
4.0000 mg | Freq: Once | INTRAMUSCULAR | Status: AC
  Administered 2016-02-26: 4 mg via INTRAVENOUS
  Filled 2016-02-26: qty 2

## 2016-02-26 MED ORDER — ENOXAPARIN SODIUM 40 MG/0.4ML ~~LOC~~ SOLN
40.0000 mg | SUBCUTANEOUS | Status: DC
  Filled 2016-02-26: qty 0.4

## 2016-02-26 MED ORDER — ONDANSETRON HCL 4 MG/2ML IJ SOLN
4.0000 mg | Freq: Four times a day (QID) | INTRAMUSCULAR | Status: DC | PRN
  Administered 2016-02-26: 4 mg via INTRAVENOUS
  Filled 2016-02-26: qty 2

## 2016-02-26 MED ORDER — CIPROFLOXACIN IN D5W 400 MG/200ML IV SOLN
400.0000 mg | Freq: Two times a day (BID) | INTRAVENOUS | Status: DC
  Administered 2016-02-26 – 2016-02-27 (×2): 400 mg via INTRAVENOUS
  Filled 2016-02-26 (×3): qty 200

## 2016-02-26 MED ORDER — ACETAMINOPHEN 325 MG PO TABS
650.0000 mg | ORAL_TABLET | Freq: Four times a day (QID) | ORAL | Status: DC | PRN
  Administered 2016-02-27: 650 mg via ORAL
  Filled 2016-02-26: qty 2

## 2016-02-26 MED ORDER — METRONIDAZOLE IN NACL 5-0.79 MG/ML-% IV SOLN
500.0000 mg | Freq: Three times a day (TID) | INTRAVENOUS | Status: DC
  Administered 2016-02-27: 500 mg via INTRAVENOUS
  Filled 2016-02-26 (×3): qty 100

## 2016-02-26 MED ORDER — OXYCODONE HCL 5 MG PO TABS
ORAL_TABLET | ORAL | Status: AC
  Filled 2016-02-26: qty 3

## 2016-02-26 MED ORDER — ONDANSETRON HCL 4 MG PO TABS: 4.0000 mg | ORAL_TABLET | Freq: Four times a day (QID) | ORAL | Status: DC | PRN

## 2016-02-26 MED ORDER — ATORVASTATIN CALCIUM 20 MG PO TABS
80.0000 mg | ORAL_TABLET | Freq: Every day | ORAL | Status: DC
  Administered 2016-02-26: 80 mg via ORAL
  Filled 2016-02-26: qty 4

## 2016-02-26 MED ORDER — POTASSIUM CHLORIDE IN NACL 20-0.9 MEQ/L-% IV SOLN
INTRAVENOUS | Status: DC
  Administered 2016-02-26 – 2016-02-27 (×2): via INTRAVENOUS
  Filled 2016-02-26 (×4): qty 1000

## 2016-02-26 MED ORDER — DIPHENHYDRAMINE HCL 25 MG PO CAPS
50.0000 mg | ORAL_CAPSULE | Freq: Four times a day (QID) | ORAL | Status: DC | PRN
  Administered 2016-02-26: 50 mg via ORAL
  Filled 2016-02-26: qty 2

## 2016-02-26 MED ORDER — ACETAMINOPHEN 650 MG RE SUPP: 650.0000 mg | Freq: Four times a day (QID) | RECTAL | Status: DC | PRN

## 2016-02-26 MED ORDER — GABAPENTIN 300 MG PO CAPS
300.0000 mg | ORAL_CAPSULE | Freq: Every day | ORAL | Status: DC
  Administered 2016-02-26: 300 mg via ORAL
  Filled 2016-02-26: qty 1

## 2016-02-26 MED ORDER — CIPROFLOXACIN IN D5W 400 MG/200ML IV SOLN
400.0000 mg | Freq: Once | INTRAVENOUS | Status: DC
  Filled 2016-02-26: qty 200

## 2016-02-26 MED ORDER — LOSARTAN POTASSIUM 50 MG PO TABS
100.0000 mg | ORAL_TABLET | Freq: Every day | ORAL | Status: DC
  Administered 2016-02-26 – 2016-02-27 (×2): 100 mg via ORAL
  Filled 2016-02-26 (×2): qty 2

## 2016-02-26 MED ORDER — LOSARTAN POTASSIUM-HCTZ 100-25 MG PO TABS: 1.0000 | ORAL_TABLET | Freq: Every day | ORAL | Status: DC

## 2016-02-26 MED ORDER — DIPHENHYDRAMINE HCL 25 MG PO CAPS
25.0000 mg | ORAL_CAPSULE | Freq: Once | ORAL | Status: AC
  Administered 2016-02-26: 25 mg via ORAL
  Filled 2016-02-26: qty 1

## 2016-02-26 MED ORDER — HYDROCHLOROTHIAZIDE 25 MG PO TABS
25.0000 mg | ORAL_TABLET | Freq: Every day | ORAL | Status: DC
  Administered 2016-02-26 – 2016-02-27 (×2): 25 mg via ORAL
  Filled 2016-02-26 (×2): qty 1

## 2016-02-26 MED ORDER — LABETALOL HCL 5 MG/ML IV SOLN
10.0000 mg | Freq: Once | INTRAVENOUS | Status: AC
  Administered 2016-02-26: 10 mg via INTRAVENOUS
  Filled 2016-02-26: qty 4

## 2016-02-26 MED ORDER — PANTOPRAZOLE SODIUM 40 MG PO TBEC
40.0000 mg | DELAYED_RELEASE_TABLET | Freq: Every day | ORAL | Status: DC
  Administered 2016-02-26 – 2016-02-27 (×2): 40 mg via ORAL
  Filled 2016-02-26 (×2): qty 1

## 2016-02-26 MED ORDER — OXYCODONE HCL 5 MG PO TABS: 5.0000 mg | ORAL_TABLET | ORAL | Status: DC | PRN

## 2016-02-26 MED ORDER — HYDROMORPHONE HCL 1 MG/ML IJ SOLN
1.0000 mg | INTRAMUSCULAR | Status: DC | PRN
  Administered 2016-02-26 – 2016-02-27 (×4): 1 mg via INTRAVENOUS
  Filled 2016-02-26 (×4): qty 1

## 2016-02-26 NOTE — H&P (Signed)
[facility] NAME: Caleb [name]    MR#:  [id number]  DATE OF BIRTH:  1976/07/29  DATE OF ADMISSION:  02/26/2016  PRIMARY CARE PHYSICIAN: Caleb Jarvis, MD   REQUESTING/REFERRING PHYSICIAN: Dr. Lenise Arena  CHIEF COMPLAINT:   Chief Complaint  Patient presents with  . Abdominal Pain    HISTORY OF PRESENT ILLNESS:  Caleb [name]  is a 40 y.o. male with a known history of Borderline diabetes, hypertension, hyperlipidemia, chronic knee pain, PTSD, GERD who presents to the hospital due to abdominal pain nausea vomiting ongoing for the past 2-4 days. Patient presented to the emergency room yesterday with similar symptoms underwent a CT scan of the abdomen and pelvis which showed acute diverticulitis. Patient was discharged on some oral pain meds and ciprofloxacin and Flagyl yesterday but his abdominal pain has gotten worse and therefore he returned to the ER for further evaluation. Patient denies any fever, chills, chest pain, shortness of breath or any other associated symptoms presently. Patient has failed oral outpatient therapy for acute diverticulitis and therefore hospitalist services were contacted further treatment and evaluation.  PAST MEDICAL HISTORY:   Past Medical History  Diagnosis Date  . Borderline diabetes   . Hypertension   . Hypercholesteremia   . GERD (gastroesophageal reflux disease)   . Knee pain, bilateral   . PTSD (post-traumatic stress disorder)   . History of degenerative disc disease     PAST SURGICAL HISTORY:   Past Surgical History  Procedure Laterality Date  . Knee surgeries      SOCIAL HISTORY:   Social History  Substance Use Topics  . Smoking status: Current Every Day Smoker -- 1.00 packs/day for 20 years    Types: Cigarettes  . Smokeless tobacco: Never Used  . Alcohol Use: 2.4 oz/week    4 Cans of beer per week     Comment: occas.     FAMILY HISTORY:   Family History   Problem Relation Age of Onset  . Diabetes Mother   . Cancer Paternal Grandmother   . Cancer Paternal Grandfather     DRUG ALLERGIES:   Allergies  Allergen Reactions  . Bee Pollen Anaphylaxis  . Contrast Media [Iodinated Diagnostic Agents] Anaphylaxis  . Metformin And Related Anaphylaxis    Unknown     REVIEW OF SYSTEMS:   Review of Systems  Constitutional: Negative for fever and weight loss.  HENT: Negative for congestion, nosebleeds and tinnitus.   Eyes: Negative for blurred vision, double vision and redness.  Respiratory: Negative for cough, hemoptysis and shortness of breath.   Cardiovascular: Negative for chest pain, orthopnea, leg swelling and PND.  Gastrointestinal: Positive for nausea, vomiting and abdominal pain. Negative for diarrhea and melena.  Genitourinary: Negative for dysuria, urgency and hematuria.  Musculoskeletal: Negative for joint pain and falls.  Neurological: Negative for dizziness, tingling, sensory change, focal weakness, seizures, weakness and headaches.  Endo/Heme/Allergies: Negative for polydipsia. Does not bruise/bleed easily.  Psychiatric/Behavioral: Negative for depression and memory loss. The patient is not nervous/anxious.     MEDICATIONS AT HOME:   Prior to Admission medications   Medication Sig Start Date End Date Taking? Authorizing Provider  atorvastatin (LIPITOR) 80 MG tablet Take 1 tablet by mouth at bedtime.  03/04/15  Yes Historical Provider, MD  ciprofloxacin (CIPRO) 500 MG tablet Take 1 tablet (500 mg total) by mouth 2 (two) times daily. 02/25/16  Yes Lisa Roca, MD  diazepam (VALIUM) 5 MG tablet Take 10  mg by mouth at bedtime.  01/14/15  Yes Historical Provider, MD  diclofenac sodium (VOLTAREN) 1 % GEL Apply 2 g topically 4 (four) times daily.   Yes Historical Provider, MD  diphenhydrAMINE (BENADRYL) 25 mg capsule Take 50 mg by mouth every 6 (six) hours as needed for itching or allergies.   Yes Historical Provider, MD  gabapentin  (NEURONTIN) 100 MG capsule Take 300 mg by mouth at bedtime. 08/24/15  Yes Historical Provider, MD  losartan-hydrochlorothiazide (HYZAAR) 100-25 MG per tablet Take 1 tablet by mouth daily. 02/25/15  Yes Historical Provider, MD  metroNIDAZOLE (FLAGYL) 500 MG tablet Take 1 tablet (500 mg total) by mouth 2 (two) times daily. 02/25/16  Yes Lisa Roca, MD  oxyCODONE (ROXICODONE) 15 MG immediate release tablet Take 15 mg by mouth 5 (five) times daily. For pain. 09/25/15  Yes Historical Provider, MD  pantoprazole (PROTONIX) 40 MG tablet Take 1 tablet by mouth daily. 12/15/14  Yes Historical Provider, MD  methocarbamol (ROBAXIN-750) 750 MG tablet Take 1 tablet (750 mg total) by mouth every 8 (eight) hours as needed for muscle spasms. 10/18/15   Victorino Dike, FNP  ondansetron (ZOFRAN) 4 MG tablet Take 1 tablet (4 mg total) by mouth daily as needed for nausea or vomiting. 11/11/15   Orbie Pyo, MD  predniSONE (STERAPRED UNI-PAK 21 TAB) 10 MG (21) TBPK tablet Take 6 tablets on day 1 Take 5 tablets on day 2 Take 4 tablets on day 3 Take 3 tablets on day 4 Take 2 tablets on day 5 Take 1 tablet on day 6 10/18/15   Cari B Triplett, FNP      VITAL SIGNS:  Blood pressure 181/101, pulse 72, temperature 97.8 F (36.6 C), temperature source Oral, resp. rate 16, height 6' (1.829 m), weight 115.667 kg (255 lb), SpO2 98 %.  PHYSICAL EXAMINATION:  Physical Exam  GENERAL:  40 y.o.-year-old patient lying in the bed in mild distress.   EYES: Pupils equal, round, reactive to light and accommodation. No scleral icterus. Extraocular muscles intact.  HEENT: Head atraumatic, normocephalic. Oropharynx and nasopharynx clear. No oropharyngeal erythema, moist oral mucosa  NECK:  Supple, no jugular venous distention. No thyroid enlargement, no tenderness.  LUNGS: Normal breath sounds bilaterally, no wheezing, rales, rhonchi. No use of accessory muscles of respiration.  CARDIOVASCULAR: S1, S2 RRR. No murmurs, rubs,  gallops, clicks.  ABDOMEN: Soft, tender in the left lower quadrant, no rebound, rigidity nondistended. Bowel sounds present. No organomegaly or mass.  EXTREMITIES: No pedal edema, cyanosis, or clubbing. + 2 pedal & radial pulses b/l.   NEUROLOGIC: Cranial nerves II through XII are intact. No focal Motor or sensory deficits appreciated b/l PSYCHIATRIC: The patient is alert and oriented x 3. Good affect.  SKIN: No obvious rash, lesion, or ulcer.   LABORATORY PANEL:   CBC  Recent Labs Lab 02/26/16 1653  WBC 9.3  HGB 18.3*  HCT 50.9  PLT 204   ------------------------------------------------------------------------------------------------------------------  Chemistries   Recent Labs Lab 02/26/16 1653  NA 134*  K 3.4*  CL 100*  CO2 28  GLUCOSE 123*  BUN 5*  CREATININE 1.01  CALCIUM 9.2  AST 37  ALT 59  ALKPHOS 100  BILITOT 0.5   ------------------------------------------------------------------------------------------------------------------  Cardiac Enzymes No results for input(s): TROPONINI in the last 168 hours. ------------------------------------------------------------------------------------------------------------------  RADIOLOGY:  Ct Abdomen Pelvis Wo Contrast  02/25/2016  CLINICAL DATA:  Right lower quadrant abdominal pain and right flank pain. EXAM: CT ABDOMEN AND PELVIS WITHOUT CONTRAST TECHNIQUE: Multidetector  CT imaging of the abdomen and pelvis was performed following the standard protocol without IV contrast. COMPARISON:  04/30/2015 FINDINGS: Lower chest:  Visualized lung bases are unremarkable. Hepatobiliary: Unenhanced appearance of the liver shows probable steatosis. The gallbladder appears unremarkable. Pancreas: Normal unenhanced appearance of the pancreas. Spleen: Normal unenhanced appearance of the spleen. Adrenals/Urinary Tract: The kidneys have a normal appearance bilaterally and show no evidence of hydronephrosis. No urinary tract calculi are  identified. The bladder is decompressed and unremarkable in appearance. Stomach/Bowel: Segmental inflammation of the colon noted in the proximal descending colon just below the spleen. Diverticular disease present and this most likely represents acute diverticulitis. Surrounding inflammation seen in the pericolic fat with a minimal amount of pericolic fluid. No focal abscess or free intraperitoneal air identified. No evidence of bowel obstruction. Vascular/Lymphatic: No lymphadenopathy. Other: No hernias identified. Musculoskeletal: Bony structures are unremarkable. IMPRESSION: Acute diverticulitis involving the proximal descending colon. No evidence of focal abscess or free intraperitoneal air. Electronically Signed   By: Aletta Edouard M.D.   On: 02/25/2016 17:02     IMPRESSION AND PLAN:   40 year old male with past medical history of hypertension, chronic knee pain, PTSD, borderline diabetes, degenerative disc disease, neuropathy presents to the hospital due to abdominal pain nausea vomiting and noted to have acute diverticulitis.  #1 acute diverticulitis-this is the cause of patient's abdominal pain nausea and vomiting. -Patient has failed outpatient oral therapy. He will be admitted and started on IV ciprofloxacin and Flagyl. -Continue supportive care with IV fluids, antiemetics, pain control. -We'll get surgical consult.  #2 chronic pain-patient has high threshold for pain. -Continue his scheduled oxycodone. Continue IV pain meds due to acute diverticulitis.  #3 GERD-continue Protonix.  #4 essential hypertension-continue losartan/HCTZ.  #5 anxiety-continue Valium.  #6 hyperlipidemia-continue atorvastatin.    All the records are reviewed and case discussed with ED provider. Management plans discussed with the patient, family and they are in agreement.  CODE STATUS: Full code  TOTAL TIME TAKING CARE OF THIS PATIENT: 45 minutes.    Henreitta Leber M.D on 02/26/2016 at 6:45  PM  Between 7am to 6pm - Pager - [phone]  After 6pm go to [email or web address] - password [email or web address] [place] [facility]  Office  [phone]  CC: Primary care physician; Caleb Jarvis, MD

## 2016-02-26 NOTE — ED Notes (Signed)
Patient arrives from home POV with complaint of lower abdominal pain. Patient was here yesterday and seen by Dr. Reita Cliche for same, diagnosed with diverticulitis. Patient instructed to return is pain worsens. Patient states pain has increased substantially, along with mild enlargement of his abdomen, Patient also notes darkened stool.

## 2016-02-26 NOTE — ED Provider Notes (Signed)
[facility] Emergency Department Provider Note     Time seen: ----------------------------------------- 4:51 PM on 02/26/2016 -----------------------------------------    I have reviewed the triage vital signs and the nursing notes.   HISTORY  Chief Complaint Abdominal Pain    HPI Caleb [name] is a 40 y.o. male who presents to ER going by private vehicle complaining of lower abdominal pain and vomiting. Patient was seen here yesterday for diverticulitis. His CT scan did not show any perforation or abscess. He returns for worsening pain, this is substantially worsenedand has been associated with vomiting. He has not had any blood in his stool or diarrhea. Pain is in the left lower quadrant. He denies fevers chills or other complaints.   Past Medical History  Diagnosis Date  . Borderline diabetes   . Hypertension   . Hypercholesteremia   . GERD (gastroesophageal reflux disease)   . Knee pain, bilateral   . PTSD (post-traumatic stress disorder)   . History of degenerative disc disease     There are no active problems to display for this patient.   Past Surgical History  Procedure Laterality Date  . Knee surgeries      Allergies Bee pollen; Contrast media; and Metformin and related  Social History Social History  Substance Use Topics  . Smoking status: Current Every Day Smoker -- 1.00 packs/day    Types: Cigarettes  . Smokeless tobacco: Never Used  . Alcohol Use: 2.4 oz/week    4 Cans of beer per week     Comment: occas.     Review of Systems Constitutional: Negative for fever. Eyes: Negative for visual changes. ENT: Negative for sore throat. Cardiovascular: Negative for chest pain. Respiratory: Negative for shortness of breath. Gastrointestinal: Positive for abdominal pain and vomiting Genitourinary: Negative for dysuria. Musculoskeletal: Negative for back pain. Skin: Negative for rash. Neurological: Negative for  headaches, focal weakness or numbness.  10-point ROS otherwise negative.  ____________________________________________   PHYSICAL EXAM:  VITAL SIGNS: ED Triage Vitals  Enc Vitals Group     BP 02/26/16 1631 181/117 mmHg     Pulse Rate 02/26/16 1631 82     Resp --      Temp 02/26/16 1631 97.8 F (36.6 C)     Temp Source 02/26/16 1631 Oral     SpO2 02/26/16 1631 95 %     Weight 02/26/16 1631 255 lb (115.667 kg)     Height 02/26/16 1631 6' (1.829 m)     Head Cir --      Peak Flow --      Pain Score 02/26/16 1632 10     Pain Loc --      Pain Edu? --      Excl. in [place]? --     Constitutional: Alert and oriented. Mild distress Eyes: Conjunctivae are normal. PERRL. Normal extraocular movements. ENT   Head: Normocephalic and atraumatic.   Nose: No congestion/rhinnorhea.   Mouth/Throat: Mucous membranes are moist.   Neck: No stridor. Cardiovascular: Normal rate, regular rhythm. Normal and symmetric distal pulses are present in all extremities. No murmurs, rubs, or gallops. Respiratory: Normal respiratory effort without tachypnea nor retractions. Breath sounds are clear and equal bilaterally. No wheezes/rales/rhonchi. Gastrointestinal: Left lower quadrant tenderness, no rebound or guarding. Normal bowel sounds. Musculoskeletal: Nontender with normal range of motion in all extremities. No joint effusions.  No lower extremity tenderness nor edema. Neurologic:  Normal speech and language. No gross focal neurologic deficits are appreciated.  Skin:  Skin  is warm, dry and intact. No rash noted. Psychiatric: Mood and affect are normal. Speech and behavior are normal. Patient exhibits appropriate insight and judgment. ____________________________________________  ED COURSE:  Pertinent labs & imaging results that were available during my care of the patient were reviewed by me and considered in my medical decision making (see chart for details). Patient was seen yesterday, has  had worsening pain with vomiting. He may require repeat CT imaging. We will give IV pain medicine, antiemetics and fluid. ____________________________________________    LABS (pertinent positives/negatives)  Labs Reviewed  CBC WITH DIFFERENTIAL/PLATELET - Abnormal; Notable for the following:    Hemoglobin 18.3 (*)    MCH 34.2 (*)    All other components within normal limits  COMPREHENSIVE METABOLIC PANEL - Abnormal; Notable for the following:    Sodium 134 (*)    Potassium 3.4 (*)    Chloride 100 (*)    Glucose, Bld 123 (*)    BUN 5 (*)    All other components within normal limits  LIPASE, BLOOD  URINALYSIS COMPLETEWITH MICROSCOPIC ([facility] ONLY)    ____________________________________________  FINAL ASSESSMENT AND PLAN  Diverticulitis  Plan: Patient with labs as dictated above. White blood cell count has improved, patient has worsening pain and is failed outpatient treatment. I don't think he has perforation or abscess at this time. I have consult to surgery but patient will be admitted by medicine. He is currently stable for admission. He would additional dose of Cipro and Flagyl IV.   Earleen Newport, MD   Earleen Newport, MD 02/26/16 [id number]

## 2016-02-26 NOTE — Progress Notes (Signed)
Pharmacy Antibiotic Note  Caleb [name] is a 39 y.o. male admitted on 02/26/2016 with intra-abdominal infection.  Pharmacy has been consulted for ciprofloxacin dosing. Patient is also prescribed metronidazole.   Plan: Order ciprofloxacin 400 mg IV q12h  Height: 6' (182.9 cm) Weight: 251 lb 4.8 oz (113.989 kg) IBW/kg (Calculated) : 77.6  Temp (24hrs), Avg:98 F (36.7 C), Min:97.8 F (36.6 C), Max:98.2 F (36.8 C)   Recent Labs Lab 02/25/16 1209 02/26/16 1653  WBC 13.2* 9.3  CREATININE 0.81 1.01    Estimated Creatinine Clearance: 128.1 mL/min (by C-G formula based on Cr of 1.01).    Allergies  Allergen Reactions  . Bee Pollen Anaphylaxis  . Contrast Media [Iodinated Diagnostic Agents] Anaphylaxis  . Metformin And Related Anaphylaxis    Unknown    Antimicrobials this admission: metronidazole 4/8 >>  ciprofloxacin 4/8 >>   Microbiology results: None  Thank you for allowing pharmacy to be a part of this patient's care.  Lenis Noon, PharmD Clinical Pharmacist 02/26/2016 8:58 PM

## 2016-02-26 NOTE — Consult Note (Signed)
Patient ID: Caleb [name], male   DOB: [birth year], 40 y.o.   MRN: [id number]  HPI Caleb [name] is a 40 y.o. male asked to see in consultation for Diverticulitis.  He started experiencing pain about 3 days ago is in the left lower quadrant and nonradiating. Pain is severe worsening with certain movement. He did have some nausea and vomited last night. He actually came to the emergency room yesterday and workup revealed evidence of diverticulitis without any complication. I reviewed his CT scan and there is no free air there is no abscess or any complications related to the diverticulitis. He was sent home with by mouth antibiotics Cipro and Flagyl but the pain exacerbated at [facility]. Current  white count has decreased since yesterday Of note he did have a colonoscopy 5 years ago and they removed 12 mm polyp and asked him to have a follow-up this year. This is the first episode of diverticulitis. Does have a history of chronic pain and PTSD.  HPI  Past Medical History  Diagnosis Date  . Borderline diabetes   . Hypertension   . Hypercholesteremia   . GERD (gastroesophageal reflux disease)   . Knee pain, bilateral   . PTSD (post-traumatic stress disorder)   . History of degenerative disc disease     Past Surgical History  Procedure Laterality Date  . Knee surgeries      Family History  Problem Relation Age of Onset  . Diabetes Mother   . Cancer Paternal Grandmother   . Cancer Paternal Grandfather     Social History Social History  Substance Use Topics  . Smoking status: Current Every Day Smoker -- 1.00 packs/day for 20 years    Types: Cigarettes  . Smokeless tobacco: Never Used  . Alcohol Use: 2.4 oz/week    4 Cans of beer per week     Comment: occas.     Allergies  Allergen Reactions  . Bee Pollen Anaphylaxis  . Contrast Media [Iodinated Diagnostic Agents] Anaphylaxis  . Metformin And Related Anaphylaxis    Unknown     Current Facility-Administered  Medications  Medication Dose Route Frequency Provider Last Rate Last Dose  . ciprofloxacin (CIPRO) IVPB 400 mg  400 mg Intravenous Once Earleen Newport, MD      . diphenhydrAMINE (BENADRYL) capsule 25 mg  25 mg Oral Once Earleen Newport, MD      . metroNIDAZOLE (FLAGYL) IVPB 500 mg  500 mg Intravenous Once Earleen Newport, MD 100 mL/hr at 02/26/16 1756 500 mg at 02/26/16 1756   Current Outpatient Prescriptions  Medication Sig Dispense Refill  . ciprofloxacin (CIPRO) 500 MG tablet Take 1 tablet (500 mg total) by mouth 2 (two) times daily. 20 tablet 0  . metroNIDAZOLE (FLAGYL) 500 MG tablet Take 1 tablet (500 mg total) by mouth 2 (two) times daily. 20 tablet 0  . atorvastatin (LIPITOR) 80 MG tablet Take 1 tablet by mouth at bedtime.   3  . diazepam (VALIUM) 5 MG tablet Take 5 mg by mouth 2 (two) times daily.    . diclofenac sodium (VOLTAREN) 1 % GEL Apply 2 g topically 4 (four) times daily.    . diphenhydrAMINE (BENADRYL) 25 mg capsule Take 50 mg by mouth every 6 (six) hours as needed for itching or allergies.    Marland Kitchen gabapentin (NEURONTIN) 100 MG capsule Take 300 mg by mouth at bedtime.  11  . losartan-hydrochlorothiazide (HYZAAR) 100-25 MG per tablet Take 1 tablet by mouth daily.  11  .  methocarbamol (ROBAXIN-750) 750 MG tablet Take 1 tablet (750 mg total) by mouth every 8 (eight) hours as needed for muscle spasms. 30 tablet 0  . ondansetron (ZOFRAN) 4 MG tablet Take 1 tablet (4 mg total) by mouth daily as needed for nausea or vomiting. 10 tablet 0  . oxyCODONE (ROXICODONE) 15 MG immediate release tablet Take 15 mg by mouth every 6 (six) hours as needed. For pain.  0  . pantoprazole (PROTONIX) 40 MG tablet Take 1 tablet by mouth daily.    . predniSONE (STERAPRED UNI-PAK 21 TAB) 10 MG (21) TBPK tablet Take 6 tablets on day 1 Take 5 tablets on day 2 Take 4 tablets on day 3 Take 3 tablets on day 4 Take 2 tablets on day 5 Take 1 tablet on day 6 21 tablet 0     Review of Systems A  10 point review of systems was asked and was negative except for the information on the HPI  Physical Exam Blood pressure 167/113, pulse 76, temperature 97.8 F (36.6 C), temperature source Oral, resp. rate 18, height 6' (1.829 m), weight 115.667 kg (255 lb), SpO2 97 %. CONSTITUTIONAL: No acute distress, obese EYES: Pupils are equal, round, and reactive to light, Sclera are non-icteric. EARS, NOSE, MOUTH AND THROAT: The oropharynx is clear. The oral mucosa is pink and moist. Hearing is intact to voice. LYMPH NODES:  Lymph nodes in the neck are normal. RESPIRATORY:  Lungs are clear. There is normal respiratory effort, with equal breath sounds bilaterally, and without pathologic use of accessory muscles. CARDIOVASCULAR: Heart is regular without murmurs, gallops, or rubs. GI: The abdomen is soft,  Mildly tenderLLQ, and nondistended. There are no palpable masses. There is no hepatosplenomegaly. There are normal bowel sounds in all quadrants. No peritonitis GU: Rectal deferred.   MUSCULOSKELETAL: Normal muscle strength and tone. No cyanosis or edema.   SKIN: Turgor is good and there are no pathologic skin lesions or ulcers. NEUROLOGIC: Motor and sensation is grossly normal. Cranial nerves are grossly intact. PSYCH:  Oriented to person, place and time. Affect is normal.  Data Reviewed  I have personally reviewed the patient's imaging, laboratory findings and medical records.    Assessment/  Plan Uncomplicated diverticulitis unfortunately with persistent symptoms. Likely this is related to his low tolerance for pain. And certainly patient is nontoxic and does not need any surgical intervention. Recommend admission by the hospitalist with IV antibiotics and a short course of nothing by mouth status. And no need for repeat any further images unless his clinical condition deteriorates. We will happy to follow along. I had a conversation with him regarding the indication for elective colectomy and I do  not think he falls in this category yet. I did advise him make sure he gets his colonoscopy done in 6 weeks after the acute attack. Extensive counseling provided Caroleen Hamman, MD FACS General Surgeon 02/26/2016, 6:19 PM

## 2016-02-27 LAB — BASIC METABOLIC PANEL
ANION GAP: 6 (ref 5–15)
BUN: 8 mg/dL (ref 6–20)
CHLORIDE: 98 mmol/L — AB (ref 101–111)
CO2: 29 mmol/L (ref 22–32)
CREATININE: 1.05 mg/dL (ref 0.61–1.24)
Calcium: 8.6 mg/dL — ABNORMAL LOW (ref 8.9–10.3)
GFR calc non Af Amer: >60 mL/min (ref >60)
Glucose, Bld: 128 mg/dL — ABNORMAL HIGH (ref 65–99)
Potassium: 3.8 mmol/L (ref 3.5–5.1)
SODIUM: 133 mmol/L — AB (ref 135–145)

## 2016-02-27 LAB — CBC
HCT: 49.8 % (ref 40.0–52.0)
HEMOGLOBIN: 17.4 g/dL (ref 13.0–18.0)
MCH: 34 pg (ref 26.0–34.0)
MCHC: 34.9 g/dL (ref 32.0–36.0)
MCV: 97.2 fL (ref 80.0–100.0)
PLATELETS: 216 10*3/uL (ref 150–440)
RBC: 5.12 MIL/uL (ref 4.40–5.90)
RDW: 13.5 % (ref 11.5–14.5)
WBC: 10.6 10*3/uL (ref 3.8–10.6)

## 2016-02-27 LAB — URINE CULTURE: CULTURE: NO GROWTH

## 2016-02-27 NOTE — Progress Notes (Signed)
CC: Diverticulitis Subjective: Feeling much better, pain subsided. No nausea and vomiting. Patient is hungry  Objective: Vital signs in last 24 hours: Temp:  [97.8 F (36.6 C)-98.5 F (36.9 C)] 98.5 F (36.9 C) (04/09 0754) Pulse Rate:  [63-82] 63 (04/09 0754) Resp:  [14-22] 14 (04/09 0754) BP: (160-197)/(76-117) 163/98 mmHg (04/09 0754) SpO2:  [93 %-98 %] 94 % (04/09 0754) Weight:  [113.989 kg (251 lb 4.8 oz)-115.667 kg (255 lb)] 113.989 kg (251 lb 4.8 oz) (04/08 1958) Last BM Date: 02/26/16  Intake/Output from previous day: 04/08 0701 - 04/09 0700 In: 1316.7 [I.V.:1016.7; IV Piggyback:300] Out: -  Intake/Output this shift:    Physical exam: NAD, ambulating Abd: soft, NT, no peritonitis Ext: well perfused, warm  Lab Results: CBC   Recent Labs  02/26/16 1653 02/27/16 0355  WBC 9.3 10.6  HGB 18.3* 17.4  HCT 50.9 49.8  PLT 204 216   BMET  Recent Labs  02/26/16 1653 02/27/16 0355  NA 134* 133*  K 3.4* 3.8  CL 100* 98*  CO2 28 29  GLUCOSE 123* 128*  BUN 5* 8  CREATININE 1.01 1.05  CALCIUM 9.2 8.6*   PT/INR No results for input(s): LABPROT, INR in the last 72 hours. ABG No results for input(s): PHART, HCO3 in the last 72 hours.  Invalid input(s): PCO2, PO2  Studies/Results: Ct Abdomen Pelvis Wo Contrast  02/25/2016  CLINICAL DATA:  Right lower quadrant abdominal pain and right flank pain. EXAM: CT ABDOMEN AND PELVIS WITHOUT CONTRAST TECHNIQUE: Multidetector CT imaging of the abdomen and pelvis was performed following the standard protocol without IV contrast. COMPARISON:  04/30/2015 FINDINGS: Lower chest:  Visualized lung bases are unremarkable. Hepatobiliary: Unenhanced appearance of the liver shows probable steatosis. The gallbladder appears unremarkable. Pancreas: Normal unenhanced appearance of the pancreas. Spleen: Normal unenhanced appearance of the spleen. Adrenals/Urinary Tract: The kidneys have a normal appearance bilaterally and show no evidence of  hydronephrosis. No urinary tract calculi are identified. The bladder is decompressed and unremarkable in appearance. Stomach/Bowel: Segmental inflammation of the colon noted in the proximal descending colon just below the spleen. Diverticular disease present and this most likely represents acute diverticulitis. Surrounding inflammation seen in the pericolic fat with a minimal amount of pericolic fluid. No focal abscess or free intraperitoneal air identified. No evidence of bowel obstruction. Vascular/Lymphatic: No lymphadenopathy. Other: No hernias identified. Musculoskeletal: Bony structures are unremarkable. IMPRESSION: Acute diverticulitis involving the proximal descending colon. No evidence of focal abscess or free intraperitoneal air. Electronically Signed   By: Aletta Edouard M.D.   On: 02/25/2016 17:02    Anti-infectives: Anti-infectives    Start     Dose/Rate Route Frequency Ordered Stop   02/27/16 0500  metroNIDAZOLE (FLAGYL) IVPB 500 mg     500 mg 100 mL/hr over 60 Minutes Intravenous Every 8 hours 02/26/16 1957     02/26/16 2130  ciprofloxacin (CIPRO) IVPB 400 mg     400 mg 200 mL/hr over 60 Minutes Intravenous Every 12 hours 02/26/16 2036     02/26/16 1730  ciprofloxacin (CIPRO) IVPB 400 mg  Status:  Discontinued     400 mg 200 mL/hr over 60 Minutes Intravenous  Once 02/26/16 1728 02/26/16 2035   02/26/16 1730  metroNIDAZOLE (FLAGYL) IVPB 500 mg     500 mg 100 mL/hr over 60 Minutes Intravenous  Once 02/26/16 1728 02/26/16 1904      Assessment/Plan: Advance diet Continue outpatient antibiotics No need for surgical intervention D/w with the patient in detail  Caroleen Hamman, MD, [facility]  02/27/2016

## 2016-02-27 NOTE — Discharge Instructions (Signed)

## 2016-02-27 NOTE — Progress Notes (Signed)
Pt discharged home. Not taking prednsione. Left ambulatory. Dr sudini notified of pt not taking prednsione at time of discharge. No new meds

## 2016-02-29 NOTE — Discharge Summary (Signed)
[facility] [place] NAME: Caleb [name]    MR#:  [id number]  DATE OF BIRTH:  05-18-1976  DATE OF ADMISSION:  02/26/2016 ADMITTING PHYSICIAN: Henreitta Leber, MD  DATE OF DISCHARGE: 02/27/2016 12:05 PM  PRIMARY CARE PHYSICIAN: Venida Jarvis, MD   ADMISSION DIAGNOSIS:  Diverticulitis of large intestine without perforation or abscess without bleeding [K57.32]  DISCHARGE DIAGNOSIS:  Active Problems:   Diverticulitis   SECONDARY DIAGNOSIS:   Past Medical History  Diagnosis Date  . Borderline diabetes   . Hypertension   . Hypercholesteremia   . GERD (gastroesophageal reflux disease)   . Knee pain, bilateral   . PTSD (post-traumatic stress disorder)   . History of degenerative disc disease   . Anxiety      ADMITTING HISTORY  Caleb [name] is a 40 y.o. male with a known history of Borderline diabetes, hypertension, hyperlipidemia, chronic knee pain, PTSD, GERD who presents to the hospital due to abdominal pain nausea vomiting ongoing for the past 2-4 days. Patient presented to the emergency room yesterday with similar symptoms underwent a CT scan of the abdomen and pelvis which showed acute diverticulitis. Patient was discharged on some oral pain meds and ciprofloxacin and Flagyl yesterday but his abdominal pain has gotten worse and therefore he returned to the ER for further evaluation. Patient denies any fever, chills, chest pain, shortness of breath or any other associated symptoms presently. Patient has failed oral outpatient therapy for acute diverticulitis and therefore hospitalist services were contacted further treatment and evaluation.  HOSPITAL COURSE:   40 year old male with past medical history of hypertension, chronic knee pain, PTSD, borderline diabetes, degenerative disc disease, neuropathy presents to the hospital due to abdominal pain nausea vomiting and noted to have acute diverticulitis.  #1 acute  diverticulitis #2 chronic pain #3 GERD-continue Protonix. #4 essential hypertension #5 anxiety #6 hyperlipidemia  Patient was admitted onto medical floor for acute diverticulitis. Started on IV antibiotics and IV fluids. Initially he was kept nothing by mouth later started on regular diet. By the day of discharge patient's abdominal pain has improved significantly. No nausea or vomiting. Afebrile. Seen by surgery in the hospital and no surgeries plan. His other comorbidities. The same in his home medications were continued. Stable for discharge home to follow-up with his primary care physician in one week  CONSULTS OBTAINED:     DRUG ALLERGIES:   Allergies  Allergen Reactions  . Bee Pollen Anaphylaxis  . Contrast Media [Iodinated Diagnostic Agents] Anaphylaxis  . Metformin And Related Anaphylaxis    Unknown     DISCHARGE MEDICATIONS:   Discharge Medication List as of 02/27/2016 11:26 AM    CONTINUE these medications which have NOT CHANGED   Details  atorvastatin (LIPITOR) 80 MG tablet Take 1 tablet by mouth at bedtime. , Starting 03/04/2015, Until Discontinued, Historical Med    ciprofloxacin (CIPRO) 500 MG tablet Take 1 tablet (500 mg total) by mouth 2 (two) times daily., Starting 02/25/2016, Until Discontinued, Print    diazepam (VALIUM) 5 MG tablet Take 10 mg by mouth at bedtime. , Starting 01/14/2015, Until Discontinued, Historical Med    diclofenac sodium (VOLTAREN) 1 % GEL Apply 2 g topically 4 (four) times daily., Until Discontinued, Historical Med    diphenhydrAMINE (BENADRYL) 25 mg capsule Take 50 mg by mouth every 6 (six) hours as needed for itching or allergies., Until Discontinued, Historical Med    gabapentin (NEURONTIN) 100 MG capsule Take 300 mg by mouth at  bedtime., Starting 08/24/2015, Until Discontinued, Historical Med    losartan-hydrochlorothiazide (HYZAAR) 100-25 MG per tablet Take 1 tablet by mouth daily., Starting 02/25/2015, Until Discontinued, Historical Med     metroNIDAZOLE (FLAGYL) 500 MG tablet Take 1 tablet (500 mg total) by mouth 2 (two) times daily., Starting 02/25/2016, Until Discontinued, Print    oxyCODONE (ROXICODONE) 15 MG immediate release tablet Take 15 mg by mouth 5 (five) times daily. For pain., Starting 09/25/2015, Until Discontinued, Historical Med    pantoprazole (PROTONIX) 40 MG tablet Take 1 tablet by mouth daily., Starting 12/15/2014, Until Discontinued, Historical Med    methocarbamol (ROBAXIN-750) 750 MG tablet Take 1 tablet (750 mg total) by mouth every 8 (eight) hours as needed for muscle spasms., Starting 10/18/2015, Until Discontinued, Print    ondansetron (ZOFRAN) 4 MG tablet Take 1 tablet (4 mg total) by mouth daily as needed for nausea or vomiting., Starting 11/11/2015, Until Discontinued, Print    predniSONE (STERAPRED UNI-PAK 21 TAB) 10 MG (21) TBPK tablet Take 6 tablets on day 1 Take 5 tablets on day 2 Take 4 tablets on day 3 Take 3 tablets on day 4 Take 2 tablets on day 5 Take 1 tablet on day 6, Print        Today   VITAL SIGNS:  Blood pressure 163/98, pulse 63, temperature 98.5 F (36.9 C), temperature source Oral, resp. rate 14, height 6' (1.829 m), weight 113.989 kg (251 lb 4.8 oz), SpO2 94 %.  I/O:  No intake or output data in the 24 hours ending 02/29/16 1606  PHYSICAL EXAMINATION:  Physical Exam  GENERAL:  40 y.o.-year-old patient lying in the bed with no acute distress.  LUNGS: Normal breath sounds bilaterally, no wheezing, rales,rhonchi or crepitation. No use of accessory muscles of respiration.  CARDIOVASCULAR: S1, S2 normal. No murmurs, rubs, or gallops.  ABDOMEN: Soft, non-tender, non-distended. Bowel sounds present. No organomegaly or mass.  NEUROLOGIC: Moves all 4 extremities. PSYCHIATRIC: The patient is alert and oriented x 3.  SKIN: No obvious rash, lesion, or ulcer.   DATA REVIEW:   CBC  Recent Labs Lab 02/27/16 0355  WBC 10.6  HGB 17.4  HCT 49.8  PLT 216    Chemistries    Recent Labs Lab 02/26/16 1653 02/27/16 0355  NA 134* 133*  K 3.4* 3.8  CL 100* 98*  CO2 28 29  GLUCOSE 123* 128*  BUN 5* 8  CREATININE 1.01 1.05  CALCIUM 9.2 8.6*  AST 37  --   ALT 59  --   ALKPHOS 100  --   BILITOT 0.5  --     Cardiac Enzymes No results for input(s): TROPONINI in the last 168 hours.  Microbiology Results  Results for orders placed or performed during the hospital encounter of 02/25/16  Urine culture     Status: None   Collection Time: 02/25/16 12:09 PM  Result Value Ref Range Status   Specimen Description URINE, RANDOM  Final   Special Requests NONE  Final   Culture NO GROWTH 2 DAYS  Final   Report Status 02/27/2016 FINAL  Final    RADIOLOGY:  No results found.  Follow up with PCP in 1 week.  Management plans discussed with the patient, family and they are in agreement.  CODE STATUS:  Code Status History    Date Active Date Inactive Code Status Order ID Comments User Context   02/26/2016  7:57 PM 02/27/2016  3:05 PM Full Code [id number]  Henreitta Leber, MD Inpatient  TOTAL TIME TAKING CARE OF THIS PATIENT ON DAY OF DISCHARGE: more than 30 minutes.   Hillary Bow R M.D on 02/29/2016 at 4:06 PM  Between 7am to 6pm - Pager - [phone]  After 6pm go to [email or web address] - password [email or web address] [facility]  Office  [phone]  CC: Primary care physician; Venida Jarvis, MD  Note: This dictation was prepared with Dragon dictation along with smaller phrase technology. Any transcriptional errors that result from this process are unintentional.

## 2016-03-07 DIAGNOSIS — Z8719 Personal history of other diseases of the digestive system: Secondary | ICD-10-CM | POA: Insufficient documentation

## 2016-06-12 DIAGNOSIS — N200 Calculus of kidney: Secondary | ICD-10-CM | POA: Insufficient documentation

## 2016-06-13 DIAGNOSIS — G8929 Other chronic pain: Secondary | ICD-10-CM | POA: Insufficient documentation

## 2016-06-13 DIAGNOSIS — Z91030 Bee allergy status: Secondary | ICD-10-CM | POA: Insufficient documentation

## 2016-07-18 ENCOUNTER — Emergency Department
Admission: EM | Admit: 2016-07-18 | Discharge: 2016-07-18 | Disposition: A | Discharge status: Home / Self Care | Payer: Medicare HMO | Attending: Emergency Medicine | Admitting: Emergency Medicine

## 2016-07-18 ENCOUNTER — Encounter: Payer: Self-pay | Admitting: Emergency Medicine

## 2016-07-18 DIAGNOSIS — I10 Essential (primary) hypertension: Secondary | ICD-10-CM | POA: Diagnosis not present

## 2016-07-18 DIAGNOSIS — Z79899 Other long term (current) drug therapy: Secondary | ICD-10-CM | POA: Diagnosis not present

## 2016-07-18 DIAGNOSIS — F17210 Nicotine dependence, cigarettes, uncomplicated: Secondary | ICD-10-CM | POA: Diagnosis not present

## 2016-07-18 DIAGNOSIS — K029 Dental caries, unspecified: Secondary | ICD-10-CM

## 2016-07-18 DIAGNOSIS — Z792 Long term (current) use of antibiotics: Secondary | ICD-10-CM | POA: Insufficient documentation

## 2016-07-18 DIAGNOSIS — K047 Periapical abscess without sinus: Secondary | ICD-10-CM | POA: Diagnosis not present

## 2016-07-18 DIAGNOSIS — Z7952 Long term (current) use of systemic steroids: Secondary | ICD-10-CM | POA: Diagnosis not present

## 2016-07-18 DIAGNOSIS — K0889 Other specified disorders of teeth and supporting structures: Secondary | ICD-10-CM | POA: Diagnosis present

## 2016-07-18 MED ORDER — PENICILLIN V POTASSIUM 500 MG PO TABS
500.0000 mg | ORAL_TABLET | Freq: Once | ORAL | Status: AC
  Administered 2016-07-18: 500 mg via ORAL
  Filled 2016-07-18: qty 1

## 2016-07-18 MED ORDER — TRAMADOL HCL 50 MG PO TABS: 50.0000 mg | ORAL_TABLET | Freq: Two times a day (BID) | ORAL | 0 refills | Status: DC

## 2016-07-18 MED ORDER — CLOTRIMAZOLE 10 MG MT TROC: 10.0000 mg | Freq: Every day | OROMUCOSAL | 0 refills | Status: DC

## 2016-07-18 MED ORDER — PENICILLIN V POTASSIUM 500 MG PO TABS: 500.0000 mg | ORAL_TABLET | Freq: Four times a day (QID) | ORAL | 0 refills | Status: DC

## 2016-07-18 NOTE — ED Provider Notes (Signed)
[facility] Emergency Department Provider Note ____________________________________________  Time seen: 2022  I have reviewed the triage vital signs and the nursing notes.  HISTORY  Chief Complaint  Dental Pain  HPI Caleb [name] is a 40 y.o. male presents to the ED with a one-week complaint of right lower jaw pain and a 3 day complaint of development of an abscess to the lower jaw. The patient reports a chronically broken first molar to the lower jaw. He reports pain, and swelling to the area. He denies any interim fevers, chills, or sweats. He also denies any difficulty breathing, swallowing, or controlling secretions. He presents to the ED for further evaluation and management.  Past Medical History:  Diagnosis Date  . Anxiety   . Borderline diabetes   . GERD (gastroesophageal reflux disease)   . History of degenerative disc disease   . Hypercholesteremia   . Hypertension   . Knee pain, bilateral   . PTSD (post-traumatic stress disorder)     Patient Active Problem List   Diagnosis Date Noted  . Diverticulitis 02/26/2016  . Diverticulitis of large intestine without perforation or abscess without bleeding     Past Surgical History:  Procedure Laterality Date  . Knee surgeries      Prior to Admission medications   Medication Sig Start Date End Date Taking? Authorizing Provider  atorvastatin (LIPITOR) 80 MG tablet Take 1 tablet by mouth at bedtime.  03/04/15   Historical Provider, MD  ciprofloxacin (CIPRO) 500 MG tablet Take 1 tablet (500 mg total) by mouth 2 (two) times daily. 02/25/16   Lisa Roca, MD  clotrimazole (MYCELEX) 10 MG troche Take 1 tablet (10 mg total) by mouth 5 (five) times daily. 07/18/16   Tawnie Ehresman V Bacon Caddie Randle, PA-C  diazepam (VALIUM) 5 MG tablet Take 10 mg by mouth at bedtime.  01/14/15   Historical Provider, MD  diclofenac sodium (VOLTAREN) 1 % GEL Apply 2 g topically 4 (four) times daily.    Historical Provider, MD   diphenhydrAMINE (BENADRYL) 25 mg capsule Take 50 mg by mouth every 6 (six) hours as needed for itching or allergies.    Historical Provider, MD  gabapentin (NEURONTIN) 100 MG capsule Take 300 mg by mouth at bedtime. 08/24/15   Historical Provider, MD  losartan-hydrochlorothiazide (HYZAAR) 100-25 MG per tablet Take 1 tablet by mouth daily. 02/25/15   Historical Provider, MD  methocarbamol (ROBAXIN-750) 750 MG tablet Take 1 tablet (750 mg total) by mouth every 8 (eight) hours as needed for muscle spasms. 10/18/15   Victorino Dike, FNP  metroNIDAZOLE (FLAGYL) 500 MG tablet Take 1 tablet (500 mg total) by mouth 2 (two) times daily. 02/25/16   Lisa Roca, MD  ondansetron (ZOFRAN) 4 MG tablet Take 1 tablet (4 mg total) by mouth daily as needed for nausea or vomiting. 11/11/15   Orbie Pyo, MD  oxyCODONE (ROXICODONE) 15 MG immediate release tablet Take 15 mg by mouth 5 (five) times daily. For pain. 09/25/15   Historical Provider, MD  pantoprazole (PROTONIX) 40 MG tablet Take 1 tablet by mouth daily. 12/15/14   Historical Provider, MD  penicillin v potassium (VEETID) 500 MG tablet Take 1 tablet (500 mg total) by mouth 4 (four) times daily. 07/18/16   Castle Lamons V Bacon Angel Hobdy, PA-C  predniSONE (STERAPRED UNI-PAK 21 TAB) 10 MG (21) TBPK tablet Take 6 tablets on day 1 Take 5 tablets on day 2 Take 4 tablets on day 3 Take 3 tablets on day 4 Take 2  tablets on day 5 Take 1 tablet on day 6 10/18/15   Victorino Dike, FNP  traMADol (ULTRAM) 50 MG tablet Take 1 tablet (50 mg total) by mouth 2 (two) times daily. 07/18/16   Skyley Grandmaison V Bacon Torryn Hudspeth, PA-C    Allergies Bee pollen; Contrast media [iodinated diagnostic agents]; and Metformin and related  Family History  Problem Relation Age of Onset  . Diabetes Mother   . Cancer Paternal Grandmother   . Cancer Paternal Grandfather     Social History Social History  Substance Use Topics  . Smoking status: Current Every Day Smoker    Packs/day: 1.00     Years: 20.00    Types: Cigarettes  . Smokeless tobacco: Never Used  . Alcohol use 2.4 oz/week    4 Cans of beer per week     Comment: occas.     Review of Systems  Constitutional: Negative for fever. Eyes: Negative for visual changes. ENT: Negative for sore throat. Dental pain and swelling as above. Skin: Negative for rash. Neurological: Negative for headaches, focal weakness or numbness. ____________________________________________  PHYSICAL EXAM:  VITAL SIGNS: ED Triage Vitals  Enc Vitals Group     BP 07/18/16 1953 (!) 190/87     Pulse Rate 07/18/16 1953 82     Resp 07/18/16 1953 18     Temp 07/18/16 1953 98.8 F (37.1 C)     Temp Source 07/18/16 1953 Oral     SpO2 07/18/16 1953 97 %     Weight 07/18/16 1952 240 lb (108.9 kg)     Height 07/18/16 1952 6' (1.829 m)     Head Circumference --      Peak Flow --      Pain Score 07/18/16 1952 10     Pain Loc --      Pain Edu? --      Excl. in [place]? --     Constitutional: Alert and oriented. Well appearing and in no distress. Head: Normocephalic and atraumatic. Mouth/Throat: Mucous membranes are moist. Patient with a subtle lower jaw abscess on the right. No obvious pointing fluctuance is noted at the local mucosa. He does have a chronically broken first molar down to the gumline. No sublingual swelling or oropharyngeal compromise is appreciated.   Neck: Supple. No thyromegaly. Hematological/Lymphatic/Immunological: No cervical lymphadenopathy. Cardiovascular: Normal rate, regular rhythm.  Respiratory: Normal respiratory effort. No wheezes/rales/rhonchi. Skin:  Skin is warm, dry and intact. No rash noted. ____________________________________________  PROCEDURES  Pen VK 500 mg PO ____________________________________________  INITIAL IMPRESSION / ASSESSMENT AND PLAN / ED COURSE  Acute dental abscess secondary to dental caries and a chronically fractured right first molar. Patient is discharged with a prescription for  Pen VK, as well as some prescription for Chlortrimazole troches, as he requested for oral thrush secondary to antibiotic use. He will follow with the local dental provider for definitive treatment and management. Return precautions were reviewed.  ----------------------------------------- 8:41 PM on 07/18/2016 ----------------------------------------- Patient declined printed Ultram RX due to pain management contract. Prescription returned to RN and voided.   Clinical Course   ____________________________________________  FINAL CLINICAL IMPRESSION(S) / ED DIAGNOSES  Final diagnoses:  Dental caries  Dental abscess  Pain due to dental caries      [name], PA-C 07/18/16 [address] [place], PA-C 07/18/16 2042    Harvest Dark, MD 07/18/16 2329

## 2016-07-18 NOTE — ED Triage Notes (Addendum)
Patient ambulatory to triage with steady gait, without difficulty or distress noted; pt reports right sided dental pain with swelling several days; pt reports on pain management program and took percocet 15mg  PTA

## 2016-07-18 NOTE — Discharge Instructions (Signed)
Take the antibiotic as directed until all pills are gone. Take the pain medicine as needed. Dose OTC Tylenol and Motrin for additional pain relief. Follow-up with a local dental provider for definitive care. Return to the ED for severe fevers, swelling, or purulent drainage.

## 2016-09-14 ENCOUNTER — Encounter: Payer: Self-pay | Admitting: Emergency Medicine

## 2016-09-14 ENCOUNTER — Emergency Department
Admission: EM | Admit: 2016-09-14 | Discharge: 2016-09-14 | Disposition: A | Discharge status: Home / Self Care | Payer: Medicare HMO | Attending: Emergency Medicine | Admitting: Emergency Medicine

## 2016-09-14 ENCOUNTER — Emergency Department: Payer: Medicare HMO

## 2016-09-14 DIAGNOSIS — I10 Essential (primary) hypertension: Secondary | ICD-10-CM | POA: Diagnosis not present

## 2016-09-14 DIAGNOSIS — E869 Volume depletion, unspecified: Secondary | ICD-10-CM

## 2016-09-14 DIAGNOSIS — F17210 Nicotine dependence, cigarettes, uncomplicated: Secondary | ICD-10-CM | POA: Diagnosis not present

## 2016-09-14 DIAGNOSIS — Z79899 Other long term (current) drug therapy: Secondary | ICD-10-CM | POA: Diagnosis not present

## 2016-09-14 DIAGNOSIS — R51 Headache: Secondary | ICD-10-CM | POA: Diagnosis present

## 2016-09-14 LAB — BASIC METABOLIC PANEL
Anion gap: 12 (ref 5–15)
BUN: 8 mg/dL (ref 6–20)
CHLORIDE: 100 mmol/L — AB (ref 101–111)
CO2: 25 mmol/L (ref 22–32)
Calcium: 9.7 mg/dL (ref 8.9–10.3)
Creatinine, Ser: 0.83 mg/dL (ref 0.61–1.24)
GFR calc Af Amer: >60 mL/min (ref >60)
GFR calc non Af Amer: >60 mL/min (ref >60)
Glucose, Bld: 118 mg/dL — ABNORMAL HIGH (ref 65–99)
POTASSIUM: 3.5 mmol/L (ref 3.5–5.1)
SODIUM: 137 mmol/L (ref 135–145)

## 2016-09-14 LAB — CBC
HCT: 56.3 % — ABNORMAL HIGH (ref 40.0–52.0)
HEMOGLOBIN: 19.3 g/dL — AB (ref 13.0–18.0)
MCH: 32.4 pg (ref 26.0–34.0)
MCHC: 34.3 g/dL (ref 32.0–36.0)
MCV: 94.4 fL (ref 80.0–100.0)
Platelets: 258 10*3/uL (ref 150–440)
RBC: 5.96 MIL/uL — AB (ref 4.40–5.90)
RDW: 14.1 % (ref 11.5–14.5)
WBC: 12.3 10*3/uL — ABNORMAL HIGH (ref 3.8–10.6)

## 2016-09-14 NOTE — ED Notes (Signed)
AAOx3.  Skin warm and dry.  Ambulates with easy and steady gait.  Mae equally and strong.  NAD

## 2016-09-14 NOTE — ED Notes (Signed)
Pt. Verbalizes understanding of d/c instructions and follow-up. VS stable and pain controlled per pt.  Pt. In NAD at time of d/c and denies further concerns regarding this visit. Pt. Stable at the time of departure from the unit, departing unit by the safest and most appropriate manner per that pt condition and limitations. Pt advised to return to the ED at any time for emergent concerns, or for new/worsening symptoms.   

## 2016-09-14 NOTE — ED Triage Notes (Addendum)
Pt to ED c/o hypertension x2 days.  States having blurry spotting vision, headache, and tightness in neck.  Pt reports left knee trouble and fell several days ago hitting head, not seen after.  Pt takes BP medication, unknown what medication, at home and reports taking an extra pill today without decrease in BP.  Reports hx of stroke 2 years ago without deficits.  Pt A&Ox4, speaking in complete and coherent sentences, neurologically intact, chest rise even and unlabored.

## 2016-09-14 NOTE — ED Provider Notes (Signed)
[facility] Emergency Department Provider Note  ____________________________________________   First MD Initiated Contact with Patient 09/14/16 1553     (approximate)  I have reviewed the triage vital signs and the nursing notes.   HISTORY  Chief Complaint Hypertension    HPI Caleb [name] is a 40 y.o. male With a history that includes hypertension, obesity, and daily tobacco use wesents for evaluation of elevated blood pressure.  He states that he has been having mild gradual in onset and waxings such as blurry vision, headache, and "feeling out of it" for several days.  He has checked his blood pressure and noticed that it is higher than usualin spite of taking his blood pressure medications.  He has a follow-up appointment in a few days with his primary care doctorbut he thought he should get checked out since his numbers have been high.  He denies chest pain, shortness of breath, nausea, vomiting, abdominal pain, dysuria, and any changes in his urinary habits. Nothing makes it better nor worse.   Past Medical History:  Diagnosis Date  . Anxiety   . Borderline diabetes   . GERD (gastroesophageal reflux disease)   . History of degenerative disc disease   . Hypercholesteremia   . Hypertension   . Knee pain, bilateral   . PTSD (post-traumatic stress disorder)     Patient Active Problem List   Diagnosis Date Noted  . Diverticulitis 02/26/2016  . Diverticulitis of large intestine without perforation or abscess without bleeding     Past Surgical History:  Procedure Laterality Date  . Knee surgeries      Prior to Admission medications   Medication Sig Start Date End Date Taking? Authorizing Provider  atorvastatin (LIPITOR) 80 MG tablet Take 1 tablet by mouth at bedtime.  03/04/15   Historical Provider, MD  ciprofloxacin (CIPRO) 500 MG tablet Take 1 tablet (500 mg total) by mouth 2 (two) times daily. 02/25/16   Lisa Roca, MD  clotrimazole  (MYCELEX) 10 MG troche Take 1 tablet (10 mg total) by mouth 5 (five) times daily. 07/18/16   Jenise V Bacon Menshew, PA-C  diazepam (VALIUM) 5 MG tablet Take 10 mg by mouth at bedtime.  01/14/15   Historical Provider, MD  diclofenac sodium (VOLTAREN) 1 % GEL Apply 2 g topically 4 (four) times daily.    Historical Provider, MD  diphenhydrAMINE (BENADRYL) 25 mg capsule Take 50 mg by mouth every 6 (six) hours as needed for itching or allergies.    Historical Provider, MD  gabapentin (NEURONTIN) 100 MG capsule Take 300 mg by mouth at bedtime. 08/24/15   Historical Provider, MD  losartan-hydrochlorothiazide (HYZAAR) 100-25 MG per tablet Take 1 tablet by mouth daily. 02/25/15   Historical Provider, MD  methocarbamol (ROBAXIN-750) 750 MG tablet Take 1 tablet (750 mg total) by mouth every 8 (eight) hours as needed for muscle spasms. 10/18/15   Victorino Dike, FNP  metroNIDAZOLE (FLAGYL) 500 MG tablet Take 1 tablet (500 mg total) by mouth 2 (two) times daily. 02/25/16   Lisa Roca, MD  ondansetron (ZOFRAN) 4 MG tablet Take 1 tablet (4 mg total) by mouth daily as needed for nausea or vomiting. 11/11/15   Orbie Pyo, MD  oxyCODONE (ROXICODONE) 15 MG immediate release tablet Take 15 mg by mouth 5 (five) times daily. For pain. 09/25/15   Historical Provider, MD  pantoprazole (PROTONIX) 40 MG tablet Take 1 tablet by mouth daily. 12/15/14   Historical Provider, MD  penicillin v potassium (  VEETID) 500 MG tablet Take 1 tablet (500 mg total) by mouth 4 (four) times daily. 07/18/16   Jenise V Bacon Menshew, PA-C  predniSONE (STERAPRED UNI-PAK 21 TAB) 10 MG (21) TBPK tablet Take 6 tablets on day 1 Take 5 tablets on day 2 Take 4 tablets on day 3 Take 3 tablets on day 4 Take 2 tablets on day 5 Take 1 tablet on day 6 10/18/15   Victorino Dike, FNP    Allergies Bee pollen; Contrast media [iodinated diagnostic agents]; and Metformin and related  Family History  Problem Relation Age of Onset  . Diabetes Mother    . Cancer Paternal Grandmother   . Cancer Paternal Grandfather     Social History Social History  Substance Use Topics  . Smoking status: Current Every Day Smoker    Packs/day: 1.00    Years: 20.00    Types: Cigarettes  . Smokeless tobacco: Never Used  . Alcohol use 2.4 oz/week    4 Cans of beer per week     Comment: occas.     Review of Systems Constitutional: No fever/chills Eyes: Occasional blurry vision ENT: No sore throat. Cardiovascular: Denies chest pain. Respiratory: Denies shortness of breath. Gastrointestinal: No abdominal pain.  No nausea, no vomiting.  No diarrhea.  No constipation. Genitourinary: Negative for dysuria. Musculoskeletal: Negative for back pain. Skin: Negative for rash. Neurological: Negative for headaches, focal weakness or numbness. Feels "out of it" at times  10-point ROS otherwise negative.  ____________________________________________   PHYSICAL EXAM:  VITAL SIGNS: ED Triage Vitals  Enc Vitals Group     BP 09/14/16 1401 (!) 177/129     Pulse Rate 09/14/16 1401 88     Resp 09/14/16 1401 18     Temp 09/14/16 1401 98.1 F (36.7 C)     Temp Source 09/14/16 1401 Oral     SpO2 09/14/16 1401 98 %     Weight 09/14/16 1402 250 lb (113.4 kg)     Height 09/14/16 1402 6' (1.829 m)     Head Circumference --      Peak Flow --      Pain Score 09/14/16 1402 9     Pain Loc --      Pain Edu? --      Excl. in [place]? --     Constitutional: Alert and oriented. Well appearing and in no acute distress. Eyes: Conjunctivae are normal. PERRL. EOMI.  No papilledema or other abnormalities visualized on funduscopic exam bilaterally Head: Atraumatic. Nose: No congestion/rhinnorhea. Mouth/Throat: Mucous membranes are moist.  Oropharynx non-erythematous. Neck: No stridor.  No meningeal signs.   Cardiovascular: Normal rate, regular rhythm. Good peripheral circulation. Grossly normal heart sounds. Respiratory: Normal respiratory effort.  No retractions.  Lungs CTAB. Gastrointestinal: Soft and nontender. No distention.  Musculoskeletal: No lower extremity tenderness nor edema. No gross deformities of extremities. Neurologic:  Normal speech and language. No gross focal neurologic deficits are appreciated.  Skin:  Skin is warm, dry and intact. No rash noted. Psychiatric: Mood and affect are normal. Speech and behavior are normal.  ____________________________________________   LABS (all labs ordered are listed, but only abnormal results are displayed)  Labs Reviewed  CBC - Abnormal; Notable for the following:       Result Value   WBC 12.3 (*)    RBC 5.96 (*)    Hemoglobin 19.3 (*)    HCT 56.3 (*)    All other components within normal limits  BASIC METABOLIC PANEL - Abnormal;  Notable for the following:    Chloride 100 (*)    Glucose, Bld 118 (*)    All other components within normal limits   ____________________________________________  EKG  None - EKG not ordered by ED physician ____________________________________________  RADIOLOGY   Ct Head Wo Contrast  Result Date: 09/14/2016 CLINICAL DATA:  Hypertension for 2 days with blurred vision, headaches and tightness in the neck. EXAM: CT HEAD WITHOUT CONTRAST TECHNIQUE: Contiguous axial images were obtained from the base of the skull through the vertex without intravenous contrast. COMPARISON:  October 02, 2015 FINDINGS: Brain: No evidence of acute infarction, hemorrhage, hydrocephalus, extra-axial collection or mass lesion/mass effect. Vascular: No hyperdense vessel or unexpected calcification. Skull: Normal. Negative for fracture or focal lesion. Sinuses/Orbits: Mucoperiosteal thickening of bilateral ethmoid sinuses are identified. Other: None. IMPRESSION: No acute intracranial abnormality identified. Mucoperiosteal thickening of bilateral ethmoid sinuses. Electronically Signed   By: Abelardo Diesel M.D.   On: 09/14/2016 14:41     ____________________________________________   PROCEDURES  Procedure(s) performed:   Procedures   Critical Care performed: No ____________________________________________   INITIAL IMPRESSION / ASSESSMENT AND PLAN / ED COURSE  Pertinent labs & imaging results that were available during my care of the patient were reviewed by me and considered in my medical decision making (see chart for details).  He patient had a reassuring workup. His head CT was normal and his labs are all essentially normal.  His CBC appears hemoconcentrated with a hemoglobin of 19.3 and a slight leukocytosis. I explained to them that this makes it appears if he is somewhat volume depleted or dehydrated in spite of his normal creatinine and I encouraged him to drink follows up with his regular doctor in a few days.  We had a long discussion about his blood pressure and the fact that this is likely a gradual onset chronic elevation.  I offered to start a new medicine such as amlodipine today but also explained that most of the time it is better to have such blood pressure medication changes performed by the PCP.  He would prefer to talk with his regular doctor about a neck suite.  I encouraged him to stay away from salts, drink plenty of clear fluids such as wa and low calorie Gatorade,continue taking all his regular medications, and follow up as scheduled.   I gave my usual and customary return precautions.        ____________________________________________  FINAL CLINICAL IMPRESSION(S) / ED DIAGNOSES  Final diagnoses:  Essential hypertension  Volume depletion     MEDICATIONS GIVEN DURING THIS VISIT:  Medications - No data to display   NEW OUTPATIENT MEDICATIONS STARTED DURING THIS VISIT:  New Prescriptions   No medications on file    Modified Medications   No medications on file    Discontinued Medications   No medications on file     Note:  This document was prepared using Dragon  voice recognition software and may include unintentional dictation errors.    Hinda Kehr, MD 09/14/16 [phone]

## 2016-09-14 NOTE — Discharge Instructions (Signed)
As we discussed, though you do have high blood pressure (hypertension), fortunately it is not immediately dangerous at this time and does not need emergency intervention or admission to the hospital.  If we add to or change your regular medications, we may cause more harm than good - it is more appropriate for your primary care doctor to evaluate you in clinic and decide if any medication changes are needed.  Please follow up in clinic as recommended in these papers.    Your blood count appeared concentrated today meaning that you look a little bit dehydrated.  Even though the rescue labs are reassuring we encourage you to drink plenty of clear fluids such as water or Gatorade (try to avoid sugary or high calorie drinks) before you follow up with your doctor next week.  Return to the Emergency Department (ED) if you experience any worsening chest pain/pressure/tightness, difficulty breathing, or sudden sweating, or other symptoms that concern you.

## 2017-01-24 ENCOUNTER — Encounter: Payer: Self-pay | Admitting: Emergency Medicine

## 2017-01-24 ENCOUNTER — Inpatient Hospital Stay
Admission: EM | Admit: 2017-01-24 | Discharge: 2017-01-25 | DRG: 311 | Disposition: A | Discharge status: Home / Self Care | Payer: Medicare Other | Attending: Internal Medicine | Admitting: Internal Medicine

## 2017-01-24 ENCOUNTER — Emergency Department: Payer: Medicare Other

## 2017-01-24 DIAGNOSIS — K219 Gastro-esophageal reflux disease without esophagitis: Secondary | ICD-10-CM | POA: Diagnosis present

## 2017-01-24 DIAGNOSIS — I200 Unstable angina: Secondary | ICD-10-CM | POA: Diagnosis present

## 2017-01-24 DIAGNOSIS — F419 Anxiety disorder, unspecified: Secondary | ICD-10-CM | POA: Diagnosis not present

## 2017-01-24 DIAGNOSIS — Z91041 Radiographic dye allergy status: Secondary | ICD-10-CM

## 2017-01-24 DIAGNOSIS — G8929 Other chronic pain: Secondary | ICD-10-CM | POA: Diagnosis not present

## 2017-01-24 DIAGNOSIS — Z833 Family history of diabetes mellitus: Secondary | ICD-10-CM | POA: Diagnosis not present

## 2017-01-24 DIAGNOSIS — M549 Dorsalgia, unspecified: Secondary | ICD-10-CM | POA: Diagnosis present

## 2017-01-24 DIAGNOSIS — I10 Essential (primary) hypertension: Secondary | ICD-10-CM | POA: Diagnosis not present

## 2017-01-24 DIAGNOSIS — I998 Other disorder of circulatory system: Secondary | ICD-10-CM | POA: Diagnosis not present

## 2017-01-24 DIAGNOSIS — K0889 Other specified disorders of teeth and supporting structures: Secondary | ICD-10-CM

## 2017-01-24 DIAGNOSIS — Z7952 Long term (current) use of systemic steroids: Secondary | ICD-10-CM | POA: Diagnosis not present

## 2017-01-24 DIAGNOSIS — K047 Periapical abscess without sinus: Secondary | ICD-10-CM | POA: Diagnosis present

## 2017-01-24 DIAGNOSIS — Z91030 Bee allergy status: Secondary | ICD-10-CM

## 2017-01-24 DIAGNOSIS — Z791 Long term (current) use of non-steroidal anti-inflammatories (NSAID): Secondary | ICD-10-CM

## 2017-01-24 DIAGNOSIS — R9431 Abnormal electrocardiogram [ECG] [EKG]: Secondary | ICD-10-CM | POA: Diagnosis present

## 2017-01-24 DIAGNOSIS — F17210 Nicotine dependence, cigarettes, uncomplicated: Secondary | ICD-10-CM | POA: Diagnosis not present

## 2017-01-24 DIAGNOSIS — E7800 Pure hypercholesterolemia, unspecified: Secondary | ICD-10-CM | POA: Diagnosis not present

## 2017-01-24 DIAGNOSIS — E119 Type 2 diabetes mellitus without complications: Secondary | ICD-10-CM | POA: Diagnosis present

## 2017-01-24 DIAGNOSIS — Z79891 Long term (current) use of opiate analgesic: Secondary | ICD-10-CM | POA: Diagnosis not present

## 2017-01-24 DIAGNOSIS — Z809 Family history of malignant neoplasm, unspecified: Secondary | ICD-10-CM

## 2017-01-24 DIAGNOSIS — F40240 Claustrophobia: Secondary | ICD-10-CM | POA: Diagnosis not present

## 2017-01-24 DIAGNOSIS — Z79899 Other long term (current) drug therapy: Secondary | ICD-10-CM | POA: Diagnosis not present

## 2017-01-24 DIAGNOSIS — Z888 Allergy status to other drugs, medicaments and biological substances status: Secondary | ICD-10-CM

## 2017-01-24 LAB — APTT: aPTT: 27 seconds (ref 24–36)

## 2017-01-24 LAB — BASIC METABOLIC PANEL
Anion gap: 11 (ref 5–15)
BUN: 7 mg/dL (ref 6–20)
CHLORIDE: 95 mmol/L — AB (ref 101–111)
CO2: 31 mmol/L (ref 22–32)
Calcium: 9.6 mg/dL (ref 8.9–10.3)
Creatinine, Ser: 0.94 mg/dL (ref 0.61–1.24)
GFR calc Af Amer: >60 mL/min (ref >60)
GLUCOSE: 162 mg/dL — AB (ref 65–99)
POTASSIUM: 3.8 mmol/L (ref 3.5–5.1)
SODIUM: 137 mmol/L (ref 135–145)

## 2017-01-24 LAB — HEPATIC FUNCTION PANEL
ALBUMIN: 4.6 g/dL (ref 3.5–5.0)
ALK PHOS: 93 U/L (ref 38–126)
ALT: 42 U/L (ref 17–63)
AST: 33 U/L (ref 15–41)
BILIRUBIN INDIRECT: 0.7 mg/dL (ref 0.3–0.9)
BILIRUBIN TOTAL: 0.8 mg/dL (ref 0.3–1.2)
Bilirubin, Direct: 0.1 mg/dL (ref 0.1–0.5)
Total Protein: 7.8 g/dL (ref 6.5–8.1)

## 2017-01-24 LAB — CBC
HEMATOCRIT: 55.9 % — AB (ref 40.0–52.0)
Hemoglobin: 19.9 g/dL — ABNORMAL HIGH (ref 13.0–18.0)
MCH: 34 pg (ref 26.0–34.0)
MCHC: 35.7 g/dL (ref 32.0–36.0)
MCV: 95.3 fL (ref 80.0–100.0)
PLATELETS: 216 10*3/uL (ref 150–440)
RBC: 5.86 MIL/uL (ref 4.40–5.90)
RDW: 14.6 % — AB (ref 11.5–14.5)
WBC: 12 10*3/uL — AB (ref 3.8–10.6)

## 2017-01-24 LAB — TROPONIN I: Troponin I: <0.03 ng/mL (ref <0.03)

## 2017-01-24 LAB — PROTIME-INR
INR: 0.9
PROTHROMBIN TIME: 12.1 s (ref 11.4–15.2)

## 2017-01-24 LAB — LIPASE, BLOOD: LIPASE: 14 U/L (ref 11–51)

## 2017-01-24 MED ORDER — GABAPENTIN 300 MG PO CAPS
300.0000 mg | ORAL_CAPSULE | Freq: Every day | ORAL | Status: DC
  Filled 2017-01-24: qty 1

## 2017-01-24 MED ORDER — KETOROLAC TROMETHAMINE 30 MG/ML IJ SOLN: 30.0000 mg | Freq: Four times a day (QID) | INTRAMUSCULAR | Status: DC | PRN

## 2017-01-24 MED ORDER — OXYCODONE HCL 5 MG PO TABS
15.0000 mg | ORAL_TABLET | Freq: Four times a day (QID) | ORAL | Status: DC | PRN
  Administered 2017-01-24 – 2017-01-25 (×2): 15 mg via ORAL
  Filled 2017-01-24: qty 3

## 2017-01-24 MED ORDER — SODIUM CHLORIDE 0.9% FLUSH
3.0000 mL | Freq: Two times a day (BID) | INTRAVENOUS | Status: DC
  Administered 2017-01-24: 3 mL via INTRAVENOUS

## 2017-01-24 MED ORDER — DIAZEPAM 5 MG PO TABS: 5.0000 mg | ORAL_TABLET | Freq: Every evening | ORAL | Status: DC | PRN

## 2017-01-24 MED ORDER — ACETAMINOPHEN 650 MG RE SUPP
650.0000 mg | Freq: Four times a day (QID) | RECTAL | Status: DC | PRN
  Filled 2017-01-24: qty 1

## 2017-01-24 MED ORDER — METHOCARBAMOL 500 MG PO TABS: 750.0000 mg | ORAL_TABLET | Freq: Three times a day (TID) | ORAL | Status: DC | PRN

## 2017-01-24 MED ORDER — ALBUTEROL SULFATE (2.5 MG/3ML) 0.083% IN NEBU: 2.5000 mg | INHALATION_SOLUTION | RESPIRATORY_TRACT | Status: DC | PRN

## 2017-01-24 MED ORDER — LOSARTAN POTASSIUM 50 MG PO TABS
100.0000 mg | ORAL_TABLET | Freq: Every day | ORAL | Status: DC
  Administered 2017-01-25: 100 mg via ORAL
  Filled 2017-01-24: qty 2

## 2017-01-24 MED ORDER — PANTOPRAZOLE SODIUM 40 MG PO TBEC
40.0000 mg | DELAYED_RELEASE_TABLET | Freq: Every day | ORAL | Status: DC
  Administered 2017-01-25: 40 mg via ORAL
  Filled 2017-01-24: qty 1

## 2017-01-24 MED ORDER — DIAZEPAM 5 MG PO TABS
10.0000 mg | ORAL_TABLET | Freq: Every day | ORAL | Status: DC
  Administered 2017-01-24: 10 mg via ORAL
  Filled 2017-01-24: qty 2

## 2017-01-24 MED ORDER — BISACODYL 10 MG RE SUPP: 10.0000 mg | Freq: Every day | RECTAL | Status: DC | PRN

## 2017-01-24 MED ORDER — MORPHINE SULFATE (PF) 4 MG/ML IV SOLN
INTRAVENOUS | Status: AC
  Administered 2017-01-24: 4 mg via INTRAVENOUS
  Filled 2017-01-24: qty 1

## 2017-01-24 MED ORDER — NITROGLYCERIN 0.4 MG SL SUBL: 0.4000 mg | SUBLINGUAL_TABLET | SUBLINGUAL | Status: DC | PRN

## 2017-01-24 MED ORDER — ASPIRIN 81 MG PO CHEW
243.0000 mg | CHEWABLE_TABLET | Freq: Once | ORAL | Status: AC
  Administered 2017-01-24: 243 mg via ORAL

## 2017-01-24 MED ORDER — PREGABALIN 75 MG PO CAPS
75.0000 mg | ORAL_CAPSULE | Freq: Three times a day (TID) | ORAL | Status: DC
  Administered 2017-01-24 – 2017-01-25 (×2): 75 mg via ORAL
  Filled 2017-01-24 (×2): qty 1

## 2017-01-24 MED ORDER — HEPARIN BOLUS VIA INFUSION
4000.0000 [IU] | Freq: Once | INTRAVENOUS | Status: AC
  Administered 2017-01-24: 4000 [IU] via INTRAVENOUS
  Filled 2017-01-24: qty 4000

## 2017-01-24 MED ORDER — ASPIRIN EC 81 MG PO TBEC
81.0000 mg | DELAYED_RELEASE_TABLET | Freq: Every day | ORAL | Status: DC
  Administered 2017-01-25: 81 mg via ORAL
  Filled 2017-01-24: qty 1

## 2017-01-24 MED ORDER — ONDANSETRON HCL 4 MG PO TABS: 4.0000 mg | ORAL_TABLET | Freq: Four times a day (QID) | ORAL | Status: DC | PRN

## 2017-01-24 MED ORDER — SODIUM CHLORIDE 0.9 % IV SOLN
INTRAVENOUS | Status: DC
  Administered 2017-01-25: 04:00:00 via INTRAVENOUS

## 2017-01-24 MED ORDER — ACETAMINOPHEN 325 MG PO TABS
650.0000 mg | ORAL_TABLET | Freq: Four times a day (QID) | ORAL | Status: DC | PRN
  Administered 2017-01-24: 650 mg via ORAL
  Filled 2017-01-24: qty 2

## 2017-01-24 MED ORDER — ONDANSETRON HCL 4 MG/2ML IJ SOLN: 4.0000 mg | Freq: Four times a day (QID) | INTRAMUSCULAR | Status: DC | PRN

## 2017-01-24 MED ORDER — LOSARTAN POTASSIUM-HCTZ 100-25 MG PO TABS: 1.0000 | ORAL_TABLET | Freq: Every day | ORAL | Status: DC

## 2017-01-24 MED ORDER — AMOXICILLIN-POT CLAVULANATE 875-125 MG PO TABS
1.0000 | ORAL_TABLET | Freq: Two times a day (BID) | ORAL | Status: DC
  Administered 2017-01-24 – 2017-01-25 (×2): 1 via ORAL
  Filled 2017-01-24 (×2): qty 1

## 2017-01-24 MED ORDER — ASPIRIN 81 MG PO CHEW
CHEWABLE_TABLET | ORAL | Status: AC
  Administered 2017-01-24: 243 mg via ORAL
  Filled 2017-01-24: qty 3

## 2017-01-24 MED ORDER — INSULIN ASPART 100 UNIT/ML ~~LOC~~ SOLN
0.0000 [IU] | Freq: Three times a day (TID) | SUBCUTANEOUS | Status: DC
  Administered 2017-01-25: 3 [IU] via SUBCUTANEOUS
  Administered 2017-01-25: 2 [IU] via SUBCUTANEOUS
  Filled 2017-01-24: qty 2
  Filled 2017-01-24: qty 3

## 2017-01-24 MED ORDER — HEPARIN (PORCINE) IN NACL 100-0.45 UNIT/ML-% IJ SOLN
1800.0000 [IU]/h | INTRAMUSCULAR | Status: DC
  Administered 2017-01-24: 1400 [IU]/h via INTRAVENOUS
  Administered 2017-01-25 (×2): 1800 [IU]/h via INTRAVENOUS
  Filled 2017-01-24 (×3): qty 250

## 2017-01-24 MED ORDER — OXYCODONE HCL 5 MG PO TABS
15.0000 mg | ORAL_TABLET | Freq: Every day | ORAL | Status: DC
  Filled 2017-01-24: qty 3

## 2017-01-24 MED ORDER — HYDRALAZINE HCL 20 MG/ML IJ SOLN
10.0000 mg | Freq: Four times a day (QID) | INTRAMUSCULAR | Status: DC | PRN
Start: 2017-01-24 — End: 2017-01-25

## 2017-01-24 MED ORDER — MORPHINE SULFATE (PF) 4 MG/ML IV SOLN
4.0000 mg | Freq: Once | INTRAVENOUS | Status: AC
  Administered 2017-01-24: 4 mg via INTRAVENOUS

## 2017-01-24 MED ORDER — POLYETHYLENE GLYCOL 3350 17 G PO PACK: 17.0000 g | PACK | Freq: Every day | ORAL | Status: DC | PRN

## 2017-01-24 MED ORDER — HYDROCHLOROTHIAZIDE 25 MG PO TABS
25.0000 mg | ORAL_TABLET | Freq: Every day | ORAL | Status: DC
  Administered 2017-01-25: 25 mg via ORAL
  Filled 2017-01-24: qty 1

## 2017-01-24 MED ORDER — BACLOFEN 10 MG PO TABS
10.0000 mg | ORAL_TABLET | Freq: Three times a day (TID) | ORAL | Status: DC | PRN
  Administered 2017-01-24: 10 mg via ORAL
  Filled 2017-01-24: qty 1

## 2017-01-24 MED ORDER — HEPARIN SODIUM (PORCINE) 5000 UNIT/ML IJ SOLN: 4000.0000 [IU] | Freq: Once | INTRAMUSCULAR | Status: DC

## 2017-01-24 MED ORDER — ATORVASTATIN CALCIUM 20 MG PO TABS
80.0000 mg | ORAL_TABLET | Freq: Every day | ORAL | Status: DC
  Administered 2017-01-24: 80 mg via ORAL
  Filled 2017-01-24: qty 4

## 2017-01-24 NOTE — ED Notes (Signed)
Per provider he developed substernal chest pain about 30 mins ago  Describes as squeezing  He had same pain last pm also per family his b/p was elevated this morning  So he took an extra b/p med

## 2017-01-24 NOTE — ED Notes (Signed)
Pt and family member at bedside requesting to speak with Dr. Karma Greaser.  Pt states that he is refusing to stay at this time.

## 2017-01-24 NOTE — ED Notes (Signed)
Pt c/o intermittent CP over last 2 days, hypertension and bilateral lower toothache. Pt reddened in face, nonproductive cough. Pt smells strongly of cigarettes.

## 2017-01-24 NOTE — ED Provider Notes (Signed)
[facility] Emergency Department Provider Note  ____________________________________________   First MD Initiated Contact with Patient 01/24/17 1811     (approximate)  I have reviewed the triage vital signs and the nursing notes.   HISTORY  Chief Complaint Dental Pain and Chest Pain    HPI Caleb [name] is a 41 y.o. male with extensive chronic medical issues who presents for evaluation of chest pain and dental pain.  His original CC was dental pain, but when he got into a room he reported acute onset squeezing severe chest pain  occuring about 30 minutes ago.  Reports this has been happening several times over the last few days including while at rest.  Nothing makes it better nor worse.  Describes it as a heaviness and a squeezing sensation in the center of his chest.  Mild nausea.  Chronic cough from heavy tobacco use.  No recent illnesses.  Chronic poorly controlled hypertension, but he reports it has been worse recently in spite of being compliant with his medication regimen.  Regarding his dental pain, he reports that it is severe and bilateral in his lower jaw.  Has been present for an extended period of time and states that it is because "all calcium has gone out of my teeth".  He has plans to have all of his teeth extracted and replaced.  He does not have one particular spot that is particularly painful or swollen.  He is not having trouble swallowing and does not have a sore throat.  There is some swelling on both sides but it is mild.  Past Medical History:  Diagnosis Date  . Anxiety   . Borderline diabetes   . GERD (gastroesophageal reflux disease)   . History of degenerative disc disease   . Hypercholesteremia   . Hypertension   . Knee pain, bilateral   . PTSD (post-traumatic stress disorder)     Patient Active Problem List   Diagnosis Date Noted  . Diverticulitis 02/26/2016  . Diverticulitis of large intestine without perforation  or abscess without bleeding     Past Surgical History:  Procedure Laterality Date  . Knee surgeries      Prior to Admission medications   Medication Sig Start Date End Date Taking? Authorizing Provider  atorvastatin (LIPITOR) 80 MG tablet Take 1 tablet by mouth at bedtime.  03/04/15   Historical Provider, MD  ciprofloxacin (CIPRO) 500 MG tablet Take 1 tablet (500 mg total) by mouth 2 (two) times daily. 02/25/16   Lisa Roca, MD  clotrimazole (MYCELEX) 10 MG troche Take 1 tablet (10 mg total) by mouth 5 (five) times daily. 07/18/16   Jenise V Bacon Menshew, PA-C  diazepam (VALIUM) 5 MG tablet Take 10 mg by mouth at bedtime.  01/14/15   Historical Provider, MD  diclofenac sodium (VOLTAREN) 1 % GEL Apply 2 g topically 4 (four) times daily.    Historical Provider, MD  diphenhydrAMINE (BENADRYL) 25 mg capsule Take 50 mg by mouth every 6 (six) hours as needed for itching or allergies.    Historical Provider, MD  gabapentin (NEURONTIN) 100 MG capsule Take 300 mg by mouth at bedtime. 08/24/15   Historical Provider, MD  losartan-hydrochlorothiazide (HYZAAR) 100-25 MG per tablet Take 1 tablet by mouth daily. 02/25/15   Historical Provider, MD  methocarbamol (ROBAXIN-750) 750 MG tablet Take 1 tablet (750 mg total) by mouth every 8 (eight) hours as needed for muscle spasms. 10/18/15   Victorino Dike, FNP  metroNIDAZOLE (FLAGYL) 500  MG tablet Take 1 tablet (500 mg total) by mouth 2 (two) times daily. 02/25/16   Lisa Roca, MD  ondansetron (ZOFRAN) 4 MG tablet Take 1 tablet (4 mg total) by mouth daily as needed for nausea or vomiting. 11/11/15   Orbie Pyo, MD  oxyCODONE (ROXICODONE) 15 MG immediate release tablet Take 15 mg by mouth 5 (five) times daily. For pain. 09/25/15   Historical Provider, MD  pantoprazole (PROTONIX) 40 MG tablet Take 1 tablet by mouth daily. 12/15/14   Historical Provider, MD  penicillin v potassium (VEETID) 500 MG tablet Take 1 tablet (500 mg total) by mouth 4 (four) times  daily. 07/18/16   Jenise V Bacon Menshew, PA-C  predniSONE (STERAPRED UNI-PAK 21 TAB) 10 MG (21) TBPK tablet Take 6 tablets on day 1 Take 5 tablets on day 2 Take 4 tablets on day 3 Take 3 tablets on day 4 Take 2 tablets on day 5 Take 1 tablet on day 6 10/18/15   Victorino Dike, FNP    Allergies Bee pollen; Contrast media [iodinated diagnostic agents]; Metformin and related; and Lisinopril  Family History  Problem Relation Age of Onset  . Diabetes Mother   . Cancer Paternal Grandmother   . Cancer Paternal Grandfather     Social History Social History  Substance Use Topics  . Smoking status: Current Every Day Smoker    Packs/day: 1.00    Years: 20.00    Types: Cigarettes  . Smokeless tobacco: Never Used  . Alcohol use 2.4 oz/week    4 Cans of beer per week     Comment: occas.     Review of Systems Constitutional: No fever/chills Eyes: No visual changes. ENT: No sore throat. Cardiovascular: Episodic chest pain while at rest over the last week. Respiratory: Denies shortness of breath. Gastrointestinal: No abdominal pain.  No nausea, no vomiting.  No diarrhea.  No constipation. Genitourinary: Negative for dysuria. Musculoskeletal: Negative for back pain. Skin: Negative for rash. Neurological: Negative for headaches, focal weakness or numbness.  10-point ROS otherwise negative.  ____________________________________________   PHYSICAL EXAM:  VITAL SIGNS: ED Triage Vitals  Enc Vitals Group     BP 01/24/17 1714 (!) 175/100     Pulse Rate 01/24/17 1714 86     Resp 01/24/17 1714 16     Temp 01/24/17 1714 98 F (36.7 C)     Temp Source 01/24/17 1714 Oral     SpO2 01/24/17 1714 95 %     Weight 01/24/17 1715 245 lb (111.1 kg)     Height 01/24/17 1715 6' (1.829 m)     Head Circumference --      Peak Flow --      Pain Score 01/24/17 1713 8     Pain Loc --      Pain Edu? --      Excl. in [place]? --     Constitutional: Alert and oriented. Well appearing and in no  acute distress. Heavy smell of cigarette smoke. Eyes: Conjunctivae are normal. PERRL. EOMI. Head: Atraumatic. Nose: No congestion/rhinnorhea. Mouth/Throat: Mucous membranes are moist. Very poor dentition.  Mild tenderness to palpation at various spots of his lower jaw but there is no focal evidence of abscess or infection.  No evidence of Ludwig's angina. Neck: No stridor.  No meningeal signs.   Cardiovascular: Normal rate, regular rhythm. Good peripheral circulation. Grossly normal heart sounds.  No reproducible chest wall tenderness Respiratory: Normal respiratory effort.  No retractions. Lungs CTAB.  He has  some noisy breathing but it seems to be coming from his upper airway and not his lungs as he has no wheezing upon auscultation Gastrointestinal: Soft and nontender. No distention.  Musculoskeletal: No lower extremity tenderness nor edema. No gross deformities of extremities. Neurologic:  Normal speech and language. No gross focal neurologic deficits are appreciated.  Skin:  Skin is warm, dry and intact. No rash noted. Psychiatric: Mood and affect are normal. Speech and behavior are normal.  ____________________________________________   LABS (all labs ordered are listed, but only abnormal results are displayed)  Labs Reviewed  BASIC METABOLIC PANEL - Abnormal; Notable for the following:       Result Value   Chloride 95 (*)    Glucose, Bld 162 (*)    All other components within normal limits  CBC - Abnormal; Notable for the following:    WBC 12.0 (*)    Hemoglobin 19.9 (*)    HCT 55.9 (*)    RDW 14.6 (*)    All other components within normal limits  TROPONIN I  PROTIME-INR  HEPATIC FUNCTION PANEL  LIPASE, BLOOD   ____________________________________________  EKG  ED ECG REPORT I, Ikran Patman, the attending physician, personally viewed and interpreted this ECG.  Date: 01/24/2017 EKG Time: 18:05 Rate: 76 Rhythm: normal sinus rhythm QRS Axis: normal Intervals:  normal ST/T Wave abnormalities: Approximately 1 mm of ST depression in leads V4, V5, and V6.  The T waves have a slightly abnormal appearance in the septal leads which may represent reciprocal changes but it is difficult to appreciate.  He also has very slight ST depression in leads 2 and aVF. Conduction Disturbances: none Narrative Interpretation: [place] both for acute ischemia but does not meet STEMI criteria  ____________________________________________  RADIOLOGY   Dg Chest 2 View  Result Date: 01/24/2017 CLINICAL DATA:  Intermittent chest pain x2 days EXAM: CHEST  2 VIEW COMPARISON:  12/06/2015 FINDINGS: The heart size and mediastinal contours are within normal limits. Both lungs are clear. The visualized skeletal structures are unremarkable. IMPRESSION: No active cardiopulmonary disease. Electronically Signed   By: Ashley Royalty M.D.   On: 01/24/2017 18:35    ____________________________________________   PROCEDURES  Procedure(s) performed:   Procedures   Critical Care performed: No ____________________________________________   INITIAL IMPRESSION / ASSESSMENT AND PLAN / ED COURSE  Pertinent labs & imaging results that were available during my care of the patient were reviewed by me and considered in my medical decision making (see chart for details).  Compared to prior EKGs, he had some nonspecific ST-T segment changes before but there is some well-defined ST segment depression in this EKG.  His blood pressure significantly elevated which may be continued to work could be unrelated to his episodic chest pain.  However he and his wife are very adamant that his episodic chest pain even while at rest is new over the last week.  He thinks that he had  A cardiac catheterization within the last several years but I see no results of any cardiovascular procedures or testing in the EMR.  I anticipate he will need admission because he meets clinical criteria for unstable angina and has  some concerning EKG changes.  I will await his troponin and I am giving him the remainder of the full dose aspirin (he had a 81 mg aspirin earlier today) but I anticipate starting him on heparin and admitting to him to the hospitalist Clinical Course as of Jan 25 1912  Wed Jan 24, 2017  1842 Normal CXR DG Chest 2 View [CF]  1859 High risk episodic chest pain at rest, many risk factors.  will admit for unstable angina and start heparin.  [CF]  1912 Discussed with Dr. Darvin Neighbours who agrees with the plan.  [CF]  1912 Will not intervene on HTN at this time because the patient's pressures are consistent with priors.  Will defer to Dr. Darvin Neighbours.  [CF]    Clinical Course User Index [CF] Hinda Kehr, MD    ____________________________________________  FINAL CLINICAL IMPRESSION(S) / ED DIAGNOSES  Final diagnoses:  Pain, dental  Unstable angina ([place])  Uncontrolled hypertension     MEDICATIONS GIVEN DURING THIS VISIT:  Medications  heparin injection 4,000 Units (not administered)  aspirin chewable tablet 243 mg (243 mg Oral Given 01/24/17 1835)  morphine 4 MG/ML injection 4 mg (4 mg Intravenous Given 01/24/17 1850)     NEW OUTPATIENT MEDICATIONS STARTED DURING THIS VISIT:  New Prescriptions   No medications on file    Modified Medications   No medications on file    Discontinued Medications   No medications on file     Note:  This document was prepared using Dragon voice recognition software and may include unintentional dictation errors.    Hinda Kehr, MD 01/24/17 [id number]

## 2017-01-24 NOTE — Progress Notes (Signed)
I have added home meds to the patient's inpatient medications and altered dosing and frequency to reduce duplicate medications that may increase sedation and narcotic effect.

## 2017-01-24 NOTE — ED Triage Notes (Signed)
Pt reports dental abscess.

## 2017-01-24 NOTE — ED Notes (Signed)
Pt took 1 baby ASA at home earlier today.

## 2017-01-24 NOTE — H&P (Signed)
[facility] [facility] [place] NAME: Caleb [name]    MR#:  [id number]  DATE OF BIRTH:  [birth year]  DATE OF ADMISSION:  01/24/2017  PRIMARY CARE PHYSICIAN: Venida Jarvis, MD   REQUESTING/REFERRING PHYSICIAN: Dr. Karma Greaser  CHIEF COMPLAINT:   Chief Complaint  Patient presents with  . Dental Pain  . Chest Pain    HISTORY OF PRESENT ILLNESS:  Caleb [name]  is a 41 y.o. male with a known history of Hypertension, diabetes, tobacco use, anxiety, chronic back pain on narcotics presents to the emergency room complaining of progressively worsening bilateral lower tooth pain. He is supposed to see a dentist's on for the same. In the emergency room patient had acute onset of chest pain and last about 30 minutes improved with nitroglycerin. Patient has been having similar chest pain at rest over the past week. He has had elevated blood pressure over the last 2 days. Has been compliant with medications. Had an abnormal stress test followed by catheterization many years back. This was done here at [facility] but records unavailable. EKG shows ST depressions on lateral leads.  PAST MEDICAL HISTORY:   Past Medical History:  Diagnosis Date  . Anxiety   . Borderline diabetes   . GERD (gastroesophageal reflux disease)   . History of degenerative disc disease   . Hypercholesteremia   . Hypertension   . Knee pain, bilateral   . PTSD (post-traumatic stress disorder)     PAST SURGICAL HISTORY:   Past Surgical History:  Procedure Laterality Date  . Knee surgeries      SOCIAL HISTORY:   Social History  Substance Use Topics  . Smoking status: Current Every Day Smoker    Packs/day: 1.00    Years: 20.00    Types: Cigarettes  . Smokeless tobacco: Never Used  . Alcohol use 2.4 oz/week    4 Cans of beer per week     Comment: occas.     FAMILY HISTORY:   Family History  Problem Relation Age of Onset  . Diabetes Mother   . Cancer  Paternal Grandmother   . Cancer Paternal Grandfather     DRUG ALLERGIES:   Allergies  Allergen Reactions  . Bee Pollen Anaphylaxis  . Contrast Media [Iodinated Diagnostic Agents] Anaphylaxis  . Metformin And Related Anaphylaxis    Unknown   . Lisinopril Swelling    REVIEW OF SYSTEMS:   Review of Systems  Constitutional: Positive for malaise/fatigue. Negative for chills and fever.  HENT: Negative for sore throat.   Eyes: Negative for blurred vision, double vision and pain.  Respiratory: Positive for shortness of breath. Negative for cough, hemoptysis and wheezing.   Cardiovascular: Positive for chest pain. Negative for palpitations, orthopnea and leg swelling.  Gastrointestinal: Negative for abdominal pain, constipation, diarrhea, heartburn, nausea and vomiting.  Genitourinary: Negative for dysuria and hematuria.  Musculoskeletal: Positive for back pain. Negative for joint pain.  Skin: Negative for rash.  Neurological: Positive for weakness. Negative for sensory change, speech change, focal weakness and headaches.  Endo/Heme/Allergies: Does not bruise/bleed easily.  Psychiatric/Behavioral: Negative for depression. The patient is not nervous/anxious.     MEDICATIONS AT HOME:   Prior to Admission medications   Medication Sig Start Date End Date Taking? Authorizing Provider  atorvastatin (LIPITOR) 80 MG tablet Take 1 tablet by mouth at bedtime.  03/04/15   Historical Provider, MD  ciprofloxacin (CIPRO) 500 MG tablet Take 1 tablet (500 mg total) by mouth 2 (two)  times daily. 02/25/16   Lisa Roca, MD  clotrimazole (MYCELEX) 10 MG troche Take 1 tablet (10 mg total) by mouth 5 (five) times daily. 07/18/16   Jenise V Bacon Menshew, PA-C  diazepam (VALIUM) 5 MG tablet Take 10 mg by mouth at bedtime.  01/14/15   Historical Provider, MD  diclofenac sodium (VOLTAREN) 1 % GEL Apply 2 g topically 4 (four) times daily.    Historical Provider, MD  diphenhydrAMINE (BENADRYL) 25 mg capsule Take  50 mg by mouth every 6 (six) hours as needed for itching or allergies.    Historical Provider, MD  gabapentin (NEURONTIN) 100 MG capsule Take 300 mg by mouth at bedtime. 08/24/15   Historical Provider, MD  losartan-hydrochlorothiazide (HYZAAR) 100-25 MG per tablet Take 1 tablet by mouth daily. 02/25/15   Historical Provider, MD  methocarbamol (ROBAXIN-750) 750 MG tablet Take 1 tablet (750 mg total) by mouth every 8 (eight) hours as needed for muscle spasms. 10/18/15   Victorino Dike, FNP  metroNIDAZOLE (FLAGYL) 500 MG tablet Take 1 tablet (500 mg total) by mouth 2 (two) times daily. 02/25/16   Lisa Roca, MD  ondansetron (ZOFRAN) 4 MG tablet Take 1 tablet (4 mg total) by mouth daily as needed for nausea or vomiting. 11/11/15   Orbie Pyo, MD  oxyCODONE (ROXICODONE) 15 MG immediate release tablet Take 15 mg by mouth 5 (five) times daily. For pain. 09/25/15   Historical Provider, MD  pantoprazole (PROTONIX) 40 MG tablet Take 1 tablet by mouth daily. 12/15/14   Historical Provider, MD  penicillin v potassium (VEETID) 500 MG tablet Take 1 tablet (500 mg total) by mouth 4 (four) times daily. 07/18/16   Jenise V Bacon Menshew, PA-C  predniSONE (STERAPRED UNI-PAK 21 TAB) 10 MG (21) TBPK tablet Take 6 tablets on day 1 Take 5 tablets on day 2 Take 4 tablets on day 3 Take 3 tablets on day 4 Take 2 tablets on day 5 Take 1 tablet on day 6 10/18/15   Cari B Triplett, FNP     VITAL SIGNS:  Blood pressure (!) 165/105, pulse 85, temperature 98 F (36.7 C), temperature source Oral, resp. rate 18, height 6' (1.829 m), weight 111.1 kg (245 lb), SpO2 96 %.  PHYSICAL EXAMINATION:  Physical Exam  GENERAL:  41 y.o.-year-old patient lying in the bed with no acute distress.  EYES: Pupils equal, round, reactive to light and accommodation. No scleral icterus. Extraocular muscles intact.  HEENT: Head atraumatic, normocephalic. Oropharynx and nasopharynx clear. No oropharyngeal erythema, moist oral mucosa   Multiple dental cavities. No pus seen. No redness or swelling on the jaw. NECK:  Supple, no jugular venous distention. No thyroid enlargement, no tenderness.  LUNGS: Normal breath sounds bilaterally, no wheezing, rales, rhonchi. No use of accessory muscles of respiration.  CARDIOVASCULAR: S1, S2 normal. No murmurs, rubs, or gallops.  ABDOMEN: Soft, nontender, nondistended. Bowel sounds present. No organomegaly or mass.  EXTREMITIES: No pedal edema, cyanosis, or clubbing. + 2 pedal & radial pulses b/l.   NEUROLOGIC: Cranial nerves II through XII are intact. No focal Motor or sensory deficits appreciated b/l PSYCHIATRIC: The patient is alert and oriented x 3. Good affect.  SKIN: No obvious rash, lesion, or ulcer.   LABORATORY PANEL:   CBC  Recent Labs Lab 01/24/17 1809  WBC 12.0*  HGB 19.9*  HCT 55.9*  PLT 216   ------------------------------------------------------------------------------------------------------------------  Chemistries   Recent Labs Lab 01/24/17 1809  NA 137  K 3.8  CL 95*  CO2 31  GLUCOSE 162*  BUN 7  CREATININE 0.94  CALCIUM 9.6  AST 33  ALT 42  ALKPHOS 93  BILITOT 0.8   ------------------------------------------------------------------------------------------------------------------  Cardiac Enzymes  Recent Labs Lab 01/24/17 1809  TROPONINI <0.03   ------------------------------------------------------------------------------------------------------------------  RADIOLOGY:  Dg Chest 2 View  Result Date: 01/24/2017 CLINICAL DATA:  Intermittent chest pain x2 days EXAM: CHEST  2 VIEW COMPARISON:  12/06/2015 FINDINGS: The heart size and mediastinal contours are within normal limits. Both lungs are clear. The visualized skeletal structures are unremarkable. IMPRESSION: No active cardiopulmonary disease. Electronically Signed   By: Ashley Royalty M.D.   On: 01/24/2017 18:35   IMPRESSION AND PLAN:   * Unstable angina with ST depressions Start  aspirin, statin. Heparin drip. Check echocardiogram. Telemetry monitoring. NPO after midnight. Consult cardiology for catheterization. Repeat troponin. Nitroglycerin when necessary  * Accelerated hypertension Resume medications. IV when necessary hydralazine.  * Dental pain We'll start Augmentin. Follow-up with a dentist as outpatient.  * Diet-controlled diabetes mellitus Check HbA1c. Sliding scale insulin.  * Chronic back pain on narcotic medications Continue home medication  * DVT prophylaxis. On heparin drip.  All the records are reviewed and case discussed with ED provider. Management plans discussed with the patient, family and they are in agreement.  CODE STATUS: FULL CODE  TOTAL TIME TAKING CARE OF THIS PATIENT: 40 minutes.   Hillary Bow R M.D on 01/24/2017 at 7:26 PM  Between 7am to 6pm - Pager - [phone]  After 6pm go to [email or web address] - password [email or web address] [facility]  Office  [phone]  CC: Primary care physician; Venida Jarvis, MD  Note: This dictation was prepared with Dragon dictation along with smaller phrase technology. Any transcriptional errors that result from this process are unintentional.

## 2017-01-24 NOTE — ED Notes (Signed)
Patient transported to X-ray 

## 2017-01-24 NOTE — Progress Notes (Signed)
ANTICOAGULATION CONSULT NOTE - Initial Consult  Pharmacy Consult for Heparin drip Indication: chest pain/ACS  Allergies  Allergen Reactions  . Bee Pollen Anaphylaxis  . Contrast Media [Iodinated Diagnostic Agents] Anaphylaxis  . Metformin And Related Anaphylaxis    Unknown   . Lisinopril Swelling    Patient Measurements: Height: 6' (182.9 cm) Weight: 245 lb (111.1 kg) IBW/kg (Calculated) : 77.6 Heparin Dosing Weight: 101.2 kg  Vital Signs: Temp: 98 F (36.7 C) (03/07 1714) Temp Source: Oral (03/07 1714) BP: 165/105 (03/07 1816) Pulse Rate: 85 (03/07 1816)  Labs:  Recent Labs  01/24/17 1809 01/24/17 1840  HGB 19.9*  --   HCT 55.9*  --   PLT 216  --   LABPROT  --  12.1  INR  --  0.90  CREATININE 0.94  --   TROPONINI <0.03  --     Estimated Creatinine Clearance: 134.5 mL/min (by C-G formula based on SCr of 0.94 mg/dL).   Medical History: Past Medical History:  Diagnosis Date  . Anxiety   . Borderline diabetes   . GERD (gastroesophageal reflux disease)   . History of degenerative disc disease   . Hypercholesteremia   . Hypertension   . Knee pain, bilateral   . PTSD (post-traumatic stress disorder)     Medications:  Scheduled:  . amoxicillin-clavulanate  1 tablet Oral Q12H  . heparin  4,000 Units Intravenous Once  . [START ON 01/25/2017] insulin aspart  0-9 Units Subcutaneous TID WC  . sodium chloride flush  3 mL Intravenous Q12H   Infusions:  . sodium chloride    . heparin      Assessment: 41 yo M with Chest pain, HTN.  he had some nonspecific ST-T segment changes before but there is some well-defined ST segment depression in this EKG. Patient does not take anticoagulants at home per PTA med list.  Goal of Therapy:  Heparin level 0.3-0.7 units/ml Monitor platelets by anticoagulation protocol: Yes   Plan:  Give 4000 units bolus x 1 Start heparin infusion at 1400 units/hr Check anti-Xa level in 6 hours and daily while on heparin Continue to  monitor H&H and platelets   Benjamin Casanas A 01/24/2017,7:23 PM

## 2017-01-25 ENCOUNTER — Inpatient Hospital Stay (HOSPITAL_COMMUNITY)
Admit: 2017-01-25 | Discharge: 2017-01-25 | Disposition: A | Discharge status: Home / Self Care | Payer: Medicare Other | Attending: Internal Medicine | Admitting: Internal Medicine

## 2017-01-25 DIAGNOSIS — R079 Chest pain, unspecified: Secondary | ICD-10-CM | POA: Diagnosis not present

## 2017-01-25 DIAGNOSIS — I200 Unstable angina: Principal | ICD-10-CM

## 2017-01-25 LAB — BASIC METABOLIC PANEL
ANION GAP: 12 (ref 5–15)
BUN: 8 mg/dL (ref 6–20)
CALCIUM: 9.6 mg/dL (ref 8.9–10.3)
CO2: 26 mmol/L (ref 22–32)
Chloride: 96 mmol/L — ABNORMAL LOW (ref 101–111)
Creatinine, Ser: 0.88 mg/dL (ref 0.61–1.24)
Glucose, Bld: 141 mg/dL — ABNORMAL HIGH (ref 65–99)
POTASSIUM: 3.2 mmol/L — AB (ref 3.5–5.1)
Sodium: 134 mmol/L — ABNORMAL LOW (ref 135–145)

## 2017-01-25 LAB — CBC
HEMATOCRIT: 55.9 % — AB (ref 40.0–52.0)
HEMOGLOBIN: 19.3 g/dL — AB (ref 13.0–18.0)
MCH: 33.3 pg (ref 26.0–34.0)
MCHC: 34.6 g/dL (ref 32.0–36.0)
MCV: 96.4 fL (ref 80.0–100.0)
Platelets: 205 10*3/uL (ref 150–440)
RBC: 5.8 MIL/uL (ref 4.40–5.90)
RDW: 14.8 % — AB (ref 11.5–14.5)
WBC: 9.6 10*3/uL (ref 3.8–10.6)

## 2017-01-25 LAB — GLUCOSE, CAPILLARY
GLUCOSE-CAPILLARY: 147 mg/dL — AB (ref 65–99)
GLUCOSE-CAPILLARY: 220 mg/dL — AB (ref 65–99)
Glucose-Capillary: 151 mg/dL — ABNORMAL HIGH (ref 65–99)

## 2017-01-25 LAB — ECHOCARDIOGRAM COMPLETE
HEIGHTINCHES: 72 in
Weight: 3849.6 oz

## 2017-01-25 LAB — HEPARIN LEVEL (UNFRACTIONATED)

## 2017-01-25 LAB — TROPONIN I

## 2017-01-25 MED ORDER — PENICILLIN V POTASSIUM 500 MG PO TABS: 500.0000 mg | ORAL_TABLET | Freq: Four times a day (QID) | ORAL | 0 refills | Status: DC

## 2017-01-25 MED ORDER — HEPARIN BOLUS VIA INFUSION
3000.0000 [IU] | Freq: Once | INTRAVENOUS | Status: AC
  Administered 2017-01-25: 3000 [IU] via INTRAVENOUS
  Filled 2017-01-25: qty 3000

## 2017-01-25 MED ORDER — FIRST-DUKES MOUTHWASH MT SUSP: 5.0000 mL | Freq: Three times a day (TID) | OROMUCOSAL | 0 refills | Status: DC

## 2017-01-25 NOTE — Consult Note (Signed)
CARDIOLOGY CONSULT NOTE  Patient ID: Caleb [name] MRN: [id number] DOB/AGE: 1976/04/13 41 y.o.  Admit date: 01/24/2017 Referring Physician:  Dr. Darvin Neighbours Primary Physician: Venida Jarvis, MD Primary Cardiologist : New Reason for Consultation : chest pain  Date:  01/25/2017    Chief Complaint  Patient presents with  . Dental Pain  . Chest Pain      History of Present Illness: Caleb [name] is a 41 y.o. male with past medical history of hypertension, borderline diabetes, tobacco use, anxiety and chronic back pain on narcotics. He had previous cardiac catheterization done in 2014 by Dr. Saralyn Pilar which showed minor irregularities with no evidence of obstructive disease. The catheterization was done due to abnormal EKG.  The patient presented with dental pain due to possible infection. While in the emergency room, he started having substernal chest pain which lasted for more than one hour. EKG showed borderline ST depression in the lateral leads and thus he was admitted. He is chest pain-free at the present time. He denies any recent exertional chest pain and this seems to be an isolated event. His troponin has been negative. He has chronic exertional dyspnea with no recent worsening.    Past Medical History:  Diagnosis Date  . Anxiety   . Borderline diabetes   . GERD (gastroesophageal reflux disease)   . History of degenerative disc disease   . Hypercholesteremia   . Hypertension   . Knee pain, bilateral   . PTSD (post-traumatic stress disorder)     Past Surgical History:  Procedure Laterality Date  . Knee surgeries       Current Facility-Administered Medications  Medication Dose Route Frequency Provider Last Rate Last Dose  . 0.9 %  sodium chloride infusion   Intravenous Continuous Hillary Bow, MD 100 mL/hr at 01/25/17 0407    . acetaminophen (TYLENOL) tablet 650 mg  650 mg Oral Q6H PRN Hillary Bow, MD   650 mg at 01/24/17 1947   Or  .  acetaminophen (TYLENOL) suppository 650 mg  650 mg Rectal Q6H PRN Srikar Sudini, MD      . albuterol (PROVENTIL) (2.5 MG/3ML) 0.083% nebulizer solution 2.5 mg  2.5 mg Nebulization Q2H PRN Hillary Bow, MD      . amoxicillin-clavulanate (AUGMENTIN) 875-125 MG per tablet 1 tablet  1 tablet Oral Q12H Hillary Bow, MD   1 tablet at 01/24/17 2238  . aspirin EC tablet 81 mg  81 mg Oral Daily Harrie Foreman, MD      . atorvastatin (LIPITOR) tablet 80 mg  80 mg Oral QHS Hillary Bow, MD   80 mg at 01/24/17 2238  . baclofen (LIORESAL) tablet 10 mg  10 mg Oral TID PRN Harrie Foreman, MD   10 mg at 01/24/17 2338  . bisacodyl (DULCOLAX) suppository 10 mg  10 mg Rectal Daily PRN Srikar Sudini, MD      . diazepam (VALIUM) tablet 5 mg  5 mg Oral QHS PRN Harrie Foreman, MD      . heparin ADULT infusion 100 units/mL (25000 units/243mL sodium chloride 0.45%)  1,800 Units/hr Intravenous Continuous Hinda Kehr, MD 18 mL/hr at 01/25/17 0813 1,800 Units/hr at 01/25/17 0813  . hydrALAZINE (APRESOLINE) injection 10 mg  10 mg Intravenous Q6H PRN Srikar Sudini, MD      . losartan (COZAAR) tablet 100 mg  100 mg Oral Daily Srikar Sudini, MD       And  . hydrochlorothiazide (HYDRODIURIL) tablet 25 mg  25 mg Oral Daily Srikar  Sudini, MD      . insulin aspart (novoLOG) injection 0-9 Units  0-9 Units Subcutaneous TID WC Hillary Bow, MD   2 Units at 01/25/17 0759  . nitroGLYCERIN (NITROSTAT) SL tablet 0.4 mg  0.4 mg Sublingual Q5 min PRN Srikar Sudini, MD      . ondansetron (ZOFRAN) tablet 4 mg  4 mg Oral Q6H PRN Hillary Bow, MD       Or  . ondansetron (ZOFRAN) injection 4 mg  4 mg Intravenous Q6H PRN Srikar Sudini, MD      . oxyCODONE (Oxy IR/ROXICODONE) immediate release tablet 15 mg  15 mg Oral Q6H PRN Harrie Foreman, MD   15 mg at 01/25/17 9323  . pantoprazole (PROTONIX) EC tablet 40 mg  40 mg Oral Daily Srikar Sudini, MD      . polyethylene glycol (MIRALAX / GLYCOLAX) packet 17 g  17 g Oral Daily PRN  Srikar Sudini, MD      . pregabalin (LYRICA) capsule 75 mg  75 mg Oral TID Harrie Foreman, MD   75 mg at 01/24/17 2338  . sodium chloride flush (NS) 0.9 % injection 3 mL  3 mL Intravenous Q12H Srikar Sudini, MD   3 mL at 01/24/17 2339    Allergies:   Bee pollen; Contrast media [iodinated diagnostic agents]; Metformin and related; and Lisinopril    Social History:  The patient  reports that he has been smoking Cigarettes.  He has a 20.00 pack-year smoking history. He has never used smokeless tobacco. He reports that he drinks about 2.4 oz of alcohol per week . He reports that he does not use drugs.   Family History:  The patient's family history includes Cancer in his paternal grandfather and paternal grandmother; Diabetes in his mother.    ROS:  Please see the history of present illness.   Otherwise, review of systems are positive for none.   All other systems are reviewed and negative.    PHYSICAL EXAM: VS:  BP 128/68 (BP Location: Right Arm)   Pulse (!) 52   Temp 98.1 F (36.7 C) (Oral)   Resp 18   Ht 6' (1.829 m)   Wt 240 lb 9.6 oz (109.1 kg)   SpO2 91%   BMI 32.63 kg/m  , BMI Body mass index is 32.63 kg/m. GEN: Well nourished, well developed, in no acute distress  HEENT: normal  Neck: no JVD, carotid bruits, or masses Cardiac: RRR; no murmurs, rubs, or gallops,no edema  Respiratory:  clear to auscultation bilaterally, normal work of breathing GI: soft, nontender, nondistended, + BS MS: no deformity or atrophy  Skin: warm and dry, no rash Neuro:  Strength and sensation are intact Psych: euthymic mood, full affect   EKG:   Personally reviewed by me and showed: Normal sinus rhythm with borderline lateral ST depression. I reviewed his prior EKGs since 2014 and he had similar abnormalities on prior EKGs. Telemetry recently reviewed by me and showed: Normal sinus rhythm.  Recent Labs: 01/24/2017: ALT 42 01/25/2017: BUN 8; Creatinine, Ser 0.88; Hemoglobin 19.3; Platelets 205;  Potassium 3.2; Sodium 134    Lipid Panel    Component Value Date/Time   CHOL 256 (H) 11/10/2014 0511   TRIG 107 11/10/2014 0511   HDL 55 11/10/2014 0511   VLDL 21 11/10/2014 0511   LDLCALC 180 (H) 11/10/2014 0511      Wt Readings from Last 3 Encounters:  01/25/17 240 lb 9.6 oz (109.1 kg)  09/14/16 250 lb (  113.4 kg)  07/18/16 240 lb (108.9 kg)      Other studies Reviewed: Additional studies/ records that were reviewed today include: I reviewed his previous cardiac catheterization images which showed minor irregularities..   ASSESSMENT AND PLAN:  1.  Atypical chest pain: Multiple risk factors for coronary artery disease. EKG is abnormal but he had abnormal EKG in the past. I was able to review his cardiac catheterization images in 2014 which showed only minor irregularities with no evidence of obstructive disease. 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 Ejection fraction was normal. Due to patient's symptoms, I recommended proceeding with a pharmacologic nuclear stress test but the patient declined due to claustrophobia. I did offer him cardiac catheterization as an alternative but after discussion the patient does not want anything done at the present time. I did review his echocardiogram images at the bedside which overall were unremarkable with normal LV systolic function and wall motion. Recommend ambulating the patient and monitoring symptoms. He can be discharged home from a cardiac standpoint with outpatient follow-up with [place] within 2 weeks to reevaluate symptoms.  2. Essential hypertension: Blood pressure was elevated on presentation but improved now. Blood pressure medications were resumed.    Signed,  Kathlyn Sacramento, MD  01/25/2017 8:58 AM    [facility]

## 2017-01-25 NOTE — Progress Notes (Signed)
Pt discharged to home via wc.  Instructions and rx given to pt.  Questions answered.  No distress.  

## 2017-01-25 NOTE — Progress Notes (Signed)
Pt arrived via stretcher from ED. Pt. A&O, skin intact. Heparin infusing @ 26ml/hr. Telemetry monitor applied and called to [place] pt to room and call bell. Hospital booklet given.

## 2017-01-25 NOTE — Discharge Instructions (Signed)
Dental Abscess A dental abscess is pus in or around a tooth. Follow these instructions at home:  Take medicines only as told by your dentist.  If you were prescribed antibiotic medicine, finish all of it even if you start to feel better.  Rinse your mouth (gargle) often with salt water.  Do not drive or use heavy machinery, like a lawn mower, while taking pain medicine.  Do not apply heat to the outside of your mouth.  Keep all follow-up visits as told by your dentist. This is important. Contact a doctor if:  Your pain is worse, and medicine does not help. Get help right away if:  You have a fever or chills.  Your symptoms suddenly get worse.  You have a very bad headache.  You have problems breathing or swallowing.  You have trouble opening your mouth.  You have puffiness (swelling) in your neck or around your eye. This information is not intended to replace advice given to you by your health care provider. Make sure you discuss any questions you have with your health care provider. Document Released: 03/23/2015 Document Revised: 04/13/2016 Document Reviewed: 11/03/2014 Elsevier Interactive Patient Education  2017 Elsevier Inc.  

## 2017-01-25 NOTE — Progress Notes (Signed)
Pt refusing bed alarm. Educated on reason for alarm. Will continue to monitor and assess.

## 2017-01-25 NOTE — Plan of Care (Signed)
Problem: Safety: Goal: Ability to remain free from injury will improve Outcome: Progressing Fall precautions in place  Problem: Pain Managment: Goal: General experience of comfort will improve Outcome: Not Progressing Prn medications  Problem: Tissue Perfusion: Goal: Risk factors for ineffective tissue perfusion will decrease Outcome: Progressing Heparin gtt infusing  Problem: Activity: Goal: Risk for activity intolerance will decrease Outcome: Progressing Heparin gtt infusing  Problem: Cardiac: Goal: Ability to achieve and maintain adequate cardiovascular perfusion will improve Outcome: Progressing Cardiac consult pending

## 2017-01-25 NOTE — Progress Notes (Signed)
ANTICOAGULATION CONSULT NOTE - Initial Consult  Pharmacy Consult for Heparin drip Indication: chest pain/ACS  Allergies  Allergen Reactions  . Bee Pollen Anaphylaxis  . Contrast Media [Iodinated Diagnostic Agents] Anaphylaxis  . Metformin And Related Anaphylaxis    Unknown   . Lisinopril Swelling    Patient Measurements: Height: 6' (182.9 cm) Weight: 245 lb (111.1 kg) IBW/kg (Calculated) : 77.6 Heparin Dosing Weight: 101.2 kg  Vital Signs: Temp: 98 F (36.7 C) (03/07 2108) Temp Source: Oral (03/07 2108) BP: 157/91 (03/07 2108) Pulse Rate: 85 (03/07 2108)  Labs:  Recent Labs  01/24/17 1809 01/24/17 1840 01/24/17 2139 01/25/17 0310  HGB 19.9*  --   --  19.3*  HCT 55.9*  --   --  55.9*  PLT 216  --   --  205  APTT  --  27  --   --   LABPROT  --  12.1  --   --   INR  --  0.90  --   --   HEPARINUNFRC  --   --   --  <0.10*  CREATININE 0.94  --   --  0.88  TROPONINI <0.03  --  <0.03 <0.03    Estimated Creatinine Clearance: 143.6 mL/min (by C-G formula based on SCr of 0.88 mg/dL).   Medical History: Past Medical History:  Diagnosis Date  . Anxiety   . Borderline diabetes   . GERD (gastroesophageal reflux disease)   . History of degenerative disc disease   . Hypercholesteremia   . Hypertension   . Knee pain, bilateral   . PTSD (post-traumatic stress disorder)     Medications:  Scheduled:  . amoxicillin-clavulanate  1 tablet Oral Q12H  . aspirin EC  81 mg Oral Daily  . atorvastatin  80 mg Oral QHS  . heparin  3,000 Units Intravenous Once  . losartan  100 mg Oral Daily   And  . hydrochlorothiazide  25 mg Oral Daily  . insulin aspart  0-9 Units Subcutaneous TID WC  . pantoprazole  40 mg Oral Daily  . pregabalin  75 mg Oral TID  . sodium chloride flush  3 mL Intravenous Q12H   Infusions:  . sodium chloride 100 mL/hr at 01/25/17 0407  . heparin 1,400 Units/hr (01/24/17 1948)    Assessment: 41 yo M with Chest pain, HTN.  he had some nonspecific ST-T  segment changes before but there is some well-defined ST segment depression in this EKG. Patient does not take anticoagulants at home per PTA med list.  Goal of Therapy:  Heparin level 0.3-0.7 units/ml Monitor platelets by anticoagulation protocol: Yes   Plan:  Give 4000 units bolus x 1 Start heparin infusion at 1400 units/hr Check anti-Xa level in 6 hours and daily while on heparin Continue to monitor H&H and platelets  3/8 03:00 heparin level <0.1. 3000 unit bolus and increase rate to 1800 units/hr. Recheck in 6 hours.  Yuritzi Kamp S 01/25/2017,4:54 AM

## 2017-01-25 NOTE — Progress Notes (Signed)
*  PRELIMINARY RESULTS* Echocardiogram 2D Echocardiogram has been performed.  Sherrie Sport 01/25/2017, 11:01 AM

## 2017-01-26 LAB — HEMOGLOBIN A1C
Hgb A1c MFr Bld: 5.9 % — ABNORMAL HIGH (ref 4.8–5.6)
Mean Plasma Glucose: 123 mg/dL

## 2017-01-26 LAB — HIV ANTIBODY (ROUTINE TESTING W REFLEX): HIV SCREEN 4TH GENERATION: NONREACTIVE

## 2017-01-28 NOTE — Discharge Summary (Signed)
[facility] [place] NAME: Caleb [name]    MR#:  [id number]  DATE OF BIRTH:  1976-07-02  DATE OF ADMISSION:  01/24/2017   ADMITTING PHYSICIAN: Hillary Bow, MD  DATE OF DISCHARGE: 01/25/2017 12:55 PM  PRIMARY CARE PHYSICIAN: Venida Jarvis, MD   ADMISSION DIAGNOSIS:  Unstable angina ([place]) [I20.0] Uncontrolled hypertension [I10] Pain, dental [K08.89] DISCHARGE DIAGNOSIS:  Active Problems:   Unstable angina ([place])  SECONDARY DIAGNOSIS:   Past Medical History:  Diagnosis Date  . Anxiety   . Borderline diabetes   . GERD (gastroesophageal reflux disease)   . History of degenerative disc disease   . Hypercholesteremia   . Hypertension   . Knee pain, bilateral   . PTSD (post-traumatic stress disorder)    HOSPITAL COURSE:  41 y.o. male with past medical history of hypertension, borderline diabetes, tobacco use, anxiety and chronic back pain on narcotics. He had previous cardiac catheterization done in 2014 by Dr. Saralyn Pilar which showed minor irregularities with no evidence of obstructive disease. The catheterization was done due to abnormal EKG.   * Dental Abscess: being D/Ced on oral Abx. He is scheduled to follow up with dentist locally and this was highly encouraged. He is in agreement.  * Atypical chest pain: Multiple risk factors for coronary artery disease. EKG is abnormal but he had abnormal EKG in the past. I was able to review his cardiac catheterization images in 2014 which showed only minor irregularities with no evidence of obstructive disease. 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 Ejection fraction was normal. - Due to patient's symptoms, Cardio recommended proceeding with a pharmacologic nuclear stress test but the patient declined due to claustrophobia. They also offeree him cardiac catheterization as an alternative but patient does not want anything done at the present time. - his echocardiogram  were unremarkable with normal LV systolic function and wall motion per cardio - He has been offered follow up with cardio as an outpt if symptoms recurs but he is doubtful as he doesn't have insurance. - He is chest pain-free at the present time. He denies any recent exertional chest pain and this seems to be an isolated event. His troponin has been negative. He has chronic exertional dyspnea with no recent worsening. DISCHARGE CONDITIONS:  stable CONSULTS OBTAINED:  Treatment Team:  Wellington Hampshire, MD DRUG ALLERGIES:   Allergies  Allergen Reactions  . Bee Pollen Anaphylaxis  . Contrast Media [Iodinated Diagnostic Agents] Anaphylaxis  . Metformin And Related Anaphylaxis    Unknown   . Lisinopril Swelling   DISCHARGE MEDICATIONS:   Allergies as of 01/25/2017      Reactions   Bee Pollen Anaphylaxis   Contrast Media [iodinated Diagnostic Agents] Anaphylaxis   Metformin And Related Anaphylaxis   Unknown   Lisinopril Swelling      Medication List    STOP taking these medications   ciprofloxacin 500 MG tablet Commonly known as:  CIPRO   predniSONE 10 MG (21) Tbpk tablet Commonly known as:  STERAPRED UNI-PAK 21 TAB     TAKE these medications   aspirin EC 81 MG tablet Take 81 mg by mouth daily.   atorvastatin 80 MG tablet Commonly known as:  LIPITOR Take 80 mg by mouth at bedtime.   baclofen 10 MG tablet Commonly known as:  LIORESAL Take 10 mg by mouth 3 (three) times daily as needed.   clotrimazole 10 MG troche Commonly known as:  MYCELEX Take 1 tablet (  10 mg total) by mouth 5 (five) times daily.   diazepam 5 MG tablet Commonly known as:  VALIUM Take 10 mg by mouth at bedtime.   diclofenac sodium 1 % Gel Commonly known as:  VOLTAREN Apply 2 g topically 4 (four) times daily.   diphenhydrAMINE 25 mg capsule Commonly known as:  BENADRYL Take 50 mg by mouth every 6 (six) hours as needed for itching or allergies.   esomeprazole 40 MG capsule Commonly known as:   NEXIUM Take 40 mg by mouth daily.   FIRST-DUKES MOUTHWASH Susp Use as directed 5 mLs in the mouth or throat 3 (three) times daily.   gabapentin 100 MG capsule Commonly known as:  NEURONTIN Take 300 mg by mouth at bedtime.   losartan-hydrochlorothiazide 100-25 MG tablet Commonly known as:  HYZAAR Take 1 tablet by mouth daily.   methocarbamol 750 MG tablet Commonly known as:  ROBAXIN-750 Take 1 tablet (750 mg total) by mouth every 8 (eight) hours as needed for muscle spasms.   metroNIDAZOLE 500 MG tablet Commonly known as:  FLAGYL Take 1 tablet (500 mg total) by mouth 2 (two) times daily.   ondansetron 4 MG tablet Commonly known as:  ZOFRAN Take 1 tablet (4 mg total) by mouth daily as needed for nausea or vomiting.   oxyCODONE 15 MG immediate release tablet Commonly known as:  ROXICODONE Take 15 mg by mouth 5 (five) times daily. For pain.   pantoprazole 40 MG tablet Commonly known as:  PROTONIX Take 1 tablet by mouth daily.   penicillin v potassium 500 MG tablet Commonly known as:  VEETID Take 1 tablet (500 mg total) by mouth 4 (four) times daily.   pregabalin 75 MG capsule Commonly known as:  LYRICA Take 75 mg by mouth 3 (three) times daily as needed.        DISCHARGE INSTRUCTIONS:   DIET:  Cardiac diet DISCHARGE CONDITION:  Good ACTIVITY:  Activity as tolerated OXYGEN:  Home Oxygen: No.  Oxygen Delivery: room air DISCHARGE LOCATION:  home   If you experience worsening of your admission symptoms, develop shortness of breath, life threatening emergency, suicidal or homicidal thoughts you must seek medical attention immediately by calling 911 or calling your MD immediately  if symptoms less severe.  You Must read complete instructions/literature along with all the possible adverse reactions/side effects for all the Medicines you take and that have been prescribed to you. Take any new Medicines after you have completely understood and accpet all the possible  adverse reactions/side effects.   Please note  You were cared for by a hospitalist during your hospital stay. If you have any questions about your discharge medications or the care you received while you were in the hospital after you are discharged, you can call the unit and asked to speak with the hospitalist on call if the hospitalist that took care of you is not available. Once you are discharged, your primary care physician will handle any further medical issues. Please note that NO REFILLS for any discharge medications will be authorized once you are discharged, as it is imperative that you return to your primary care physician (or establish a relationship with a primary care physician if you do not have one) for your aftercare needs so that they can reassess your need for medications and monitor your lab values.    On the day of Discharge:  VITAL SIGNS:  Blood pressure (!) 143/73, pulse 74, temperature 97.4 F (36.3 C), temperature source Oral, resp.  rate 19, height 6' (1.829 m), weight 109.1 kg (240 lb 9.6 oz), SpO2 95 %. PHYSICAL EXAMINATION:  GENERAL:  41 y.o.-year-old patient lying in the bed with no acute distress.  EYES: Pupils equal, round, reactive to light and accommodation. No scleral icterus. Extraocular muscles intact.  HEENT: Head atraumatic, normocephalic. Oropharynx and nasopharynx clear. Dental Abscess on both sides NECK:  Supple, no jugular venous distention. No thyroid enlargement, no tenderness.  LUNGS: Normal breath sounds bilaterally, no wheezing, rales,rhonchi or crepitation. No use of accessory muscles of respiration.  CARDIOVASCULAR: S1, S2 normal. No murmurs, rubs, or gallops.  ABDOMEN: Soft, non-tender, non-distended. Bowel sounds present. No organomegaly or mass.  EXTREMITIES: No pedal edema, cyanosis, or clubbing.  NEUROLOGIC: Cranial nerves II through XII are intact. Muscle strength 5/5 in all extremities. Sensation intact. Gait not checked.  PSYCHIATRIC: The  patient is alert and oriented x 3.  SKIN: No obvious rash, lesion, or ulcer.  DATA REVIEW:   CBC  Recent Labs Lab 01/25/17 0310  WBC 9.6  HGB 19.3*  HCT 55.9*  PLT 205    Chemistries   Recent Labs Lab 01/24/17 1809 01/25/17 0310  NA 137 134*  K 3.8 3.2*  CL 95* 96*  CO2 31 26  GLUCOSE 162* 141*  BUN 7 8  CREATININE 0.94 0.88  CALCIUM 9.6 9.6  AST 33  --   ALT 42  --   ALKPHOS 93  --   BILITOT 0.8  --     Follow-up Information    BURGERT,WOODWARD, MD. Schedule an appointment as soon as possible for a visit in 1 week.   Specialty:  Family Medicine Why:  PLEASE CALL THE OFFICE  AND SCHEDULE THIS APPOINTMENT Contact information: [address] [place] [address] [phone]           Management plans discussed with the patient, family and they are in agreement.  CODE STATUS: Prior   TOTAL TIME TAKING CARE OF THIS PATIENT: 45bminutes.    Max Sane M.D on 01/28/2017 at 11:06 AM  Between 7am to 6pm - Pager - [phone]  After 6pm go to [email or web address] - Proofreader  [facility]  Office  [phone]  CC: Primary care physician; Venida Jarvis, MD   Note: This dictation was prepared with Dragon dictation along with smaller phrase technology. Any transcriptional errors that result from this process are unintentional.

## 2017-01-29 DIAGNOSIS — I119 Hypertensive heart disease without heart failure: Secondary | ICD-10-CM | POA: Insufficient documentation

## 2017-03-18 DIAGNOSIS — E559 Vitamin D deficiency, unspecified: Secondary | ICD-10-CM | POA: Insufficient documentation

## 2017-03-18 DIAGNOSIS — E1165 Type 2 diabetes mellitus with hyperglycemia: Secondary | ICD-10-CM

## 2017-03-18 DIAGNOSIS — E1129 Type 2 diabetes mellitus with other diabetic kidney complication: Secondary | ICD-10-CM

## 2017-03-18 DIAGNOSIS — E119 Type 2 diabetes mellitus without complications: Secondary | ICD-10-CM

## 2017-03-28 ENCOUNTER — Encounter: Payer: Self-pay | Admitting: *Deleted

## 2017-03-28 ENCOUNTER — Emergency Department
Admission: EM | Admit: 2017-03-28 | Discharge: 2017-03-28 | Disposition: A | Discharge status: Home / Self Care | Payer: Medicare HMO | Attending: Emergency Medicine | Admitting: Emergency Medicine

## 2017-03-28 DIAGNOSIS — Z7982 Long term (current) use of aspirin: Secondary | ICD-10-CM | POA: Insufficient documentation

## 2017-03-28 DIAGNOSIS — F17210 Nicotine dependence, cigarettes, uncomplicated: Secondary | ICD-10-CM | POA: Insufficient documentation

## 2017-03-28 DIAGNOSIS — Z79899 Other long term (current) drug therapy: Secondary | ICD-10-CM | POA: Insufficient documentation

## 2017-03-28 DIAGNOSIS — Y999 Unspecified external cause status: Secondary | ICD-10-CM | POA: Diagnosis not present

## 2017-03-28 DIAGNOSIS — I10 Essential (primary) hypertension: Secondary | ICD-10-CM | POA: Diagnosis not present

## 2017-03-28 DIAGNOSIS — Y929 Unspecified place or not applicable: Secondary | ICD-10-CM | POA: Insufficient documentation

## 2017-03-28 DIAGNOSIS — T1592XA Foreign body on external eye, part unspecified, left eye, initial encounter: Secondary | ICD-10-CM

## 2017-03-28 DIAGNOSIS — W228XXA Striking against or struck by other objects, initial encounter: Secondary | ICD-10-CM | POA: Diagnosis not present

## 2017-03-28 DIAGNOSIS — Y9389 Activity, other specified: Secondary | ICD-10-CM | POA: Insufficient documentation

## 2017-03-28 DIAGNOSIS — S0502XA Injury of conjunctiva and corneal abrasion without foreign body, left eye, initial encounter: Secondary | ICD-10-CM | POA: Diagnosis not present

## 2017-03-28 MED ORDER — EYE WASH OPHTH SOLN
OPHTHALMIC | Status: AC
  Filled 2017-03-28: qty 118

## 2017-03-28 MED ORDER — TETRACAINE HCL 0.5 % OP SOLN
1.0000 [drp] | Freq: Once | OPHTHALMIC | Status: AC
  Administered 2017-03-28: 1 [drp] via OPHTHALMIC
  Filled 2017-03-28: qty 2

## 2017-03-28 MED ORDER — FLUORESCEIN SODIUM 0.6 MG OP STRP
1.0000 | ORAL_STRIP | Freq: Once | OPHTHALMIC | Status: AC
  Administered 2017-03-28: 1 via OPHTHALMIC
  Filled 2017-03-28: qty 1

## 2017-03-28 MED ORDER — ERYTHROMYCIN 5 MG/GM OP OINT: 1.0000 "application " | TOPICAL_OINTMENT | Freq: Four times a day (QID) | OPHTHALMIC | 0 refills | Status: DC

## 2017-03-28 MED ORDER — ERYTHROMYCIN 5 MG/GM OP OINT
TOPICAL_OINTMENT | Freq: Once | OPHTHALMIC | Status: AC
  Administered 2017-03-28: 1 via OPHTHALMIC
  Filled 2017-03-28: qty 1

## 2017-03-28 MED ORDER — KETOROLAC TROMETHAMINE 0.5 % OP SOLN: 1.0000 [drp] | Freq: Four times a day (QID) | OPHTHALMIC | 0 refills | Status: DC

## 2017-03-28 MED ORDER — EYE WASH OPHTH SOLN
1.0000 [drp] | OPHTHALMIC | Status: DC | PRN
  Administered 2017-03-28: 1 [drp] via OPHTHALMIC

## 2017-03-28 NOTE — ED Notes (Signed)
Pt reports that he was weed eating yesterday and something flew up and it him in his left eye - he woke up this morning and his eye is watering and he feels a pressure behind his eye and he states that any light causing a sharp stabbing pain to go through his eye

## 2017-03-28 NOTE — ED Triage Notes (Signed)
States he was weeding yesterday and something flew off into his left eye and he now has left eye burning and a headache, arrives with left eye covered with gauze

## 2017-03-28 NOTE — ED Provider Notes (Signed)
[facility] Emergency Department Provider Note   ____________________________________________   I have reviewed the triage vital signs and the nursing notes.   HISTORY  Chief Complaint Eye Pain    HPI Caleb [name] is a 41 y.o. male presents with left eye pain, blurred vision and subconjunctival hemorrhage after being struck in the eye with debris while weed eating yesterday. Patient noted waking up this morning with increased pain and pressure in the left eye. He describes the pain as sharp and being extremely light sensitive. Pt denies fever, chills, chest pain, chest tightness, shortness of breath, abdominal pain, nausea and vomiting.  Past Medical History:  Diagnosis Date  . Anxiety   . Borderline diabetes   . GERD (gastroesophageal reflux disease)   . History of degenerative disc disease   . Hypercholesteremia   . Hypertension   . Knee pain, bilateral   . PTSD (post-traumatic stress disorder)     Patient Active Problem List   Diagnosis Date Noted  . Unstable angina ([place]) 01/24/2017  . Diverticulitis 02/26/2016  . Diverticulitis of large intestine without perforation or abscess without bleeding     Past Surgical History:  Procedure Laterality Date  . Knee surgeries      Prior to Admission medications   Medication Sig Start Date End Date Taking? Authorizing Provider  aspirin EC 81 MG tablet Take 81 mg by mouth daily.    [provider]  atorvastatin (LIPITOR) 80 MG tablet Take 80 mg by mouth at bedtime.  03/04/15   [provider]  baclofen (LIORESAL) 10 MG tablet Take 10 mg by mouth 3 (three) times daily as needed. 12/11/16 12/11/17  [provider]  clotrimazole (MYCELEX) 10 MG troche Take 1 tablet (10 mg total) by mouth 5 (five) times daily. Patient not taking: Reported on 01/24/2017 07/18/16   Menshew, Dannielle Karvonen, PA-C  diazepam (VALIUM) 5 MG tablet Take 10 mg by mouth at bedtime.  01/14/15   [provider]  diclofenac sodium (VOLTAREN) 1 % GEL Apply 2 g topically 4 (four) times daily.    [provider]  Diphenhyd-Hydrocort-Nystatin (FIRST-DUKES MOUTHWASH) SUSP Use as directed 5 mLs in the mouth or throat 3 (three) times daily. 01/25/17   Max Sane, MD  diphenhydrAMINE (BENADRYL) 25 mg capsule Take 50 mg by mouth every 6 (six) hours as needed for itching or allergies.    [provider]  erythromycin ophthalmic ointment Place 1 application into the left eye 4 (four) times daily. 03/28/17   Orla Jolliff M, PA-C  esomeprazole (NEXIUM) 40 MG capsule Take 40 mg by mouth daily. 06/13/16 06/13/17  [provider]  gabapentin (NEURONTIN) 100 MG capsule Take 300 mg by mouth at bedtime. 08/24/15   [provider]  ketorolac (ACULAR) 0.5 % ophthalmic solution Place 1 drop into the left eye 4 (four) times daily. 03/28/17   Orlondo Holycross M, PA-C  losartan-hydrochlorothiazide (HYZAAR) 100-25 MG per tablet Take 1 tablet by mouth daily. 02/25/15   [provider]  methocarbamol (ROBAXIN-750) 750 MG tablet Take 1 tablet (750 mg total) by mouth every 8 (eight) hours as needed for muscle spasms. Patient not taking: Reported on 01/24/2017 10/18/15   Victorino Dike, FNP  metroNIDAZOLE (FLAGYL) 500 MG tablet Take 1 tablet (500 mg total) by mouth 2 (two) times daily. Patient not taking: Reported on 01/24/2017 02/25/16   Lisa Roca, MD  ondansetron (ZOFRAN) 4 MG tablet Take 1 tablet (4 mg total) by mouth  daily as needed for nausea or vomiting. 11/11/15   Orbie Pyo, MD  oxyCODONE (ROXICODONE) 15 MG immediate release tablet Take 15 mg by mouth 5 (five) times daily. For pain. 09/25/15   [provider]  pantoprazole (PROTONIX) 40 MG tablet Take 1 tablet by mouth daily. 12/15/14   [provider]  penicillin v potassium (VEETID) 500 MG tablet Take 1 tablet (500 mg total) by mouth 4 (four) times daily. 01/25/17   Max Sane, MD  pregabalin (LYRICA)  75 MG capsule Take 75 mg by mouth 3 (three) times daily as needed.  06/13/16 06/13/17  [provider]    Allergies Bee pollen; Contrast media [iodinated diagnostic agents]; Metformin and related; and Lisinopril  Family History  Problem Relation Age of Onset  . Diabetes Mother   . Cancer Paternal Grandmother   . Cancer Paternal Grandfather     Social History Social History  Substance Use Topics  . Smoking status: Current Every Day Smoker    Packs/day: 1.00    Years: 20.00    Types: Cigarettes  . Smokeless tobacco: Never Used  . Alcohol use 2.4 oz/week    4 Cans of beer per week     Comment: occas.     Review of Systems Constitutional: Negative for fever/chills Eyes: Left eye pain, blurred vision and decreased acuity.  ENT: Negative for sore throat.  Cardiovascular: Denies chest pain. Respiratory: Denies shortness of breath. Musculoskeletal: Negative for back pain.   Skin: Negative / Postive for rash. Neurological: Negative for headaches.  ____________________________________________   PHYSICAL EXAM:  VITAL SIGNS: ED Triage Vitals  Enc Vitals Group     BP 03/28/17 1742 (!) 152/87     Pulse Rate 03/28/17 1742 96     Resp 03/28/17 1742 16     Temp 03/28/17 1742 98.2 F (36.8 C)     Temp Source 03/28/17 1742 Oral     SpO2 03/28/17 1742 97 %     Weight 03/28/17 1741 254 lb (115.2 kg)     Height 03/28/17 1741 6' (1.829 m)     Head Circumference --      Peak Flow --      Pain Score 03/28/17 1741 9     Pain Loc --      Pain Edu? --      Excl. in [place]? --     Constitutional: Alert and oriented. Well appearing and in mild distress.  Head: Normocephalic and atraumatic. Eyes: Subconjunctival hemorrhage, PERRL, EOMI, Left eye pain, decreased visual acuity, blurred vision, excessive tearing.  Cardiovascular: Normal rate, regular rhythm. Normal distal pulses. Respiratory: Normal respiratory effort.  Musculoskeletal: Nontender with normal range of motion in all  extremities. FROM x 4 extremities. Neurologic: Normal speech and language. No gross focal neurologic deficits are appreciated.  Skin:  Skin is warm, dry and intact. No rash noted. Psychiatric: Mood and affect are normal. Patient exhibits appropriate insight and judgment. ____________________________________________   LABS (all labs ordered are listed, but only abnormal results are displayed)  Labs Reviewed - No data to display ____________________________________________  EKG None  ____________________________________________  RADIOLOGY None ____________________________________________   PROCEDURES  Procedure(s) perform: Fluorescein stain eye exam performed following physical exam:  Performed by: Jerolyn Shin Authorized by: Jerolyn Shin Consent: Verbal consent obtained. Risks and benefits: risks, benefits and alternatives were discussed Consent given by: patient Irrigated left eye with Eye Stream eye wash.  (1) drop Tetracaine instilled followed by instillation of fluorescein dye with fluorescein  strip.  Examination with Sherral Hammers slit lamp performed: Small corneal abrasion noted at 2:00 position of left eye. No other defects noted.  Following the exam:  Left eye irrigated with Eye Stream and (1) drop of Tetracaine instilled.  Patient tolerance: Patient tolerated the procedure well with no immediate complications.    Critical Care performed: no ____________________________________________   INITIAL IMPRESSION / ASSESSMENT AND PLAN / ED COURSE  Pertinent labs & imaging results that were available during my care of the patient were reviewed by me and considered in my medical decision making (see chart for details).  Patient presents with left eye corneal abrasion secondary to foreign body debris while weed eating yesterday. Physical exam and Woods slit lamp exam with fluorescein staining were reassuring that no other injuries were sustained. Patient noted mild relief  of symptoms with tetracaine drop following Woods slit lamp exam. Patient received initial application of half inch ribbon of erythromycin eye ointment prior to discharge. Patient will receive a prescription for erythromycin eye ointment to continue 4 times a day, in addition to, Acular drops for pain management.  Patient / Family informed of clinical course, understand medical decision-making process, and agree with plan. Patient was advised to follow up opthalmology and was also advised to return to the emergency department for symptoms that change or worsen if unable to schedule an appointment.     ____________________________________________   FINAL CLINICAL IMPRESSION(S) / ED DIAGNOSES  Final diagnoses:  Abrasion of left cornea, initial encounter  Foreign body of left eye, initial encounter     NEW MEDICATIONS STARTED DURING THIS VISIT:  Discharge Medication List as of 03/28/2017  6:45 PM    START taking these medications   Details  erythromycin ophthalmic ointment Place 1 application into the left eye 4 (four) times daily., Starting Wed 03/28/2017, Print    ketorolac (ACULAR) 0.5 % ophthalmic solution Place 1 drop into the left eye 4 (four) times daily., Starting Wed 03/28/2017, Print         Note:  This document was prepared using Dragon voice recognition software and may include unintentional dictation errors.  Procedures        Denis Carreon, Laroy Apple, PA-C 03/28/17 1924    Lavonia Drafts, MD 03/29/17 1520

## 2017-04-22 ENCOUNTER — Emergency Department
Admission: EM | Admit: 2017-04-22 | Discharge: 2017-04-22 | Disposition: A | Discharge status: Home / Self Care | Payer: Medicare HMO | Attending: Emergency Medicine | Admitting: Emergency Medicine

## 2017-04-22 ENCOUNTER — Emergency Department: Payer: Medicare HMO

## 2017-04-22 ENCOUNTER — Encounter: Payer: Self-pay | Admitting: Emergency Medicine

## 2017-04-22 DIAGNOSIS — Z7982 Long term (current) use of aspirin: Secondary | ICD-10-CM | POA: Diagnosis not present

## 2017-04-22 DIAGNOSIS — S39012A Strain of muscle, fascia and tendon of lower back, initial encounter: Secondary | ICD-10-CM | POA: Insufficient documentation

## 2017-04-22 DIAGNOSIS — Y9389 Activity, other specified: Secondary | ICD-10-CM | POA: Diagnosis not present

## 2017-04-22 DIAGNOSIS — Y929 Unspecified place or not applicable: Secondary | ICD-10-CM | POA: Insufficient documentation

## 2017-04-22 DIAGNOSIS — Y999 Unspecified external cause status: Secondary | ICD-10-CM | POA: Diagnosis not present

## 2017-04-22 DIAGNOSIS — Z79899 Other long term (current) drug therapy: Secondary | ICD-10-CM | POA: Insufficient documentation

## 2017-04-22 DIAGNOSIS — I10 Essential (primary) hypertension: Secondary | ICD-10-CM | POA: Insufficient documentation

## 2017-04-22 DIAGNOSIS — M545 Low back pain, unspecified: Secondary | ICD-10-CM

## 2017-04-22 DIAGNOSIS — W010XXA Fall on same level from slipping, tripping and stumbling without subsequent striking against object, initial encounter: Secondary | ICD-10-CM | POA: Insufficient documentation

## 2017-04-22 DIAGNOSIS — F17210 Nicotine dependence, cigarettes, uncomplicated: Secondary | ICD-10-CM | POA: Insufficient documentation

## 2017-04-22 MED ORDER — DIAZEPAM 5 MG PO TABS
5.0000 mg | ORAL_TABLET | Freq: Once | ORAL | Status: AC
  Administered 2017-04-22: 5 mg via ORAL
  Filled 2017-04-22: qty 1

## 2017-04-22 MED ORDER — KETOROLAC TROMETHAMINE 30 MG/ML IJ SOLN
30.0000 mg | Freq: Once | INTRAMUSCULAR | Status: AC
  Administered 2017-04-22: 30 mg via INTRAMUSCULAR
  Filled 2017-04-22: qty 1

## 2017-04-22 MED ORDER — NAPROXEN 500 MG PO TABS: 500.0000 mg | ORAL_TABLET | Freq: Two times a day (BID) | ORAL | 0 refills | Status: DC

## 2017-04-22 MED ORDER — CYCLOBENZAPRINE HCL 10 MG PO TABS: 10.0000 mg | ORAL_TABLET | Freq: Three times a day (TID) | ORAL | 0 refills | Status: DC | PRN

## 2017-04-22 NOTE — ED Notes (Signed)
Patient transported to XR. 

## 2017-04-22 NOTE — ED Triage Notes (Signed)
Pt presents with c/o low back pain since falling 2 days ago; says sometimes his knees just give out; history of chronic back pain due to degenerative disc disease; also has chronic bilateral knee pain; take percocet 7.5mg  and last took 1/2 pill around 2am with no relief; denies urinary incontinence;

## 2017-04-22 NOTE — ED Provider Notes (Signed)
[facility] Emergency Department Provider Note ____________________________________________  Time seen: Approximately 7:04 AM  I have reviewed the triage vital signs and the nursing notes.   HISTORY  Chief Complaint Back Pain    HPI Caleb [name] is a 41 y.o. male who presents to the emergency department for evaluation of low back pain. Two days ago, his "knees gave out," which is a common occurrence for him. He fell and landed on his tailbone. Since the fall, he has had a "knot" and pain. This is different from his chronic back pain. He took 1/2 tablet of percocet without relief. He denies striking his head or loss of consciousness after the fall.  Past Medical History:  Diagnosis Date  . Anxiety   . Borderline diabetes   . GERD (gastroesophageal reflux disease)   . History of degenerative disc disease   . Hypercholesteremia   . Hypertension   . Knee pain, bilateral   . PTSD (post-traumatic stress disorder)     Patient Active Problem List   Diagnosis Date Noted  . Unstable angina ([place]) 01/24/2017  . Diverticulitis 02/26/2016  . Diverticulitis of large intestine without perforation or abscess without bleeding     Past Surgical History:  Procedure Laterality Date  . HERNIA REPAIR    . Knee surgeries    . TONSILLECTOMY      Prior to Admission medications   Medication Sig Start Date End Date Taking? Authorizing Provider  aspirin EC 81 MG tablet Take 81 mg by mouth daily.    [provider]  atorvastatin (LIPITOR) 80 MG tablet Take 80 mg by mouth at bedtime.  03/04/15   [provider]  baclofen (LIORESAL) 10 MG tablet Take 10 mg by mouth 3 (three) times daily as needed. 12/11/16 12/11/17  [provider]  clotrimazole (MYCELEX) 10 MG troche Take 1 tablet (10 mg total) by mouth 5 (five) times daily. Patient not taking: Reported on 01/24/2017 07/18/16   Menshew, Dannielle Karvonen, PA-C  cyclobenzaprine (FLEXERIL) 10 MG  tablet Take 1 tablet (10 mg total) by mouth 3 (three) times daily as needed for muscle spasms. 04/22/17   Jackson Fetters B, FNP  diazepam (VALIUM) 5 MG tablet Take 10 mg by mouth at bedtime.  01/14/15   [provider]  diclofenac sodium (VOLTAREN) 1 % GEL Apply 2 g topically 4 (four) times daily.    [provider]  Diphenhyd-Hydrocort-Nystatin (FIRST-DUKES MOUTHWASH) SUSP Use as directed 5 mLs in the mouth or throat 3 (three) times daily. 01/25/17   Max Sane, MD  diphenhydrAMINE (BENADRYL) 25 mg capsule Take 50 mg by mouth every 6 (six) hours as needed for itching or allergies.    [provider]  erythromycin ophthalmic ointment Place 1 application into the left eye 4 (four) times daily. 03/28/17   Little, Traci M, PA-C  esomeprazole (NEXIUM) 40 MG capsule Take 40 mg by mouth daily. 06/13/16 06/13/17  [provider]  gabapentin (NEURONTIN) 100 MG capsule Take 300 mg by mouth at bedtime. 08/24/15   [provider]  ketorolac (ACULAR) 0.5 % ophthalmic solution Place 1 drop into the left eye 4 (four) times daily. 03/28/17   Little, Traci M, PA-C  losartan-hydrochlorothiazide (HYZAAR) 100-25 MG per tablet Take 1 tablet by mouth daily. 02/25/15   [provider]  methocarbamol (ROBAXIN-750) 750 MG tablet Take 1 tablet (750 mg total) by mouth every 8 (eight) hours as needed for muscle spasms. Patient not taking: Reported on 01/24/2017 10/18/15  Halea Lieb B, FNP  metroNIDAZOLE (FLAGYL) 500 MG tablet Take 1 tablet (500 mg total) by mouth 2 (two) times daily. Patient not taking: Reported on 01/24/2017 02/25/16   Lisa Roca, MD  naproxen (NAPROSYN) 500 MG tablet Take 1 tablet (500 mg total) by mouth 2 (two) times daily with a meal. 04/22/17   Marvel Mcphillips B, FNP  ondansetron (ZOFRAN) 4 MG tablet Take 1 tablet (4 mg total) by mouth daily as needed for nausea or vomiting. 11/11/15   Orbie Pyo, MD  oxyCODONE (ROXICODONE) 15 MG immediate release  tablet Take 15 mg by mouth 5 (five) times daily. For pain. 09/25/15   [provider]  pantoprazole (PROTONIX) 40 MG tablet Take 1 tablet by mouth daily. 12/15/14   [provider]  penicillin v potassium (VEETID) 500 MG tablet Take 1 tablet (500 mg total) by mouth 4 (four) times daily. 01/25/17   Max Sane, MD  pregabalin (LYRICA) 75 MG capsule Take 75 mg by mouth 3 (three) times daily as needed.  06/13/16 06/13/17  [provider]    Allergies Bee pollen; Contrast media [iodinated diagnostic agents]; Metformin and related; and Lisinopril  Family History  Problem Relation Age of Onset  . Diabetes Mother   . Cancer Paternal Grandmother   . Cancer Paternal Grandfather     Social History Social History  Substance Use Topics  . Smoking status: Current Every Day Smoker    Packs/day: 1.00    Years: 20.00    Types: Cigarettes  . Smokeless tobacco: Never Used  . Alcohol use 2.4 oz/week    4 Cans of beer per week     Comment: occas.     Review of Systems Constitutional: No recent illness. No fever Respiratory: Negative for shortness of breath. Musculoskeletal: Positive for lower back pain Skin: Negative for rash, lesion or wound  Neurological: Negative for loss of bowel or bladder control. Negative for radicular pain  ____________________________________________   PHYSICAL EXAM:  VITAL SIGNS: ED Triage Vitals  Enc Vitals Group     BP 04/22/17 0651 140/90     Pulse Rate 04/22/17 0651 91     Resp 04/22/17 0651 18     Temp 04/22/17 0651 97.9 F (36.6 C)     Temp Source 04/22/17 0651 Oral     SpO2 04/22/17 0651 94 %     Weight 04/22/17 0648 250 lb (113.4 kg)     Height 04/22/17 0648 6' (1.829 m)     Head Circumference --      Peak Flow --      Pain Score 04/22/17 0651 10     Pain Loc --      Pain Edu? --      Excl. in [place]? --     Constitutional: Alert and oriented. Well appearing and in no acute distress. Eyes: Conjunctiva is clear.   Head:  Atraumatic Neck: AROM without midline tenderness Respiratory: Respirations are even and unlabored. Musculoskeletal: Midline and paraspinal tenderness over the lower lumbar area. Neurologic: Steady, unassisted gait. Pain with attempt at straight leg raise bilaterally.  Skin: Atraumatic.  Psychiatric: Normal behavior and affect.  ____________________________________________   LABS (all labs ordered are listed, but only abnormal results are displayed)  Labs Reviewed - No data to display ____________________________________________  RADIOLOGY  Lumbar spine is negative for acute bony abnormality per radiology. ____________________________________________   PROCEDURES  Procedure(s) performed: None  ____________________________________________   INITIAL IMPRESSION / ASSESSMENT AND PLAN / ED COURSE  Caleb [name]  Braian Tijerina is a 41 y.o. male who presents to the emergency department for evaluation of lower back pain after a nonsyncopal fall on Friday. While in the ER today, he was given Toradol IM and valium tablet with some relief. No evidence of acute fracture on lumbar film. He will be discharged home with prescriptions for naprosyn and flexeril. He is to follow up with his PCP or orthopedics for symptoms that are not improving over the next few days. He was instructed to return to the ER for symptoms that change or worsen if unable to schedule an appointment.  Pertinent labs & imaging results that were available during my care of the patient were reviewed by me and considered in my medical decision making (see chart for details).  _________________________________________   FINAL CLINICAL IMPRESSION(S) / ED DIAGNOSES  Final diagnoses:  Acute lumbar back pain  Strain of lumbar region, initial encounter    New Prescriptions   CYCLOBENZAPRINE (FLEXERIL) 10 MG TABLET    Take 1 tablet (10 mg total) by mouth 3 (three) times daily as needed for muscle spasms.   NAPROXEN (NAPROSYN)  500 MG TABLET    Take 1 tablet (500 mg total) by mouth 2 (two) times daily with a meal.    If controlled substance prescribed during this visit, 12 month history viewed on the [facility] prior to issuing an initial prescription for Schedule II or III opiod.    Victorino Dike, FNP 04/22/17 2122    Harvest Dark, MD 04/22/17 1415

## 2017-04-22 NOTE — Discharge Instructions (Signed)
Please follow up with your primary care provider for symptoms that are not improving over the next few days.  Return to the ER for symptoms that change or worsen if unable to schedule an appointment with orthopedics or your primary care provider.

## 2017-04-29 DIAGNOSIS — R42 Dizziness and giddiness: Secondary | ICD-10-CM | POA: Diagnosis not present

## 2017-04-29 DIAGNOSIS — Z5321 Procedure and treatment not carried out due to patient leaving prior to being seen by health care provider: Secondary | ICD-10-CM | POA: Insufficient documentation

## 2017-04-29 DIAGNOSIS — R739 Hyperglycemia, unspecified: Secondary | ICD-10-CM | POA: Diagnosis present

## 2017-04-29 LAB — BASIC METABOLIC PANEL
ANION GAP: 12 (ref 5–15)
BUN: 9 mg/dL (ref 6–20)
CALCIUM: 9.1 mg/dL (ref 8.9–10.3)
CO2: 28 mmol/L (ref 22–32)
CREATININE: 0.83 mg/dL (ref 0.61–1.24)
Chloride: 93 mmol/L — ABNORMAL LOW (ref 101–111)
Glucose, Bld: 324 mg/dL — ABNORMAL HIGH (ref 65–99)
Potassium: 2.9 mmol/L — ABNORMAL LOW (ref 3.5–5.1)
Sodium: 133 mmol/L — ABNORMAL LOW (ref 135–145)

## 2017-04-29 LAB — GLUCOSE, CAPILLARY: GLUCOSE-CAPILLARY: 345 mg/dL — AB (ref 65–99)

## 2017-04-29 LAB — CBC
HCT: 44.7 % (ref 40.0–52.0)
HEMOGLOBIN: 16.1 g/dL (ref 13.0–18.0)
MCH: 33.1 pg (ref 26.0–34.0)
MCHC: 36.1 g/dL — ABNORMAL HIGH (ref 32.0–36.0)
MCV: 91.7 fL (ref 80.0–100.0)
PLATELETS: 233 10*3/uL (ref 150–440)
RBC: 4.87 MIL/uL (ref 4.40–5.90)
RDW: 12.8 % (ref 11.5–14.5)
WBC: 10.2 10*3/uL (ref 3.8–10.6)

## 2017-04-29 NOTE — ED Triage Notes (Signed)
Patient reports being border line diabetic.  States sugars have been up for several days and last week given diagnosis of diabetes that MD wanted to attempt diet control.  Reports over the weekend sugars have been high and he has been very dizzy.

## 2017-04-30 ENCOUNTER — Telehealth: Payer: Self-pay | Admitting: Emergency Medicine

## 2017-04-30 ENCOUNTER — Emergency Department
Admission: EM | Admit: 2017-04-30 | Discharge: 2017-04-30 | Discharge status: Against Medical Advice | Payer: Medicare HMO | Attending: Emergency Medicine | Admitting: Emergency Medicine

## 2017-04-30 NOTE — Telephone Encounter (Signed)
Called patient due to lwot to inquire about condition and follow up plans. Girlfriend says patient is sleeping. Says it was really busy last night and they left, but plan to see pcp.   I asked her to call pcp with [facility] and ask them to at least review his labs.  I told her that they may ask them to return to the ED.

## 2017-04-30 NOTE — ED Notes (Signed)
0015 No answer when called for room.

## 2017-05-07 ENCOUNTER — Ambulatory Visit
Admission: RE | Admit: 2017-05-07 | Discharge: 2017-05-07 | Disposition: A | Discharge status: Home / Self Care | Payer: Medicare HMO | Source: Ambulatory Visit | Attending: Gastroenterology | Admitting: Gastroenterology

## 2017-05-07 ENCOUNTER — Ambulatory Visit: Payer: Medicare HMO | Admitting: Anesthesiology

## 2017-05-07 ENCOUNTER — Encounter: Payer: Self-pay | Admitting: *Deleted

## 2017-05-07 ENCOUNTER — Encounter
Admission: RE | Disposition: A | Discharge status: Home / Self Care | Payer: Self-pay | Source: Ambulatory Visit | Attending: Gastroenterology

## 2017-05-07 DIAGNOSIS — Z888 Allergy status to other drugs, medicaments and biological substances status: Secondary | ICD-10-CM | POA: Diagnosis not present

## 2017-05-07 DIAGNOSIS — Z86010 Personal history of colonic polyps: Secondary | ICD-10-CM | POA: Insufficient documentation

## 2017-05-07 DIAGNOSIS — Z91041 Radiographic dye allergy status: Secondary | ICD-10-CM | POA: Diagnosis not present

## 2017-05-07 DIAGNOSIS — Z79899 Other long term (current) drug therapy: Secondary | ICD-10-CM | POA: Diagnosis not present

## 2017-05-07 DIAGNOSIS — R7303 Prediabetes: Secondary | ICD-10-CM | POA: Diagnosis not present

## 2017-05-07 DIAGNOSIS — K920 Hematemesis: Secondary | ICD-10-CM | POA: Diagnosis present

## 2017-05-07 DIAGNOSIS — Z7982 Long term (current) use of aspirin: Secondary | ICD-10-CM | POA: Insufficient documentation

## 2017-05-07 DIAGNOSIS — K228 Other specified diseases of esophagus: Secondary | ICD-10-CM | POA: Insufficient documentation

## 2017-05-07 DIAGNOSIS — Z8673 Personal history of transient ischemic attack (TIA), and cerebral infarction without residual deficits: Secondary | ICD-10-CM | POA: Diagnosis not present

## 2017-05-07 DIAGNOSIS — K621 Rectal polyp: Secondary | ICD-10-CM | POA: Diagnosis not present

## 2017-05-07 DIAGNOSIS — F419 Anxiety disorder, unspecified: Secondary | ICD-10-CM | POA: Insufficient documentation

## 2017-05-07 DIAGNOSIS — K5730 Diverticulosis of large intestine without perforation or abscess without bleeding: Secondary | ICD-10-CM | POA: Insufficient documentation

## 2017-05-07 DIAGNOSIS — E7800 Pure hypercholesterolemia, unspecified: Secondary | ICD-10-CM | POA: Insufficient documentation

## 2017-05-07 DIAGNOSIS — Z79891 Long term (current) use of opiate analgesic: Secondary | ICD-10-CM | POA: Diagnosis not present

## 2017-05-07 DIAGNOSIS — K2960 Other gastritis without bleeding: Secondary | ICD-10-CM | POA: Insufficient documentation

## 2017-05-07 DIAGNOSIS — D124 Benign neoplasm of descending colon: Secondary | ICD-10-CM | POA: Diagnosis not present

## 2017-05-07 DIAGNOSIS — D125 Benign neoplasm of sigmoid colon: Secondary | ICD-10-CM | POA: Insufficient documentation

## 2017-05-07 DIAGNOSIS — K3189 Other diseases of stomach and duodenum: Secondary | ICD-10-CM | POA: Diagnosis not present

## 2017-05-07 DIAGNOSIS — F17200 Nicotine dependence, unspecified, uncomplicated: Secondary | ICD-10-CM | POA: Insufficient documentation

## 2017-05-07 DIAGNOSIS — Z791 Long term (current) use of non-steroidal anti-inflammatories (NSAID): Secondary | ICD-10-CM | POA: Insufficient documentation

## 2017-05-07 DIAGNOSIS — F4310 Post-traumatic stress disorder, unspecified: Secondary | ICD-10-CM | POA: Diagnosis not present

## 2017-05-07 DIAGNOSIS — Z885 Allergy status to narcotic agent status: Secondary | ICD-10-CM | POA: Diagnosis not present

## 2017-05-07 DIAGNOSIS — I10 Essential (primary) hypertension: Secondary | ICD-10-CM | POA: Diagnosis not present

## 2017-05-07 DIAGNOSIS — K219 Gastro-esophageal reflux disease without esophagitis: Secondary | ICD-10-CM | POA: Insufficient documentation

## 2017-05-07 HISTORY — PX: COLONOSCOPY WITH PROPOFOL: SHX5780

## 2017-05-07 HISTORY — PX: ESOPHAGOGASTRODUODENOSCOPY (EGD) WITH PROPOFOL: SHX5813

## 2017-05-07 HISTORY — DX: Cerebral infarction, unspecified: I63.9

## 2017-05-07 SURGERY — COLONOSCOPY WITH PROPOFOL
Anesthesia: General

## 2017-05-07 MED ORDER — SODIUM CHLORIDE 0.9 % IV SOLN: INTRAVENOUS | Status: DC

## 2017-05-07 MED ORDER — PROPOFOL 500 MG/50ML IV EMUL
INTRAVENOUS | Status: AC
  Filled 2017-05-07: qty 50

## 2017-05-07 MED ORDER — SODIUM CHLORIDE 0.9 % IV SOLN
INTRAVENOUS | Status: DC
  Administered 2017-05-07 (×2): via INTRAVENOUS

## 2017-05-07 MED ORDER — MIDAZOLAM HCL 2 MG/2ML IJ SOLN
INTRAMUSCULAR | Status: AC
  Administered 2017-05-07: 1 mg via INTRAVENOUS
  Filled 2017-05-07: qty 2

## 2017-05-07 MED ORDER — FENTANYL CITRATE (PF) 100 MCG/2ML IJ SOLN
INTRAMUSCULAR | Status: DC | PRN
  Administered 2017-05-07 (×2): 50 ug via INTRAVENOUS

## 2017-05-07 MED ORDER — PROPOFOL 500 MG/50ML IV EMUL
INTRAVENOUS | Status: DC | PRN
Start: 2017-05-07 — End: 2017-05-07
  Administered 2017-05-07: 180 ug/kg/min via INTRAVENOUS

## 2017-05-07 MED ORDER — PHENYLEPHRINE HCL 10 MG/ML IJ SOLN
INTRAMUSCULAR | Status: DC | PRN
  Administered 2017-05-07 (×4): 100 ug via INTRAVENOUS

## 2017-05-07 MED ORDER — FENTANYL CITRATE (PF) 100 MCG/2ML IJ SOLN
INTRAMUSCULAR | Status: AC
  Filled 2017-05-07: qty 2

## 2017-05-07 MED ORDER — GLYCOPYRROLATE 0.2 MG/ML IJ SOLN
INTRAMUSCULAR | Status: AC
  Filled 2017-05-07: qty 1

## 2017-05-07 MED ORDER — PROPOFOL 10 MG/ML IV BOLUS
INTRAVENOUS | Status: DC | PRN
  Administered 2017-05-07: 100 mg via INTRAVENOUS

## 2017-05-07 NOTE — Transfer of Care (Signed)
Immediate Anesthesia Transfer of Care Note  Patient: Caleb [name]  Procedure(s) Performed: Procedure(s): COLONOSCOPY WITH PROPOFOL (N/A) ESOPHAGOGASTRODUODENOSCOPY (EGD) WITH PROPOFOL (N/A)  Patient Location: PACU  Anesthesia Type:General  Level of Consciousness: sedated  Airway & Oxygen Therapy: Patient Spontanous Breathing and Patient connected to nasal cannula oxygen  Post-op Assessment: Report given to RN and Post -op Vital signs reviewed and stable  Post vital signs: Reviewed and stable  Last Vitals:  Vitals:   05/07/17 0749  BP: 136/86  Pulse: 77  Temp: 36.3 C    Last Pain:  Vitals:   05/07/17 0749  TempSrc: Tympanic  PainSc: 5       Patients Stated Pain Goal: 5 (12/11/22 1146)  Complications: No apparent anesthesia complications

## 2017-05-07 NOTE — Anesthesia Procedure Notes (Signed)
Date/Time: 05/07/2017 8:53 AM Performed by: Nelda Marseille Pre-anesthesia Checklist: Patient identified, Emergency Drugs available, Suction available, Patient being monitored and Timeout performed Oxygen Delivery Method: Nasal cannula

## 2017-05-07 NOTE — Op Note (Signed)
[facility] Gastroenterology Patient Name: Caleb [name] Procedure Date: 05/07/2017 8:30 AM MRN: [id number] Account #: [id number] Date of Birth: 1976-09-25 Admit Type: Outpatient Age: 41 Room: [facility] ENDO ROOM 1 Gender: Male Note Status: Finalized Procedure:            Colonoscopy Indications:          Personal history of colonic polyps Providers:            Lollie Sails, MD Referring MD:         Venida Jarvis (Referring MD) Medicines:            Monitored Anesthesia Care Complications:        No immediate complications. Procedure:            Pre-Anesthesia Assessment:                       - ASA Grade Assessment: III - A patient with severe                        systemic disease.                       After obtaining informed consent, the colonoscope was                        passed under direct vision. Throughout the procedure,                        the patient's blood pressure, pulse, and oxygen                        saturations were monitored continuously. The                        Colonoscope was introduced through the anus and                        advanced to the the cecum, identified by appendiceal                        orifice and ileocecal valve. The colonoscopy was                        performed with moderate difficulty. Successful                        completion of the procedure was aided by using manual                        pressure. The patient tolerated the procedure well. The                        quality of the bowel preparation was fair. Findings:      Two sessile polyps were found in the distal sigmoid colon. The polyps       were 2 to 3 mm in size. These polyps were removed with a cold biopsy       forceps. Resection and retrieval were complete.      A 2 mm polyp was found in the proximal descending colon. The polyp was       sessile. The  polyp was removed with a cold biopsy forceps. Resection and       retrieval were  complete.      A 3 mm polyp was found in the descending colon. The polyp was sessile.       The polyp was removed with a cold biopsy forceps. Resection and       retrieval were complete.      A 24 mm polyp was found at 45 cm proximal to the anus. The polyp was       pedunculated. The polyp was removed with a hot snare. Resection and       retrieval were complete, end of stalk noted to be cauterized. To prevent       bleeding after the polypectomy, one hemostatic clip was successfully       placed (MR conditional). There was no bleeding at the end of the       maneuver.      Three sessile polyps were found in the rectum. The polyps were 1 to 2 mm       in size. These polyps were removed with a cold biopsy forceps. Resection       and retrieval were complete.      The retroflexed view of the distal rectum and anal verge was normal and       showed no anal or rectal abnormalities.      Multiple small to medium diverticula were found in the sigmoid colon,       descending colon and transverse colon.      The digital rectal exam was normal. Impression:           - Preparation of the colon was fair.                       - Two 2 to 3 mm polyps in the distal sigmoid colon,                        removed with a cold biopsy forceps. Resected and                        retrieved.                       - One 2 mm polyp in the proximal descending colon,                        removed with a cold biopsy forceps. Resected and                        retrieved.                       - One 3 mm polyp in the descending colon, removed with                        a cold biopsy forceps. Resected and retrieved.                       - One 24 mm polyp at 45 cm proximal to the anus,                        removed with a hot snare. Resected and retrieved. Clip                        (  MR conditional) was placed.                       - Three 1 to 2 mm polyps in the rectum, removed with a                         cold biopsy forceps. Resected and retrieved.                       - The distal rectum and anal verge are normal on                        retroflexion view.                       - Diverticulosis in the sigmoid colon, in the                        descending colon and in the transverse colon. Recommendation:       - sedated flex sig with full prep in 3 months to                        recheck 45 cm polypectomy site depending on pathology                       - Await pathology results.                       - Return to GI clinic in 1 month. Procedure Code(s):    --- Professional ---                       [phone], Colonoscopy, flexible; with removal of tumor(s),                        polyp(s), or other lesion(s) by snare technique                       45380, 78, Colonoscopy, flexible; with biopsy, single                        or multiple Diagnosis Code(s):    --- Professional ---                       D12.5, Benign neoplasm of sigmoid colon                       K62.1, Rectal polyp                       D12.4, Benign neoplasm of descending colon                       Z86.010, Personal history of colonic polyps                       K57.30, Diverticulosis of large intestine without                        perforation or abscess without bleeding CPT copyright 2016 [employer]. All rights reserved. The codes documented in this report  are preliminary and upon coder review may  be revised to meet current compliance requirements. Lollie Sails, MD 05/07/2017 9:58:29 AM This report has been signed electronically. Number of Addenda: 0 Note Initiated On: 05/07/2017 8:30 AM Scope Withdrawal Time: 0 hours 24 minutes 51 seconds  Total Procedure Duration: 0 hours 38 minutes 10 seconds       [facility]

## 2017-05-07 NOTE — H&P (Signed)
Outpatient short stay form Pre-procedure 05/07/2017 8:38 AM Caleb Sails MD  Primary Physician: Dr Venida Jarvis  Reason for visit:  EGD and colonoscopy  History of present illness:  Patient is a 41 year old male presenting today as above. He has a personal history of gas after reflux for which she takes Nexium on a regular basis. He states that this helps his symptoms a lot. He has been having some occasional episodes of small amount of bright red hematemesis. Discussion with him this is not clear whether this could also be hemoptysis. He is a smoker. He tolerated his prep well. He denies any aspirin or blood thinners. There is a history of possible liver disease however checking his labs indicates a normal pro time and platelet count. He does occasionally use NSAIDs however.    Current Facility-Administered Medications:  .  0.9 %  sodium chloride infusion, , Intravenous, Continuous, Caleb Sails, MD, Last Rate: 20 mL/hr at 05/07/17 0804 .  0.9 %  sodium chloride infusion, , Intravenous, Continuous, Caleb Sails, MD  Prescriptions Prior to Admission  Medication Sig Dispense Refill Last Dose  . aspirin EC 81 MG tablet Take 81 mg by mouth daily.   Past Week at Unknown time  . atorvastatin (LIPITOR) 80 MG tablet Take 80 mg by mouth at bedtime.   3 Past Week at Unknown time  . baclofen (LIORESAL) 10 MG tablet Take 10 mg by mouth 3 (three) times daily as needed.   Past Week at Unknown time  . diclofenac sodium (VOLTAREN) 1 % GEL Apply 2 g topically 4 (four) times daily.   Past Week at Unknown time  . esomeprazole (NEXIUM) 40 MG capsule Take 40 mg by mouth daily.   05/06/2017 at Unknown time  . gabapentin (NEURONTIN) 100 MG capsule Take 300 mg by mouth at bedtime.  11 05/06/2017 at Unknown time  . losartan-hydrochlorothiazide (HYZAAR) 100-25 MG per tablet Take 1 tablet by mouth daily.  11 05/06/2017 at Unknown time  . naproxen (NAPROSYN) 500 MG tablet Take 1 tablet (500 mg total)  by mouth 2 (two) times daily with a meal. 30 tablet 0 Past Week at Unknown time  . oxyCODONE (ROXICODONE) 15 MG immediate release tablet Take 15 mg by mouth 5 (five) times daily. For pain.  0 05/06/2017 at Unknown time  . pregabalin (LYRICA) 75 MG capsule Take 75 mg by mouth 3 (three) times daily as needed.    05/06/2017 at Unknown time  . clotrimazole (MYCELEX) 10 MG troche Take 1 tablet (10 mg total) by mouth 5 (five) times daily. (Patient not taking: Reported on 01/24/2017) 35 tablet 0 Completed Course at Unknown time  . cyclobenzaprine (FLEXERIL) 10 MG tablet Take 1 tablet (10 mg total) by mouth 3 (three) times daily as needed for muscle spasms. (Patient not taking: Reported on 05/07/2017) 30 tablet 0 Not Taking at Unknown time  . diazepam (VALIUM) 5 MG tablet Take 10 mg by mouth at bedtime.    prn at prn  . Diphenhyd-Hydrocort-Nystatin (FIRST-DUKES MOUTHWASH) SUSP Use as directed 5 mLs in the mouth or throat 3 (three) times daily. (Patient not taking: Reported on 05/07/2017) 1 Bottle 0 Completed Course at Unknown time  . diphenhydrAMINE (BENADRYL) 25 mg capsule Take 50 mg by mouth every 6 (six) hours as needed for itching or allergies.   prn at prn  . erythromycin ophthalmic ointment Place 1 application into the left eye 4 (four) times daily. (Patient not taking: Reported on 05/07/2017) 3.5 g  0 Completed Course at Unknown time  . ketorolac (ACULAR) 0.5 % ophthalmic solution Place 1 drop into the left eye 4 (four) times daily. (Patient not taking: Reported on 05/07/2017) 5 mL 0 Completed Course at Unknown time  . methocarbamol (ROBAXIN-750) 750 MG tablet Take 1 tablet (750 mg total) by mouth every 8 (eight) hours as needed for muscle spasms. (Patient not taking: Reported on 01/24/2017) 30 tablet 0 Completed Course at Unknown time  . metroNIDAZOLE (FLAGYL) 500 MG tablet Take 1 tablet (500 mg total) by mouth 2 (two) times daily. (Patient not taking: Reported on 01/24/2017) 20 tablet 0 Completed Course at Unknown  time  . ondansetron (ZOFRAN) 4 MG tablet Take 1 tablet (4 mg total) by mouth daily as needed for nausea or vomiting. 10 tablet 0 prn at prn  . pantoprazole (PROTONIX) 40 MG tablet Take 1 tablet by mouth daily.   Not Taking at Unknown time  . penicillin v potassium (VEETID) 500 MG tablet Take 1 tablet (500 mg total) by mouth 4 (four) times daily. (Patient not taking: Reported on 05/07/2017) 40 tablet 0 Completed Course at Unknown time     Allergies  Allergen Reactions  . Bee Pollen Anaphylaxis  . Contrast Media [Iodinated Diagnostic Agents] Anaphylaxis  . Metformin And Related Anaphylaxis    Unknown   . Dilaudid [Hydromorphone Hcl]   . Lisinopril Swelling     Past Medical History:  Diagnosis Date  . Anxiety   . Borderline diabetes   . GERD (gastroesophageal reflux disease)   . History of degenerative disc disease   . Hypercholesteremia   . Hypertension   . Knee pain, bilateral   . PTSD (post-traumatic stress disorder)   . Stroke ([place]) 2015   mold stroke per patient    Review of systems:      Physical Exam    Heart and lungs: Regular rate and rhythm without rub or gallop, lungs are bilaterally clear.    HEENT: Normocephalic atraumatic eyes are anicteric    Other:     Pertinant exam for procedure: Soft nontender nondistended bowel sounds positive normoactive    Planned proceedures: EGD, colonoscopy and indicated procedures. I have discussed the risks benefits and complications of procedures to include not limited to bleeding, infection, perforation and the risk of sedation and the patient wishes to proceed.    Caleb Sails, MD Gastroenterology 05/07/2017  8:38 AM

## 2017-05-07 NOTE — Anesthesia Preprocedure Evaluation (Signed)
Anesthesia Evaluation  Patient identified by MRN, date of birth, ID band Patient awake    Reviewed: Allergy & Precautions, NPO status , reviewed documented beta blocker date and time Preop documentation limited or incomplete due to emergent nature of procedure.  Airway Mallampati: III       Dental  (+) Poor Dentition, Loose   Pulmonary COPD, Current Smoker,     + decreased breath sounds      Cardiovascular hypertension, Pt. on medications and Pt. on home beta blockers + angina  Rhythm:Regular     Neuro/Psych Anxiety CVA    GI/Hepatic Neg liver ROS, GERD  Medicated,  Endo/Other  negative endocrine ROS  Renal/GU negative Renal ROS     Musculoskeletal negative musculoskeletal ROS (+)   Abdominal   Peds  Hematology negative hematology ROS (+)   Anesthesia Other Findings   Reproductive/Obstetrics                             Anesthesia Physical Anesthesia Plan  ASA: III  Anesthesia Plan: General   Post-op Pain Management:    Induction: Intravenous  PONV Risk Score and Plan: 0  Airway Management Planned: Natural Airway and Nasal Cannula  Additional Equipment:   Intra-op Plan:   Post-operative Plan:   Informed Consent: I have reviewed the patients History and Physical, chart, labs and discussed the procedure including the risks, benefits and alternatives for the proposed anesthesia with the patient or authorized representative who has indicated his/her understanding and acceptance.     Plan Discussed with: CRNA  Anesthesia Plan Comments:         Anesthesia Quick Evaluation

## 2017-05-07 NOTE — Op Note (Signed)
[facility] Gastroenterology Patient Name: Owenn Rothermel Procedure Date: 05/07/2017 8:32 AM MRN: [id number] Account #: [id number] Date of Birth: Nov 17, 1976 Admit Type: Outpatient Age: 41 Room: [facility] ENDO ROOM 1 Gender: Male Note Status: Finalized Procedure:            Upper GI endoscopy Indications:          Gastro-esophageal reflux disease, Hematemesis Providers:            Lollie Sails, MD Referring MD:         Venida Jarvis (Referring MD) Medicines:            Monitored Anesthesia Care Complications:        No immediate complications. Procedure:            Pre-Anesthesia Assessment:                       - ASA Grade Assessment: III - A patient with severe                        systemic disease.                       After obtaining informed consent, the endoscope was                        passed under direct vision. Throughout the procedure,                        the patient's blood pressure, pulse, and oxygen                        saturations were monitored continuously. The Endoscope                        was introduced through the mouth, and advanced to the                        fourth part of duodenum. The patient tolerated the                        procedure well. Findings:      The Z-line was variable. Biopsies were taken with a cold forceps for       histology.      The exam of the esophagus was otherwise normal.      Patchy mild inflammation characterized by congestion (edema) was found       in the gastric antrum, there are multiple small mucosal nodules in the       antrum consistant with healing erosion. Biopsied.      The cardia and gastric fundus were normal on retroflexion.      Diffuse mild mucosal variance characterized by altered texture was found       in the third portion of the duodenum and in the fourth portion of the       duodenum. Biopsies were taken with a cold forceps for histology.      Biopsies were taken with a  cold forceps in the gastric body and in the       gastric antrum for histology.      minimal erosion, not inflamed on the left arytenoid fold, possible old       scar,  less prominant than noted on previous EGD. Impression:           - Z-line variable. Biopsied.                       - Erosive gastritis.                       - Mucosal variant in the duodenum. Biopsied.                       - Biopsies were taken with a cold forceps for histology                        in the gastric body and in the gastric antrum. Recommendation:       - Use Nexium (esomeprazole) 40 mg PO daily daily.                       - Refer to an ENT specialist at appointment to be                        scheduled. Procedure Code(s):    --- Professional ---                       [phone], Esophagogastroduodenoscopy, flexible, transoral;                        with biopsy, single or multiple Diagnosis Code(s):    --- Professional ---                       K22.8, Other specified diseases of esophagus                       K29.60, Other gastritis without bleeding                       K31.89, Other diseases of stomach and duodenum                       K21.9, Gastro-esophageal reflux disease without                        esophagitis                       K92.0, Hematemesis CPT copyright 2016 [employer] Association. All rights reserved. The codes documented in this report are preliminary and upon coder review may  be revised to meet current compliance requirements. Lollie Sails, MD 05/07/2017 9:06:50 AM This report has been signed electronically. Number of Addenda: 0 Note Initiated On: 05/07/2017 8:32 AM      [facility]

## 2017-05-07 NOTE — Anesthesia Post-op Follow-up Note (Cosign Needed)
Anesthesia QCDR form completed.        

## 2017-05-07 NOTE — Anesthesia Postprocedure Evaluation (Signed)
Anesthesia Post Note  Patient: Caleb [name]  Procedure(s) Performed: Procedure(s) (LRB): COLONOSCOPY WITH PROPOFOL (N/A) ESOPHAGOGASTRODUODENOSCOPY (EGD) WITH PROPOFOL (N/A)  Patient location during evaluation: PACU Anesthesia Type: General Level of consciousness: awake Pain management: pain level controlled Vital Signs Assessment: post-procedure vital signs reviewed and stable Respiratory status: spontaneous breathing Cardiovascular status: stable Anesthetic complications: no     Last Vitals:  Vitals:   05/07/17 1015 05/07/17 1025  BP: (!) 147/98 (!) 137/99  Pulse: 72 79  Resp: (!) 22 20  Temp:      Last Pain:  Vitals:   05/07/17 0955  TempSrc: Tympanic  PainSc:                  VAN STAVEREN,[name]

## 2017-05-08 ENCOUNTER — Encounter: Payer: Self-pay | Admitting: Gastroenterology

## 2017-05-08 LAB — SURGICAL PATHOLOGY

## 2017-05-10 DIAGNOSIS — D126 Benign neoplasm of colon, unspecified: Secondary | ICD-10-CM | POA: Insufficient documentation

## 2017-05-21 DIAGNOSIS — K2960 Other gastritis without bleeding: Secondary | ICD-10-CM | POA: Insufficient documentation

## 2017-05-28 ENCOUNTER — Emergency Department
Admission: EM | Admit: 2017-05-28 | Discharge: 2017-05-29 | Disposition: A | Discharge status: Home / Self Care | Payer: Medicare HMO | Attending: Emergency Medicine | Admitting: Emergency Medicine

## 2017-05-28 ENCOUNTER — Encounter: Payer: Self-pay | Admitting: Emergency Medicine

## 2017-05-28 DIAGNOSIS — J181 Lobar pneumonia, unspecified organism: Secondary | ICD-10-CM | POA: Insufficient documentation

## 2017-05-28 DIAGNOSIS — Z79899 Other long term (current) drug therapy: Secondary | ICD-10-CM | POA: Diagnosis not present

## 2017-05-28 DIAGNOSIS — I10 Essential (primary) hypertension: Secondary | ICD-10-CM | POA: Diagnosis not present

## 2017-05-28 DIAGNOSIS — Z7982 Long term (current) use of aspirin: Secondary | ICD-10-CM | POA: Diagnosis not present

## 2017-05-28 DIAGNOSIS — F17210 Nicotine dependence, cigarettes, uncomplicated: Secondary | ICD-10-CM | POA: Diagnosis not present

## 2017-05-28 DIAGNOSIS — R079 Chest pain, unspecified: Secondary | ICD-10-CM

## 2017-05-28 DIAGNOSIS — J189 Pneumonia, unspecified organism: Secondary | ICD-10-CM

## 2017-05-28 NOTE — ED Triage Notes (Signed)
Pt to triage win wheelchair. Pt c/o left sided chest pain with SOB x2 days. Pt has hx of strokes, last 2 years ago, pt is DM with hypertension. Pt had mouth extractions on Friday, all upper teeth removed on Friday.

## 2017-05-29 ENCOUNTER — Emergency Department: Payer: Medicare HMO

## 2017-05-29 LAB — BASIC METABOLIC PANEL
ANION GAP: 10 (ref 5–15)
BUN: 10 mg/dL (ref 6–20)
CO2: 25 mmol/L (ref 22–32)
Calcium: 8.9 mg/dL (ref 8.9–10.3)
Chloride: 101 mmol/L (ref 101–111)
Creatinine, Ser: 0.97 mg/dL (ref 0.61–1.24)
GFR calc Af Amer: >60 mL/min (ref >60)
GFR calc non Af Amer: >60 mL/min (ref >60)
GLUCOSE: 127 mg/dL — AB (ref 65–99)
POTASSIUM: 3.4 mmol/L — AB (ref 3.5–5.1)
Sodium: 136 mmol/L (ref 135–145)

## 2017-05-29 LAB — TROPONIN I
Troponin I: <0.03 ng/mL (ref <0.03)
Troponin I: <0.03 ng/mL (ref <0.03)

## 2017-05-29 LAB — CBC
HEMATOCRIT: 41.6 % (ref 40.0–52.0)
Hemoglobin: 15.2 g/dL (ref 13.0–18.0)
MCH: 33 pg (ref 26.0–34.0)
MCHC: 36.6 g/dL — ABNORMAL HIGH (ref 32.0–36.0)
MCV: 90.1 fL (ref 80.0–100.0)
Platelets: 249 10*3/uL (ref 150–440)
RBC: 4.61 MIL/uL (ref 4.40–5.90)
RDW: 14 % (ref 11.5–14.5)
WBC: 10.1 10*3/uL (ref 3.8–10.6)

## 2017-05-29 LAB — FIBRIN DERIVATIVES D-DIMER (ARMC ONLY): Fibrin derivatives D-dimer (ARMC): 329.25 (ref 0.00–499.00)

## 2017-05-29 MED ORDER — LEVOFLOXACIN IN D5W 750 MG/150ML IV SOLN
750.0000 mg | Freq: Once | INTRAVENOUS | Status: AC
  Administered 2017-05-29: 750 mg via INTRAVENOUS
  Filled 2017-05-29: qty 150

## 2017-05-29 MED ORDER — ASPIRIN 81 MG PO CHEW
324.0000 mg | CHEWABLE_TABLET | Freq: Once | ORAL | Status: AC
  Administered 2017-05-29: 324 mg via ORAL
  Filled 2017-05-29: qty 4

## 2017-05-29 MED ORDER — OXYCODONE HCL 5 MG PO TABS
10.0000 mg | ORAL_TABLET | ORAL | Status: AC
  Administered 2017-05-29: 10 mg via ORAL
  Filled 2017-05-29: qty 2

## 2017-05-29 MED ORDER — NITROGLYCERIN 0.4 MG SL SUBL
0.4000 mg | SUBLINGUAL_TABLET | SUBLINGUAL | Status: DC | PRN
  Administered 2017-05-29 (×2): 0.4 mg via SUBLINGUAL
  Filled 2017-05-29 (×3): qty 1

## 2017-05-29 MED ORDER — GI COCKTAIL ~~LOC~~
30.0000 mL | Freq: Once | ORAL | Status: AC
  Administered 2017-05-29: 30 mL via ORAL
  Filled 2017-05-29: qty 30

## 2017-05-29 MED ORDER — ACETAMINOPHEN 500 MG PO TABS
1000.0000 mg | ORAL_TABLET | Freq: Once | ORAL | Status: AC
  Administered 2017-05-29: 1000 mg via ORAL
  Filled 2017-05-29: qty 2

## 2017-05-29 MED ORDER — LEVOFLOXACIN 750 MG PO TABS: 750.0000 mg | ORAL_TABLET | Freq: Every day | ORAL | 0 refills | Status: AC

## 2017-05-29 NOTE — ED Notes (Signed)
Reviewed d/c instructions and prescription again with patient. Pt again verified understanding

## 2017-05-29 NOTE — ED Notes (Signed)
ED Provider at bedside. 

## 2017-05-29 NOTE — ED Notes (Signed)
Patient taken to xray.

## 2017-05-29 NOTE — ED Notes (Signed)
Patient is back from xray

## 2017-05-29 NOTE — ED Notes (Signed)
Reviewed d/c instructions, follow-up care, prescription with patient. Pt verbalized understanding.  

## 2017-05-29 NOTE — ED Provider Notes (Signed)
[facility] Emergency Department Provider Note   ____________________________________________   First MD Initiated Contact with Patient 05/29/17 0008     (approximate)  I have reviewed the triage vital signs and the nursing notes.   HISTORY  Chief Complaint Chest Pain    HPI Caleb [name] is a 41 y.o. male who comes into the hospital today with some chest pain and shortness of breath. He reports it started about a day or 2 ago. He reports that initially 2 days ago he was complaining of soreness in his chest. He reports that it seemed to get better but then it came back today. He's having pain radiating down his left arm as well as some shortness of breath and dizziness. The patient reports that he has had pain like this before and he does have a history of a pulmonary embolus. He reports that he was on blood thinners for 8 months and then they took him off. The patient had multiple teeth extractions recently and a catheterization about one to 2 years ago. The patient did not take anything for pain and rates his pain a 9 out of 10 in intensity. He reports that he has a developing ulcer from a colonoscopy which was done on the 18th but denies any nausea or vomiting. The patient is here today for evaluation of his symptoms.   Past Medical History:  Diagnosis Date  . Anxiety   . Borderline diabetes   . GERD (gastroesophageal reflux disease)   . History of degenerative disc disease   . Hypercholesteremia   . Hypertension   . Knee pain, bilateral   . PTSD (post-traumatic stress disorder)   . Stroke [facility]) 2015   mold stroke per patient    Patient Active Problem List   Diagnosis Date Noted  . Unstable angina ([place]) 01/24/2017  . Diverticulitis 02/26/2016  . Diverticulitis of large intestine without perforation or abscess without bleeding     Past Surgical History:  Procedure Laterality Date  . COLONOSCOPY WITH PROPOFOL N/A 05/07/2017   Procedure:  COLONOSCOPY WITH PROPOFOL;  Surgeon: Lollie Sails, MD;  Location: [facility] ENDOSCOPY;  Service: Endoscopy;  Laterality: N/A;  . ESOPHAGOGASTRODUODENOSCOPY (EGD) WITH PROPOFOL N/A 05/07/2017   Procedure: ESOPHAGOGASTRODUODENOSCOPY (EGD) WITH PROPOFOL;  Surgeon: Lollie Sails, MD;  Location: [facility] ENDOSCOPY;  Service: Endoscopy;  Laterality: N/A;  . HERNIA REPAIR    . Knee surgeries    . TONSILLECTOMY      Prior to Admission medications   Medication Sig Start Date End Date Taking? Authorizing Provider  aspirin EC 81 MG tablet Take 81 mg by mouth daily.    [provider]  atorvastatin (LIPITOR) 80 MG tablet Take 80 mg by mouth at bedtime.  03/04/15   [provider]  baclofen (LIORESAL) 10 MG tablet Take 10 mg by mouth 3 (three) times daily as needed. 12/11/16 12/11/17  [provider]  clotrimazole (MYCELEX) 10 MG troche Take 1 tablet (10 mg total) by mouth 5 (five) times daily. Patient not taking: Reported on 01/24/2017 07/18/16   Menshew, Dannielle Karvonen, PA-C  cyclobenzaprine (FLEXERIL) 10 MG tablet Take 1 tablet (10 mg total) by mouth 3 (three) times daily as needed for muscle spasms. Patient not taking: Reported on 05/07/2017 04/22/17   Sherrie George B, FNP  diazepam (VALIUM) 5 MG tablet Take 10 mg by mouth at bedtime.  01/14/15   [provider]  diclofenac sodium (VOLTAREN) 1 % GEL Apply 2 g topically  4 (four) times daily.    [provider]  Diphenhyd-Hydrocort-Nystatin (FIRST-DUKES MOUTHWASH) SUSP Use as directed 5 mLs in the mouth or throat 3 (three) times daily. Patient not taking: Reported on 05/07/2017 01/25/17   Max Sane, MD  diphenhydrAMINE (BENADRYL) 25 mg capsule Take 50 mg by mouth every 6 (six) hours as needed for itching or allergies.    [provider]  erythromycin ophthalmic ointment Place 1 application into the left eye 4 (four) times daily. Patient not taking: Reported on 05/07/2017 03/28/17   Little, Traci M, PA-C    esomeprazole (NEXIUM) 40 MG capsule Take 40 mg by mouth daily. 06/13/16 06/13/17  [provider]  gabapentin (NEURONTIN) 100 MG capsule Take 300 mg by mouth at bedtime. 08/24/15   [provider]  ketorolac (ACULAR) 0.5 % ophthalmic solution Place 1 drop into the left eye 4 (four) times daily. Patient not taking: Reported on 05/07/2017 03/28/17   Little, Traci M, PA-C  levofloxacin (LEVAQUIN) 750 MG tablet Take 1 tablet (750 mg total) by mouth daily. 05/29/17 06/08/17  Loney Hering, MD  losartan-hydrochlorothiazide (HYZAAR) 100-25 MG per tablet Take 1 tablet by mouth daily. 02/25/15   [provider]  methocarbamol (ROBAXIN-750) 750 MG tablet Take 1 tablet (750 mg total) by mouth every 8 (eight) hours as needed for muscle spasms. Patient not taking: Reported on 01/24/2017 10/18/15   Victorino Dike, FNP  metroNIDAZOLE (FLAGYL) 500 MG tablet Take 1 tablet (500 mg total) by mouth 2 (two) times daily. Patient not taking: Reported on 01/24/2017 02/25/16   Lisa Roca, MD  naproxen (NAPROSYN) 500 MG tablet Take 1 tablet (500 mg total) by mouth 2 (two) times daily with a meal. 04/22/17   Triplett, Cari B, FNP  ondansetron (ZOFRAN) 4 MG tablet Take 1 tablet (4 mg total) by mouth daily as needed for nausea or vomiting. 11/11/15   Orbie Pyo, MD  oxyCODONE (ROXICODONE) 15 MG immediate release tablet Take 15 mg by mouth 5 (five) times daily. For pain. 09/25/15   [provider]  pantoprazole (PROTONIX) 40 MG tablet Take 1 tablet by mouth daily. 12/15/14   [provider]  penicillin v potassium (VEETID) 500 MG tablet Take 1 tablet (500 mg total) by mouth 4 (four) times daily. Patient not taking: Reported on 05/07/2017 01/25/17   Max Sane, MD  pregabalin (LYRICA) 75 MG capsule Take 75 mg by mouth 3 (three) times daily as needed.  06/13/16 06/13/17  [provider]    Allergies Bee pollen; Contrast media [iodinated diagnostic agents]; Metformin and  related; Dilaudid [hydromorphone hcl]; and Lisinopril  Family History  Problem Relation Age of Onset  . Diabetes Mother   . Cancer Paternal Grandmother   . Cancer Paternal Grandfather     Social History Social History  Substance Use Topics  . Smoking status: Current Every Day Smoker    Packs/day: 1.00    Years: 20.00    Types: Cigarettes  . Smokeless tobacco: Never Used  . Alcohol use 2.4 oz/week    4 Cans of beer per week     Comment: occas.     Review of Systems  Constitutional: No fever/chills Eyes: No visual changes. ENT: No sore throat. Cardiovascular:  chest pain. Respiratory:  shortness of breath. Gastrointestinal: No abdominal pain.  No nausea, no vomiting.  No diarrhea.  No constipation. Genitourinary: Negative for dysuria. Musculoskeletal: Negative for back pain. Skin: Negative for rash. Neurological: Dizziness.   ____________________________________________   PHYSICAL EXAM:  VITAL SIGNS: ED Triage Vitals [05/28/17 2346]  Enc Vitals Group     BP (!) 201/109     Pulse Rate 63     Resp 18     Temp 98.8 F (37.1 C)     Temp Source Oral     SpO2 98 %     Weight 250 lb (113.4 kg)     Height      Head Circumference      Peak Flow      Pain Score      Pain Loc      Pain Edu?      Excl. in [place]?     Constitutional: Alert and oriented. Well appearing and in moderate distress. Eyes: Conjunctivae are normal. PERRL. EOMI. Head: Atraumatic. Nose: No congestion/rhinnorhea. Mouth/Throat: Mucous membranes are moist.  Oropharynx non-erythematous. Cardiovascular: Normal rate, regular rhythm. Grossly normal heart sounds.  Good peripheral circulation. Respiratory: Normal respiratory effort.  No retractions. Lungs CTAB. Gastrointestinal: Soft and nontender. No distention. Positive bowel sounds Musculoskeletal: No lower extremity tenderness nor edema.   Neurologic:  Normal speech and language.  Skin:  Skin is warm, dry and intact. Psychiatric: Mood and affect  are normal.   ____________________________________________   LABS (all labs ordered are listed, but only abnormal results are displayed)  Labs Reviewed  BASIC METABOLIC PANEL - Abnormal; Notable for the following:       Result Value   Potassium 3.4 (*)    Glucose, Bld 127 (*)    All other components within normal limits  CBC - Abnormal; Notable for the following:    MCHC 36.6 (*)    All other components within normal limits  TROPONIN I  FIBRIN DERIVATIVES D-DIMER ([facility] ONLY)  TROPONIN I   ____________________________________________  EKG  ED ECG REPORT I, Loney Hering, the attending physician, personally viewed and interpreted this ECG.   Date: 05/28/2017  EKG Time: 2344  Rate: 61  Rhythm: normal sinus rhythm  Axis: normal  Intervals:none  ST&T Change: none  ____________________________________________  RADIOLOGY  Dg Chest 2 View  Result Date: 05/29/2017 CLINICAL DATA:  Left-sided chest pain with shortness of breath EXAM: CHEST  2 VIEW COMPARISON:  01/24/2017 FINDINGS: Mild atelectasis or infiltrate at the lingula. No pleural effusion. Normal heart size. No pneumothorax. IMPRESSION: Mild lingular atelectasis or infiltrate Electronically Signed   By: Donavan Foil M.D.   On: 05/29/2017 00:44    ____________________________________________   PROCEDURES  Procedure(s) performed: None  Procedures  Critical Care performed: No  ____________________________________________   INITIAL IMPRESSION / ASSESSMENT AND PLAN / ED COURSE  Pertinent labs & imaging results that were available during my care of the patient were reviewed by me and considered in my medical decision making (see chart for details).  This is a 41 year old male who comes into the hospital today with some chest pain. The patient does have a significant medical history of PE and CVA. We will check some blood work I will give the patient some nitroglycerin as well as a dose of aspirin and a GI  cocktail. The patient will receive a chest x-ray. I will also add a d-dimer onto the patient's blood work and I will reassess the patient once I received the results of his blood work.     The patient's d-dimer is negative. The patient also had 2 troponins done that were negative. The patient's chest x-ray showed a lingular infiltrate which is concerning for pneumonia. This could explain the patient's chest  pain. I will give the patient a dose of levofloxacin as he did have recent dental work. The patient's vital signs though are unremarkable. His blood pressures improved to 153/99. I feel that the patient may be discharged home as he had no fevers no respiratory distress and no hypoxia. The patient should follow back up with his primary care physician for further evaluation as well as his cardiologist. The patient will be discharged home. ____________________________________________   FINAL CLINICAL IMPRESSION(S) / ED DIAGNOSES  Final diagnoses:  Chest pain, unspecified type  Community acquired pneumonia of left lower lobe of lung ([place])      NEW MEDICATIONS STARTED DURING THIS VISIT:  New Prescriptions   LEVOFLOXACIN (LEVAQUIN) 750 MG TABLET    Take 1 tablet (750 mg total) by mouth daily.     Note:  This document was prepared using Dragon voice recognition software and may include unintentional dictation errors.    Loney Hering, MD 05/29/17 [phone]

## 2017-05-29 NOTE — Discharge Instructions (Signed)
Please follow up with your primary care physician.

## 2017-05-29 NOTE — ED Notes (Signed)
Patient denies allergy to dilaudid

## 2017-05-29 NOTE — ED Notes (Signed)
3rd Nitro SL held per Dr. Dahlia Client.

## 2017-07-01 ENCOUNTER — Emergency Department: Payer: Medicare HMO

## 2017-07-01 ENCOUNTER — Encounter: Payer: Self-pay | Admitting: Emergency Medicine

## 2017-07-01 ENCOUNTER — Emergency Department
Admission: EM | Admit: 2017-07-01 | Discharge: 2017-07-01 | Disposition: A | Discharge status: Home / Self Care | Payer: Medicare HMO | Attending: Student in an Organized Health Care Education/Training Program | Admitting: Student in an Organized Health Care Education/Training Program

## 2017-07-01 DIAGNOSIS — R109 Unspecified abdominal pain: Secondary | ICD-10-CM | POA: Diagnosis present

## 2017-07-01 DIAGNOSIS — I10 Essential (primary) hypertension: Secondary | ICD-10-CM | POA: Insufficient documentation

## 2017-07-01 DIAGNOSIS — R1011 Right upper quadrant pain: Secondary | ICD-10-CM

## 2017-07-01 DIAGNOSIS — R1031 Right lower quadrant pain: Secondary | ICD-10-CM | POA: Diagnosis not present

## 2017-07-01 DIAGNOSIS — Z7982 Long term (current) use of aspirin: Secondary | ICD-10-CM | POA: Insufficient documentation

## 2017-07-01 DIAGNOSIS — Z8673 Personal history of transient ischemic attack (TIA), and cerebral infarction without residual deficits: Secondary | ICD-10-CM | POA: Insufficient documentation

## 2017-07-01 DIAGNOSIS — F17210 Nicotine dependence, cigarettes, uncomplicated: Secondary | ICD-10-CM | POA: Diagnosis not present

## 2017-07-01 DIAGNOSIS — Z79899 Other long term (current) drug therapy: Secondary | ICD-10-CM | POA: Insufficient documentation

## 2017-07-01 LAB — URINALYSIS, COMPLETE (UACMP) WITH MICROSCOPIC
BILIRUBIN URINE: NEGATIVE
Bacteria, UA: NONE SEEN
Glucose, UA: NEGATIVE mg/dL
Hgb urine dipstick: NEGATIVE
KETONES UR: NEGATIVE mg/dL
LEUKOCYTES UA: NEGATIVE
Nitrite: NEGATIVE
PH: 8 (ref 5.0–8.0)
PROTEIN: 30 mg/dL — AB
SQUAMOUS EPITHELIAL / LPF: NONE SEEN
Specific Gravity, Urine: 1.021 (ref 1.005–1.030)

## 2017-07-01 LAB — CBC
HCT: 46.4 % (ref 40.0–52.0)
Hemoglobin: 16.3 g/dL (ref 13.0–18.0)
MCH: 31.9 pg (ref 26.0–34.0)
MCHC: 35.1 g/dL (ref 32.0–36.0)
MCV: 90.8 fL (ref 80.0–100.0)
PLATELETS: 301 10*3/uL (ref 150–440)
RBC: 5.11 MIL/uL (ref 4.40–5.90)
RDW: 14.1 % (ref 11.5–14.5)
WBC: 12 10*3/uL — AB (ref 3.8–10.6)

## 2017-07-01 LAB — COMPREHENSIVE METABOLIC PANEL
ALBUMIN: 4.3 g/dL (ref 3.5–5.0)
ALT: 89 U/L — ABNORMAL HIGH (ref 17–63)
AST: 51 U/L — ABNORMAL HIGH (ref 15–41)
Alkaline Phosphatase: 114 U/L (ref 38–126)
Anion gap: 11 (ref 5–15)
BUN: 8 mg/dL (ref 6–20)
CALCIUM: 9.7 mg/dL (ref 8.9–10.3)
CHLORIDE: 103 mmol/L (ref 101–111)
CO2: 27 mmol/L (ref 22–32)
Creatinine, Ser: 0.91 mg/dL (ref 0.61–1.24)
GFR calc Af Amer: >60 mL/min (ref >60)
GFR calc non Af Amer: >60 mL/min (ref >60)
GLUCOSE: 133 mg/dL — AB (ref 65–99)
POTASSIUM: 3.6 mmol/L (ref 3.5–5.1)
SODIUM: 141 mmol/L (ref 135–145)
Total Bilirubin: 0.7 mg/dL (ref 0.3–1.2)
Total Protein: 7.4 g/dL (ref 6.5–8.1)

## 2017-07-01 LAB — LIPASE, BLOOD: LIPASE: 22 U/L (ref 11–51)

## 2017-07-01 MED ORDER — HALOPERIDOL LACTATE 5 MG/ML IJ SOLN
5.0000 mg | Freq: Once | INTRAMUSCULAR | Status: DC
Start: 2017-07-01 — End: 2017-07-01
  Filled 2017-07-01: qty 1

## 2017-07-01 MED ORDER — MORPHINE SULFATE (PF) 4 MG/ML IV SOLN
4.0000 mg | INTRAVENOUS | Status: DC | PRN
  Administered 2017-07-01: 4 mg via INTRAVENOUS
  Filled 2017-07-01: qty 1

## 2017-07-01 MED ORDER — PROMETHAZINE HCL 25 MG/ML IJ SOLN
12.5000 mg | Freq: Four times a day (QID) | INTRAMUSCULAR | Status: DC | PRN
  Administered 2017-07-01: 12.5 mg via INTRAVENOUS
  Filled 2017-07-01: qty 1

## 2017-07-01 MED ORDER — SODIUM CHLORIDE 0.9 % IV BOLUS (SEPSIS): 1000.0000 mL | Freq: Once | INTRAVENOUS | Status: DC

## 2017-07-01 MED ORDER — HALOPERIDOL LACTATE 5 MG/ML IJ SOLN
5.0000 mg | Freq: Once | INTRAMUSCULAR | Status: AC
  Administered 2017-07-01: 5 mg via INTRAVENOUS
  Filled 2017-07-01: qty 1

## 2017-07-01 NOTE — ED Triage Notes (Signed)
abd pain x 3 days. States took mag citrate 3 days ago with results then got worse again. Tried mag citrate today with only small amt of stool. Hx of diverticulitis. Had colonoscopy June 18th with polyps removed.

## 2017-07-01 NOTE — Discharge Instructions (Signed)

## 2017-07-01 NOTE — ED Provider Notes (Signed)
[facility] Emergency Department Provider Note    First MD Initiated Contact with Patient 07/01/17 1538     (approximate)  I have reviewed the triage vital signs and the nursing notes.   HISTORY  Chief Complaint Abdominal Pain    HPI Caleb [name] is a 41 y.o. male with anxiety PTSD GERD chronic constipation presents with intermittent right-sided abdominal pain for the past 3 days. Patient did get some relief with mag citrate but states the pain came back today is 10 out of 10 in severity and feels like something is about to explode. He has not had any fevers. No flank pain. No shortness of breath or chest pain. No dysuria. No hematuria.  Pain is nonradiating. Has had some mild nausea but no vomiting.   Past Medical History:  Diagnosis Date  . Anxiety   . Borderline diabetes   . GERD (gastroesophageal reflux disease)   . History of degenerative disc disease   . Hypercholesteremia   . Hypertension   . Knee pain, bilateral   . PTSD (post-traumatic stress disorder)   . Stroke [facility]) 2015   mold stroke per patient   Family History  Problem Relation Age of Onset  . Diabetes Mother   . Cancer Paternal Grandmother   . Cancer Paternal Grandfather    Past Surgical History:  Procedure Laterality Date  . COLONOSCOPY WITH PROPOFOL N/A 05/07/2017   Procedure: COLONOSCOPY WITH PROPOFOL;  Surgeon: Lollie Sails, MD;  Location: [facility] ENDOSCOPY;  Service: Endoscopy;  Laterality: N/A;  . ESOPHAGOGASTRODUODENOSCOPY (EGD) WITH PROPOFOL N/A 05/07/2017   Procedure: ESOPHAGOGASTRODUODENOSCOPY (EGD) WITH PROPOFOL;  Surgeon: Lollie Sails, MD;  Location: [facility] ENDOSCOPY;  Service: Endoscopy;  Laterality: N/A;  . HERNIA REPAIR    . Knee surgeries    . TONSILLECTOMY     Patient Active Problem List   Diagnosis Date Noted  . Unstable angina ([place]) 01/24/2017  . Diverticulitis 02/26/2016  . Diverticulitis of large intestine without perforation or abscess  without bleeding       Prior to Admission medications   Medication Sig Start Date End Date Taking? Authorizing Provider  aspirin EC 81 MG tablet Take 81 mg by mouth daily.    [provider]  atorvastatin (LIPITOR) 80 MG tablet Take 80 mg by mouth at bedtime.  03/04/15   [provider]  baclofen (LIORESAL) 10 MG tablet Take 10 mg by mouth 3 (three) times daily as needed. 12/11/16 12/11/17  [provider]  clotrimazole (MYCELEX) 10 MG troche Take 1 tablet (10 mg total) by mouth 5 (five) times daily. Patient not taking: Reported on 01/24/2017 07/18/16   Menshew, Dannielle Karvonen, PA-C  cyclobenzaprine (FLEXERIL) 10 MG tablet Take 1 tablet (10 mg total) by mouth 3 (three) times daily as needed for muscle spasms. Patient not taking: Reported on 05/07/2017 04/22/17   Sherrie George B, FNP  diazepam (VALIUM) 5 MG tablet Take 10 mg by mouth at bedtime.  01/14/15   [provider]  diclofenac sodium (VOLTAREN) 1 % GEL Apply 2 g topically 4 (four) times daily.    [provider]  Diphenhyd-Hydrocort-Nystatin (FIRST-DUKES MOUTHWASH) SUSP Use as directed 5 mLs in the mouth or throat 3 (three) times daily. Patient not taking: Reported on 05/07/2017 01/25/17   Max Sane, MD  diphenhydrAMINE (BENADRYL) 25 mg capsule Take 50 mg by mouth every 6 (six) hours as needed for itching or allergies.    [provider]  erythromycin ophthalmic ointment  Place 1 application into the left eye 4 (four) times daily. Patient not taking: Reported on 05/07/2017 03/28/17   Little, Traci M, PA-C  esomeprazole (NEXIUM) 40 MG capsule Take 40 mg by mouth daily. 06/13/16 06/13/17  [provider]  gabapentin (NEURONTIN) 100 MG capsule Take 300 mg by mouth at bedtime. 08/24/15   [provider]  ketorolac (ACULAR) 0.5 % ophthalmic solution Place 1 drop into the left eye 4 (four) times daily. Patient not taking: Reported on 05/07/2017 03/28/17   Little, Traci M, PA-C    losartan-hydrochlorothiazide (HYZAAR) 100-25 MG per tablet Take 1 tablet by mouth daily. 02/25/15   [provider]  methocarbamol (ROBAXIN-750) 750 MG tablet Take 1 tablet (750 mg total) by mouth every 8 (eight) hours as needed for muscle spasms. Patient not taking: Reported on 01/24/2017 10/18/15   Victorino Dike, FNP  metroNIDAZOLE (FLAGYL) 500 MG tablet Take 1 tablet (500 mg total) by mouth 2 (two) times daily. Patient not taking: Reported on 01/24/2017 02/25/16   Lisa Roca, MD  naproxen (NAPROSYN) 500 MG tablet Take 1 tablet (500 mg total) by mouth 2 (two) times daily with a meal. 04/22/17   Triplett, Cari B, FNP  ondansetron (ZOFRAN) 4 MG tablet Take 1 tablet (4 mg total) by mouth daily as needed for nausea or vomiting. 11/11/15   Orbie Pyo, MD  oxyCODONE (ROXICODONE) 15 MG immediate release tablet Take 15 mg by mouth 5 (five) times daily. For pain. 09/25/15   [provider]  pantoprazole (PROTONIX) 40 MG tablet Take 1 tablet by mouth daily. 12/15/14   [provider]  penicillin v potassium (VEETID) 500 MG tablet Take 1 tablet (500 mg total) by mouth 4 (four) times daily. Patient not taking: Reported on 05/07/2017 01/25/17   Max Sane, MD  pregabalin (LYRICA) 75 MG capsule Take 75 mg by mouth 3 (three) times daily as needed.  06/13/16 06/13/17  [provider]    Allergies Bee pollen; Contrast media [iodinated diagnostic agents]; Metformin and related; Dilaudid [hydromorphone hcl]; and Lisinopril    Social History Social History  Substance Use Topics  . Smoking status: Current Every Day Smoker    Packs/day: 1.00    Years: 20.00    Types: Cigarettes  . Smokeless tobacco: Never Used  . Alcohol use 2.4 oz/week    4 Cans of beer per week     Comment: occas.     Review of Systems Patient denies headaches, rhinorrhea, blurry vision, numbness, shortness of breath, chest pain, edema, cough, abdominal pain, nausea, vomiting, diarrhea,  dysuria, fevers, rashes or hallucinations unless otherwise stated above in HPI. ____________________________________________   PHYSICAL EXAM:  VITAL SIGNS: Vitals:   07/01/17 1509  BP: (!) 140/100  Pulse: 95  Resp: 20  Temp: 97.9 F (36.6 C)  SpO2: 99%    Constitutional: Alert and oriented. Well appearing and in no acute distress. Eyes: Conjunctivae are normal.  Head: Atraumatic. Nose: No congestion/rhinnorhea. Mouth/Throat: Mucous membranes are moist.   Neck: No stridor. Painless ROM.  Cardiovascular: Normal rate, regular rhythm. Grossly normal heart sounds.  Good peripheral circulation. Respiratory: Normal respiratory effort.  No retractions. Lungs CTAB. Gastrointestinal: Soft with mild ttp in ruq and rlq. No distention. No abdominal bruits. No CVA tenderness. Genitourinary:  Musculoskeletal: No lower extremity tenderness nor edema.  No joint effusions. Neurologic:  Normal speech and language. No gross focal neurologic deficits are appreciated. No facial droop Skin:  Skin is warm, dry and intact. No rash noted.  Psychiatric: Mood and affect are normal. Speech and behavior are normal.  ____________________________________________   LABS (all labs ordered are listed, but only abnormal results are displayed)  Results for orders placed or performed during the hospital encounter of 07/01/17 (from the past 24 hour(s))  Lipase, blood     Status: None   Collection Time: 07/01/17  3:30 PM  Result Value Ref Range   Lipase 22 11 - 51 U/L  Comprehensive metabolic panel     Status: Abnormal   Collection Time: 07/01/17  3:30 PM  Result Value Ref Range   Sodium 141 135 - 145 mmol/L   Potassium 3.6 3.5 - 5.1 mmol/L   Chloride 103 101 - 111 mmol/L   CO2 27 22 - 32 mmol/L   Glucose, Bld 133 (H) 65 - 99 mg/dL   BUN 8 6 - 20 mg/dL   Creatinine, Ser 0.91 0.61 - 1.24 mg/dL   Calcium 9.7 8.9 - 10.3 mg/dL   Total Protein 7.4 6.5 - 8.1 g/dL   Albumin 4.3 3.5 - 5.0 g/dL   AST 51 (H) 15  - 41 U/L   ALT 89 (H) 17 - 63 U/L   Alkaline Phosphatase 114 38 - 126 U/L   Total Bilirubin 0.7 0.3 - 1.2 mg/dL   GFR calc non Af Amer >60 >60 mL/min   GFR calc Af Amer >60 >60 mL/min   Anion gap 11 5 - 15  CBC     Status: Abnormal   Collection Time: 07/01/17  3:30 PM  Result Value Ref Range   WBC 12.0 (H) 3.8 - 10.6 K/uL   RBC 5.11 4.40 - 5.90 MIL/uL   Hemoglobin 16.3 13.0 - 18.0 g/dL   HCT 46.4 40.0 - 52.0 %   MCV 90.8 80.0 - 100.0 fL   MCH 31.9 26.0 - 34.0 pg   MCHC 35.1 32.0 - 36.0 g/dL   RDW 14.1 11.5 - 14.5 %   Platelets 301 150 - 440 K/uL  Urinalysis, Complete w Microscopic     Status: Abnormal   Collection Time: 07/01/17  6:59 PM  Result Value Ref Range   Color, Urine YELLOW (A) YELLOW   APPearance CLEAR (A) CLEAR   Specific Gravity, Urine 1.021 1.005 - 1.030   pH 8.0 5.0 - 8.0   Glucose, UA NEGATIVE NEGATIVE mg/dL   Hgb urine dipstick NEGATIVE NEGATIVE   Bilirubin Urine NEGATIVE NEGATIVE   Ketones, ur NEGATIVE NEGATIVE mg/dL   Protein, ur 30 (A) NEGATIVE mg/dL   Nitrite NEGATIVE NEGATIVE   Leukocytes, UA NEGATIVE NEGATIVE   RBC / HPF 0-5 0 - 5 RBC/hpf   WBC, UA 0-5 0 - 5 WBC/hpf   Bacteria, UA NONE SEEN NONE SEEN   Squamous Epithelial / LPF NONE SEEN NONE SEEN   Mucous PRESENT    ____________________________________________ ____________________________________________  RADIOLOGY  I personally reviewed all radiographic images ordered to evaluate for the above acute complaints and reviewed radiology reports and findings.  These findings were personally discussed with the patient.  Please see medical record for radiology report.  ____________________________________________   PROCEDURES  Procedure(s) performed:  Procedures    Critical Care performed: no ____________________________________________   INITIAL IMPRESSION / ASSESSMENT AND PLAN / ED COURSE  Pertinent labs & imaging results that were available during my care of the patient were reviewed  by me and considered in my medical decision making (see chart for details).  DDX: colitis, diverticulitis, appy, stones, mask strain, constipation  Caleb [name] is a  41 y.o. who presents to the ED with Right-sided abdominal pain as described above. Patient uncomfortable but nontoxic appearing. No acute peritonitis to suggest appendicitis or acute cholecystitis but with some blood work and order radiographic testing for the above differential. We'll provide IV pain medication.  Clinical Course as of Jul 01 1925  Sun Jul 01, 2017  1754 CT imaging is reassuring however given his mild leukocytosis and mild elevation of LFTs will order ultrasound of right upper quadrant to further evaluate for any evidence of cholecystitis. There is no evidence of acute appendicitis.  [PR]    Clinical Course User Index [PR] Merlyn Lot, MD   ----------------------------------------- 7:27 PM on 07/01/2017 -----------------------------------------  Repeat abdominal exam soft and benign. Right upper quadrant ultrasound shows chronic steatohepatitis but no evidence of cholecystitis or cholelithiasis. Patient currently requesting discharge home. Based on lack of acute findings at this point, I do feel it is appropriate for the patient be discharged home for further work up as an outpatient.  ____________________________________________   FINAL CLINICAL IMPRESSION(S) / ED DIAGNOSES  Final diagnoses:  RUQ pain  Right lower quadrant abdominal pain      NEW MEDICATIONS STARTED DURING THIS VISIT:  New Prescriptions   No medications on file     Note:  This document was prepared using Dragon voice recognition software and may include unintentional dictation errors.    Merlyn Lot, MD 07/01/17 Kathyrn Drown

## 2017-10-16 DIAGNOSIS — R2990 Unspecified symptoms and signs involving the nervous system: Secondary | ICD-10-CM | POA: Insufficient documentation

## 2018-04-02 ENCOUNTER — Other Ambulatory Visit: Payer: Self-pay

## 2018-04-02 ENCOUNTER — Encounter: Payer: Self-pay | Admitting: Emergency Medicine

## 2018-04-02 ENCOUNTER — Emergency Department: Payer: Medicare HMO

## 2018-04-02 ENCOUNTER — Emergency Department
Admission: EM | Admit: 2018-04-02 | Discharge: 2018-04-02 | Disposition: A | Discharge status: Home / Self Care | Payer: Medicare HMO | Attending: Emergency Medicine | Admitting: Emergency Medicine

## 2018-04-02 DIAGNOSIS — F17210 Nicotine dependence, cigarettes, uncomplicated: Secondary | ICD-10-CM | POA: Insufficient documentation

## 2018-04-02 DIAGNOSIS — R0789 Other chest pain: Secondary | ICD-10-CM | POA: Insufficient documentation

## 2018-04-02 DIAGNOSIS — Z8673 Personal history of transient ischemic attack (TIA), and cerebral infarction without residual deficits: Secondary | ICD-10-CM | POA: Insufficient documentation

## 2018-04-02 DIAGNOSIS — Z79899 Other long term (current) drug therapy: Secondary | ICD-10-CM | POA: Insufficient documentation

## 2018-04-02 DIAGNOSIS — I10 Essential (primary) hypertension: Secondary | ICD-10-CM | POA: Diagnosis not present

## 2018-04-02 DIAGNOSIS — R079 Chest pain, unspecified: Secondary | ICD-10-CM

## 2018-04-02 DIAGNOSIS — Z7982 Long term (current) use of aspirin: Secondary | ICD-10-CM | POA: Diagnosis not present

## 2018-04-02 LAB — CBC WITH DIFFERENTIAL/PLATELET
Basophils Absolute: 0 10*3/uL (ref 0–0.1)
Basophils Relative: 0 %
EOS PCT: 4 %
Eosinophils Absolute: 0.5 10*3/uL (ref 0–0.7)
HCT: 48.8 % (ref 40.0–52.0)
Hemoglobin: 17.2 g/dL (ref 13.0–18.0)
LYMPHS ABS: 3.1 10*3/uL (ref 1.0–3.6)
LYMPHS PCT: 25 %
MCH: 31.5 pg (ref 26.0–34.0)
MCHC: 35.3 g/dL (ref 32.0–36.0)
MCV: 89.1 fL (ref 80.0–100.0)
MONO ABS: 0.5 10*3/uL (ref 0.2–1.0)
MONOS PCT: 4 %
Neutro Abs: 8.2 10*3/uL — ABNORMAL HIGH (ref 1.4–6.5)
Neutrophils Relative %: 67 %
PLATELETS: 297 10*3/uL (ref 150–440)
RBC: 5.48 MIL/uL (ref 4.40–5.90)
RDW: 14.6 % — AB (ref 11.5–14.5)
WBC: 12.2 10*3/uL — ABNORMAL HIGH (ref 3.8–10.6)

## 2018-04-02 LAB — COMPREHENSIVE METABOLIC PANEL
ALT: 34 U/L (ref 17–63)
AST: 24 U/L (ref 15–41)
Albumin: 4.6 g/dL (ref 3.5–5.0)
Alkaline Phosphatase: 103 U/L (ref 38–126)
Anion gap: 8 (ref 5–15)
BILIRUBIN TOTAL: 0.9 mg/dL (ref 0.3–1.2)
BUN: 8 mg/dL (ref 6–20)
CHLORIDE: 104 mmol/L (ref 101–111)
CO2: 23 mmol/L (ref 22–32)
CREATININE: 0.64 mg/dL (ref 0.61–1.24)
Calcium: 9.4 mg/dL (ref 8.9–10.3)
Glucose, Bld: 131 mg/dL — ABNORMAL HIGH (ref 65–99)
POTASSIUM: 3.5 mmol/L (ref 3.5–5.1)
Sodium: 135 mmol/L (ref 135–145)
TOTAL PROTEIN: 7.9 g/dL (ref 6.5–8.1)

## 2018-04-02 LAB — LIPASE, BLOOD: LIPASE: 32 U/L (ref 11–51)

## 2018-04-02 LAB — TROPONIN I
Troponin I: <0.03 ng/mL (ref <0.03)
Troponin I: <0.03 ng/mL (ref <0.03)

## 2018-04-02 LAB — FIBRIN DERIVATIVES D-DIMER (ARMC ONLY): FIBRIN DERIVATIVES D-DIMER (ARMC): 306.93 ng{FEU}/mL (ref 0.00–499.00)

## 2018-04-02 MED ORDER — ONDANSETRON HCL 4 MG/2ML IJ SOLN
INTRAMUSCULAR | Status: AC
  Administered 2018-04-02: 4 mg via INTRAVENOUS
  Filled 2018-04-02: qty 2

## 2018-04-02 MED ORDER — ACETAMINOPHEN 500 MG PO TABS: 1000.0000 mg | ORAL_TABLET | Freq: Once | ORAL | Status: DC

## 2018-04-02 MED ORDER — MORPHINE SULFATE (PF) 4 MG/ML IV SOLN
4.0000 mg | Freq: Once | INTRAVENOUS | Status: AC
  Administered 2018-04-02: 4 mg via INTRAVENOUS
  Filled 2018-04-02: qty 1

## 2018-04-02 MED ORDER — ONDANSETRON HCL 4 MG/2ML IJ SOLN
4.0000 mg | Freq: Once | INTRAMUSCULAR | Status: AC
  Administered 2018-04-02: 4 mg via INTRAVENOUS

## 2018-04-02 MED ORDER — MORPHINE SULFATE (PF) 4 MG/ML IV SOLN
INTRAVENOUS | Status: AC
  Filled 2018-04-02: qty 1

## 2018-04-02 MED ORDER — NITROGLYCERIN 0.4 MG SL SUBL
0.4000 mg | SUBLINGUAL_TABLET | SUBLINGUAL | Status: DC | PRN
  Administered 2018-04-02 (×3): 0.4 mg via SUBLINGUAL
  Filled 2018-04-02 (×2): qty 1

## 2018-04-02 NOTE — Progress Notes (Signed)
NTG spray used one time via EMS

## 2018-04-02 NOTE — ED Notes (Signed)
Pt resting quietly in bed, wife at the bedside. Pt requested light be turned off. Call bell in reach, will continue to assess.

## 2018-04-02 NOTE — ED Triage Notes (Signed)
Pt here with c/o left sided cp that began this am, radiating to left arm/shoulder. Per EMS, left hand weakness also. C/o headache and nausea, appears anxious.

## 2018-04-02 NOTE — ED Notes (Signed)
Pt states cp has decreased to a 6 on pain scale, however, headache at a "10." Will discuss with Dr Kerman Passey.

## 2018-04-02 NOTE — Discharge Instructions (Addendum)
You have been seen in the emergency department today for chest pain. Your workup has shown normal results. As we discussed please follow-up with your primary care physician in the next 1-2 days for recheck. Return to the emergency department for any further chest pain, trouble breathing, or any other symptom personally concerning to yourself. °

## 2018-04-02 NOTE — ED Provider Notes (Signed)
[facility] Emergency Department Provider Note  Time seen: 10:43 AM  I have reviewed the triage vital signs and the nursing notes.   HISTORY  Chief Complaint Chest Pain    HPI Caleb [name] is a 42 y.o. male with a past medical history of anxiety, gastric reflux, hypertension, hyperlipidemia, PTSD, CVA, chronic pain on a pain contract, presents to the emergency department for chest pain.  According to the patient around 7:00 this morning he woke with 8 out of 10 central chest pain with pressure and tightness radiating from the chest into the left arm.  Denies any shortness of breath, pleuritic pain, cough, congestion, fever, leg pain or swelling besides chronic knee pain.  Patient states he had a stroke several years ago requiring TPA but has no deficits from the stroke.  Patient also states they thought he had a heart attack/blockage 2 to 3 years ago but had a cardiac catheterization which was normal per patient.  Currently patient is in mild distress due to chest pain, moderately hypertensive.  States the chest pain reduced from an 8/10 to a 7/10 after nitroglycerin but is now back to an 8/10 tightness/pressure to the center of his chest.   Past Medical History:  Diagnosis Date  . Anxiety   . Borderline diabetes   . GERD (gastroesophageal reflux disease)   . History of degenerative disc disease   . Hypercholesteremia   . Hypertension   . Knee pain, bilateral   . PTSD (post-traumatic stress disorder)   . Stroke [facility]) 2015   mold stroke per patient    Patient Active Problem List   Diagnosis Date Noted  . Unstable angina ([place]) 01/24/2017  . Diverticulitis 02/26/2016  . Diverticulitis of large intestine without perforation or abscess without bleeding     Past Surgical History:  Procedure Laterality Date  . COLONOSCOPY WITH PROPOFOL N/A 05/07/2017   Procedure: COLONOSCOPY WITH PROPOFOL;  Surgeon: Lollie Sails, MD;  Location: [facility] ENDOSCOPY;   Service: Endoscopy;  Laterality: N/A;  . ESOPHAGOGASTRODUODENOSCOPY (EGD) WITH PROPOFOL N/A 05/07/2017   Procedure: ESOPHAGOGASTRODUODENOSCOPY (EGD) WITH PROPOFOL;  Surgeon: Lollie Sails, MD;  Location: [facility] ENDOSCOPY;  Service: Endoscopy;  Laterality: N/A;  . HERNIA REPAIR    . Knee surgeries    . TONSILLECTOMY      Prior to Admission medications   Medication Sig Start Date End Date Taking? Authorizing Provider  aspirin EC 81 MG tablet Take 81 mg by mouth daily.    [provider]  atorvastatin (LIPITOR) 80 MG tablet Take 80 mg by mouth at bedtime.  03/04/15   [provider]  clotrimazole (MYCELEX) 10 MG troche Take 1 tablet (10 mg total) by mouth 5 (five) times daily. Patient not taking: Reported on 01/24/2017 07/18/16   Menshew, Dannielle Karvonen, PA-C  cyclobenzaprine (FLEXERIL) 10 MG tablet Take 1 tablet (10 mg total) by mouth 3 (three) times daily as needed for muscle spasms. Patient not taking: Reported on 05/07/2017 04/22/17   Sherrie George B, FNP  diazepam (VALIUM) 5 MG tablet Take 10 mg by mouth at bedtime.  01/14/15   [provider]  diclofenac sodium (VOLTAREN) 1 % GEL Apply 2 g topically 4 (four) times daily.    [provider]  Diphenhyd-Hydrocort-Nystatin (FIRST-DUKES MOUTHWASH) SUSP Use as directed 5 mLs in the mouth or throat 3 (three) times daily. Patient not taking: Reported on 05/07/2017 01/25/17   Max Sane, MD  diphenhydrAMINE (BENADRYL) 25 mg capsule Take 50 mg  by mouth every 6 (six) hours as needed for itching or allergies.    [provider]  erythromycin ophthalmic ointment Place 1 application into the left eye 4 (four) times daily. Patient not taking: Reported on 05/07/2017 03/28/17   Little, Traci M, PA-C  esomeprazole (NEXIUM) 40 MG capsule Take 40 mg by mouth daily. 06/13/16 06/13/17  [provider]  gabapentin (NEURONTIN) 100 MG capsule Take 300 mg by mouth at bedtime. 08/24/15   [provider]  ketorolac  (ACULAR) 0.5 % ophthalmic solution Place 1 drop into the left eye 4 (four) times daily. Patient not taking: Reported on 05/07/2017 03/28/17   Little, Traci M, PA-C  losartan-hydrochlorothiazide (HYZAAR) 100-25 MG per tablet Take 1 tablet by mouth daily. 02/25/15   [provider]  methocarbamol (ROBAXIN-750) 750 MG tablet Take 1 tablet (750 mg total) by mouth every 8 (eight) hours as needed for muscle spasms. Patient not taking: Reported on 01/24/2017 10/18/15   Victorino Dike, FNP  metroNIDAZOLE (FLAGYL) 500 MG tablet Take 1 tablet (500 mg total) by mouth 2 (two) times daily. Patient not taking: Reported on 01/24/2017 02/25/16   Lisa Roca, MD  naproxen (NAPROSYN) 500 MG tablet Take 1 tablet (500 mg total) by mouth 2 (two) times daily with a meal. 04/22/17   Triplett, Cari B, FNP  ondansetron (ZOFRAN) 4 MG tablet Take 1 tablet (4 mg total) by mouth daily as needed for nausea or vomiting. 11/11/15   Orbie Pyo, MD  oxyCODONE (ROXICODONE) 15 MG immediate release tablet Take 15 mg by mouth 5 (five) times daily. For pain. 09/25/15   [provider]  pantoprazole (PROTONIX) 40 MG tablet Take 1 tablet by mouth daily. 12/15/14   [provider]  penicillin v potassium (VEETID) 500 MG tablet Take 1 tablet (500 mg total) by mouth 4 (four) times daily. Patient not taking: Reported on 05/07/2017 01/25/17   Max Sane, MD  pregabalin (LYRICA) 75 MG capsule Take 75 mg by mouth 3 (three) times daily as needed.  06/13/16 06/13/17  [provider]    Allergies  Allergen Reactions  . Bee Pollen Anaphylaxis  . Contrast Media [Iodinated Diagnostic Agents] Anaphylaxis  . Metformin And Related Anaphylaxis    Unknown   . Dilaudid [Hydromorphone Hcl]   . Lisinopril Swelling    Family History  Problem Relation Age of Onset  . Diabetes Mother   . Cancer Paternal Grandmother   . Cancer Paternal Grandfather     Social History Social History   Tobacco Use  . Smoking  status: Current Every Day Smoker    Packs/day: 1.00    Years: 20.00    Pack years: 20.00    Types: Cigarettes  . Smokeless tobacco: Never Used  Substance Use Topics  . Alcohol use: Yes    Alcohol/week: 2.4 oz    Types: 4 Cans of beer per week    Comment: occas.   . Drug use: No    Review of Systems Constitutional: Negative for fever. Eyes: Negative for visual complaints ENT: Negative for recent illness/congestion Cardiovascular: Central chest pain with left arm radiation. Respiratory: Negative for shortness of breath. Gastrointestinal: Negative for abdominal pain, vomiting Genitourinary: Negative for urinary compaints Musculoskeletal: Negative for leg swelling or pain besides chronic knee pain. Skin: Negative for skin complaints  Neurological: States moderate headache. All other ROS negative  ____________________________________________   PHYSICAL EXAM:  VITAL SIGNS: ED Triage Vitals  Enc Vitals Group     BP 04/02/18 1041 (!)  170/111     Pulse Rate 04/02/18 1041 79     Resp 04/02/18 1041 16     Temp 04/02/18 1041 97.9 F (36.6 C)     Temp Source 04/02/18 1041 Oral     SpO2 04/02/18 1041 98 %     Weight 04/02/18 1040 240 lb (108.9 kg)     Height 04/02/18 1040 6\' 1"  (1.854 m)     Head Circumference --      Peak Flow --      Pain Score 04/02/18 1040 8     Pain Loc --      Pain Edu? --      Excl. in [place]? --     Constitutional: Alert and oriented.  Mild distress due to chest discomfort. Eyes: Normal exam ENT   Head: Normocephalic and atraumatic.   Nose: No congestion/rhinnorhea.   Mouth/Throat: Mucous membranes are moist. Cardiovascular: Normal rate, regular rhythm. No murmurs, rubs, or gallops. Respiratory: Normal respiratory effort without tachypnea nor retractions. Breath sounds are clear  Gastrointestinal: Soft and nontender. No distention. Musculoskeletal: Nontender with normal range of motion in all extremities. No lower extremity tenderness or  edema. Neurologic:  Normal speech and language. No gross focal neurologic deficits Skin:  Skin is warm, dry and intact.  Psychiatric: Mood and affect are normal.   ____________________________________________    EKG EKG viewed and interpreted by myself shows normal sinus rhythm at 76 bpm with a narrow QRS, normal axis, normal intervals, no concerning ST changes.  ____________________________________________    RADIOLOGY  X-ray negative  ____________________________________________   INITIAL IMPRESSION / ASSESSMENT AND PLAN / ED COURSE  Pertinent labs & imaging results that were available during my care of the patient were reviewed by me and considered in my medical decision making (see chart for details).  Patient presents to the emergency department for chest pain beginning at 7:00 this morning with left arm radiation.  Also states that headache as well.  Differential includes ACS, chest wall pain, angina, PE or pneumonia, pneumothorax.  We will check labs including troponin and a d-dimer.  Will obtain an EKG and a chest x-ray and continue to closely monitor.  We will dose nitroglycerin and morphine for pain control.  Patient agreeable to plan of care.  Specifically denies morphine allergy.  Patient's work-up has so far been largely normal.  Labs are largely within normal limits including normal LFTs, lipase and troponin.  Slight leukocytosis of 12,000.  Chest x-ray is clear.  EKG is reassuring.  Patient states his chest pain is diminished she still continues to feel a pressure sensation.  States he has had similar issues with panic disorder in the past has a history of PTSD is wondering if this could be related, possibly.  Patient d-dimer is negative.  Will obtain a repeat troponin at 130.  If the repeat troponin is negative and the patient continues to appear well anticipate likely discharge home.  Patient's repeat troponin is negative.  Patient appears much improved.  We will  discharge with PCP follow-up.  ____________________________________________   FINAL CLINICAL IMPRESSION(S) / ED DIAGNOSES  Chest pain    Harvest Dark, MD 04/02/18 1411

## 2018-04-02 NOTE — ED Notes (Signed)
Family at bedside. 2L [place] initiated for comfort, pt mouth breathing at 28 bpm.

## 2018-05-13 ENCOUNTER — Emergency Department: Payer: Medicare HMO

## 2018-05-13 ENCOUNTER — Encounter: Payer: Self-pay | Admitting: Emergency Medicine

## 2018-05-13 ENCOUNTER — Other Ambulatory Visit: Payer: Self-pay

## 2018-05-13 DIAGNOSIS — J181 Lobar pneumonia, unspecified organism: Secondary | ICD-10-CM | POA: Insufficient documentation

## 2018-05-13 DIAGNOSIS — Z7982 Long term (current) use of aspirin: Secondary | ICD-10-CM | POA: Diagnosis not present

## 2018-05-13 DIAGNOSIS — I10 Essential (primary) hypertension: Secondary | ICD-10-CM | POA: Insufficient documentation

## 2018-05-13 DIAGNOSIS — F17210 Nicotine dependence, cigarettes, uncomplicated: Secondary | ICD-10-CM | POA: Diagnosis not present

## 2018-05-13 DIAGNOSIS — E119 Type 2 diabetes mellitus without complications: Secondary | ICD-10-CM | POA: Diagnosis not present

## 2018-05-13 DIAGNOSIS — Z79899 Other long term (current) drug therapy: Secondary | ICD-10-CM | POA: Insufficient documentation

## 2018-05-13 DIAGNOSIS — R0602 Shortness of breath: Secondary | ICD-10-CM | POA: Insufficient documentation

## 2018-05-13 LAB — CBC WITH DIFFERENTIAL/PLATELET
BASOS ABS: 0.1 10*3/uL (ref 0–0.1)
BASOS PCT: 0 %
Eosinophils Absolute: 0.9 10*3/uL — ABNORMAL HIGH (ref 0–0.7)
Eosinophils Relative: 8 %
HCT: 49.1 % (ref 40.0–52.0)
HEMOGLOBIN: 17.4 g/dL (ref 13.0–18.0)
Lymphocytes Relative: 22 %
Lymphs Abs: 2.8 10*3/uL (ref 1.0–3.6)
MCH: 31.9 pg (ref 26.0–34.0)
MCHC: 35.5 g/dL (ref 32.0–36.0)
MCV: 89.7 fL (ref 80.0–100.0)
MONOS PCT: 6 %
Monocytes Absolute: 0.8 10*3/uL (ref 0.2–1.0)
NEUTROS ABS: 8.1 10*3/uL — AB (ref 1.4–6.5)
NEUTROS PCT: 64 %
Platelets: 250 10*3/uL (ref 150–440)
RBC: 5.47 MIL/uL (ref 4.40–5.90)
RDW: 14.7 % — AB (ref 11.5–14.5)
WBC: 12.6 10*3/uL — ABNORMAL HIGH (ref 3.8–10.6)

## 2018-05-13 LAB — COMPREHENSIVE METABOLIC PANEL
ALBUMIN: 4.6 g/dL (ref 3.5–5.0)
ALT: 60 U/L (ref 17–63)
ANION GAP: 15 (ref 5–15)
AST: 50 U/L — AB (ref 15–41)
Alkaline Phosphatase: 107 U/L (ref 38–126)
BUN: 8 mg/dL (ref 6–20)
CALCIUM: 9.4 mg/dL (ref 8.9–10.3)
CHLORIDE: 95 mmol/L — AB (ref 101–111)
CO2: 23 mmol/L (ref 22–32)
Creatinine, Ser: 0.69 mg/dL (ref 0.61–1.24)
Glucose, Bld: 259 mg/dL — ABNORMAL HIGH (ref 65–99)
Potassium: 3.1 mmol/L — ABNORMAL LOW (ref 3.5–5.1)
Sodium: 133 mmol/L — ABNORMAL LOW (ref 135–145)
Total Bilirubin: 0.8 mg/dL (ref 0.3–1.2)
Total Protein: 8.3 g/dL — ABNORMAL HIGH (ref 6.5–8.1)

## 2018-05-13 NOTE — ED Triage Notes (Signed)
Pt presents to ED with productive cough and fatigue for the past 4 days. Pt states he has noticed dark gray colored phlegm coming up when he coughs. Denies fever.

## 2018-05-14 ENCOUNTER — Emergency Department
Admission: EM | Admit: 2018-05-14 | Discharge: 2018-05-14 | Disposition: A | Discharge status: Home / Self Care | Payer: Medicare HMO | Attending: Emergency Medicine | Admitting: Emergency Medicine

## 2018-05-14 DIAGNOSIS — J181 Lobar pneumonia, unspecified organism: Secondary | ICD-10-CM

## 2018-05-14 DIAGNOSIS — R0602 Shortness of breath: Secondary | ICD-10-CM

## 2018-05-14 DIAGNOSIS — J189 Pneumonia, unspecified organism: Secondary | ICD-10-CM

## 2018-05-14 HISTORY — DX: Type 2 diabetes mellitus without complications: E11.9

## 2018-05-14 HISTORY — DX: Other pulmonary embolism without acute cor pulmonale: I26.99

## 2018-05-14 MED ORDER — LEVOFLOXACIN 750 MG PO TABS
ORAL_TABLET | ORAL | Status: AC
  Filled 2018-05-14: qty 1

## 2018-05-14 MED ORDER — LEVOFLOXACIN 750 MG PO TABS
750.0000 mg | ORAL_TABLET | Freq: Once | ORAL | Status: AC
Start: 2018-05-14 — End: 2018-05-14
  Administered 2018-05-14: 750 mg via ORAL

## 2018-05-14 MED ORDER — LEVOFLOXACIN 750 MG PO TABS: 750.0000 mg | ORAL_TABLET | Freq: Every day | ORAL | 0 refills | Status: DC

## 2018-05-14 MED ORDER — FIRST-DUKES MOUTHWASH MT SUSP: 5.0000 mL | Freq: Three times a day (TID) | OROMUCOSAL | 0 refills | Status: DC

## 2018-05-14 MED ORDER — PREDNISONE 20 MG PO TABS
40.0000 mg | ORAL_TABLET | ORAL | Status: AC
  Administered 2018-05-14: 40 mg via ORAL
  Filled 2018-05-14: qty 2

## 2018-05-14 MED ORDER — GUAIFENESIN 100 MG/5ML PO SOLN: 5.0000 mL | ORAL | 0 refills | Status: DC | PRN

## 2018-05-14 NOTE — ED Provider Notes (Signed)
[name]  ____________________________________________  Time seen: Approximately 1:59 AM  I have reviewed the triage vital signs and the nursing notes.   HISTORY  Chief Complaint Shortness of Breath    HPI Caleb [name] is a 42 y.o. male with a history of diabetes, GERD, asthma who complains of shortness of breath and productive cough for the past 4 days.   Also has chills and sweats, worse at night.  No fever.  Shortness of breath is mild to moderate without aggravating or alleviating factors.  He also has some "burning" chest discomfort diffusely when he takes a deep breath.  Intermittent, not sharp.  Reports the cough is productive of grayish sputum     Past Medical History:  Diagnosis Date  . Anxiety   . Diabetes mellitus without complication ([place])   . GERD (gastroesophageal reflux disease)   . History of degenerative disc disease   . Hypercholesteremia   . Hypertension   . Knee pain, bilateral   . PTSD (post-traumatic stress disorder)   . Pulmonary embolism ([place])   . Stroke [facility]) 2015   mold stroke per patient     Patient Active Problem List   Diagnosis Date Noted  . Unstable angina ([place]) 01/24/2017  . Diverticulitis 02/26/2016  . Diverticulitis of large intestine without perforation or abscess without bleeding      Past Surgical History:  Procedure Laterality Date  . COLONOSCOPY WITH PROPOFOL N/A 05/07/2017   Procedure: COLONOSCOPY WITH PROPOFOL;  Surgeon: Lollie Sails, MD;  Location: [facility] ENDOSCOPY;  Service: Endoscopy;  Laterality: N/A;  . ESOPHAGOGASTRODUODENOSCOPY (EGD) WITH PROPOFOL N/A 05/07/2017   Procedure: ESOPHAGOGASTRODUODENOSCOPY (EGD) WITH PROPOFOL;  Surgeon: Lollie Sails, MD;  Location: [facility] ENDOSCOPY;  Service: Endoscopy;  Laterality: N/A;  . HERNIA REPAIR    . Knee surgeries    . TONSILLECTOMY       Prior to Admission medications   Medication Sig Start Date  End Date Taking? Authorizing Provider  aspirin EC 81 MG tablet Take 81 mg by mouth daily.    [provider]  atorvastatin (LIPITOR) 80 MG tablet Take 80 mg by mouth at bedtime.  03/04/15   [provider]  clotrimazole (MYCELEX) 10 MG troche Take 1 tablet (10 mg total) by mouth 5 (five) times daily. Patient not taking: Reported on 01/24/2017 07/18/16   Menshew, Dannielle Karvonen, PA-C  cyclobenzaprine (FLEXERIL) 10 MG tablet Take 1 tablet (10 mg total) by mouth 3 (three) times daily as needed for muscle spasms. Patient not taking: Reported on 05/07/2017 04/22/17   Sherrie George B, FNP  diazepam (VALIUM) 5 MG tablet Take 10 mg by mouth at bedtime.  01/14/15   [provider]  diclofenac sodium (VOLTAREN) 1 % GEL Apply 2 g topically 4 (four) times daily.    [provider]  Diphenhyd-Hydrocort-Nystatin (FIRST-DUKES MOUTHWASH) SUSP Use as directed 5 mLs in the mouth or throat 3 (three) times daily. 05/14/18   Carrie Mew, MD  diphenhydrAMINE (BENADRYL) 25 mg capsule Take 50 mg by mouth every 6 (six) hours as needed for itching or allergies.    [provider]  erythromycin ophthalmic ointment Place 1 application into the left eye 4 (four) times daily. Patient not taking: Reported on 05/07/2017 03/28/17   Little, Traci M, PA-C  esomeprazole (NEXIUM) 40 MG capsule Take 40 mg by mouth daily. 06/13/16 06/13/17  [provider]  gabapentin (NEURONTIN) 100 MG capsule Take 300 mg by mouth at  bedtime. 08/24/15   [provider]  guaiFENesin (ROBITUSSIN) 100 MG/5ML SOLN Take 5 mLs (100 mg total) by mouth every 4 (four) hours as needed for cough or to loosen phlegm. 05/14/18   Carrie Mew, MD  ketorolac (ACULAR) 0.5 % ophthalmic solution Place 1 drop into the left eye 4 (four) times daily. Patient not taking: Reported on 05/07/2017 03/28/17   Little, Traci M, PA-C  levofloxacin (LEVAQUIN) 750 MG tablet Take 1 tablet (750 mg total) by mouth daily. 05/14/18    Carrie Mew, MD  losartan-hydrochlorothiazide (HYZAAR) 100-25 MG per tablet Take 1 tablet by mouth daily. 02/25/15   [provider]  methocarbamol (ROBAXIN-750) 750 MG tablet Take 1 tablet (750 mg total) by mouth every 8 (eight) hours as needed for muscle spasms. Patient not taking: Reported on 01/24/2017 10/18/15   Victorino Dike, FNP  metroNIDAZOLE (FLAGYL) 500 MG tablet Take 1 tablet (500 mg total) by mouth 2 (two) times daily. Patient not taking: Reported on 01/24/2017 02/25/16   Lisa Roca, MD  naproxen (NAPROSYN) 500 MG tablet Take 1 tablet (500 mg total) by mouth 2 (two) times daily with a meal. 04/22/17   Triplett, Cari B, FNP  ondansetron (ZOFRAN) 4 MG tablet Take 1 tablet (4 mg total) by mouth daily as needed for nausea or vomiting. 11/11/15   Orbie Pyo, MD  oxyCODONE (ROXICODONE) 15 MG immediate release tablet Take 15 mg by mouth 5 (five) times daily. For pain. 09/25/15   [provider]  pantoprazole (PROTONIX) 40 MG tablet Take 1 tablet by mouth daily. 12/15/14   [provider]  penicillin v potassium (VEETID) 500 MG tablet Take 1 tablet (500 mg total) by mouth 4 (four) times daily. Patient not taking: Reported on 05/07/2017 01/25/17   Max Sane, MD  pregabalin (LYRICA) 75 MG capsule Take 75 mg by mouth 3 (three) times daily as needed.  06/13/16 06/13/17  [provider]     Allergies Bee pollen; Contrast media [iodinated diagnostic agents]; Metformin and related; Dilaudid [hydromorphone hcl]; and Lisinopril   Family History  Problem Relation Age of Onset  . Diabetes Mother   . Cancer Paternal Grandmother   . Cancer Paternal Grandfather     Social History Social History   Tobacco Use  . Smoking status: Current Every Day Smoker    Packs/day: 1.00    Years: 20.00    Pack years: 20.00    Types: Cigarettes  . Smokeless tobacco: Never Used  Substance Use Topics  . Alcohol use: Yes    Alcohol/week: 2.4 oz    Types: 4 Cans  of beer per week    Comment: occas.   . Drug use: No    Review of Systems  Constitutional:   No fever positive chills.  ENT:   No sore throat. No rhinorrhea. Cardiovascular:   No chest pain or syncope. Respiratory:   Positive shortness of breath and purulent cough. Gastrointestinal:   Negative for abdominal pain, vomiting and diarrhea.  Musculoskeletal:   Negative for focal pain or swelling All other systems reviewed and are negative except as documented above in ROS and HPI.  ____________________________________________   PHYSICAL EXAM:  VITAL SIGNS: ED Triage Vitals  Enc Vitals Group     BP 05/13/18 2226 (!) 144/95     Pulse Rate 05/13/18 2226 99     Resp 05/13/18 2226 (!) 24     Temp 05/13/18 2226 98.1 F (36.7 C)     Temp Source 05/13/18 2226  Oral     SpO2 05/13/18 2226 93 %     Weight 05/13/18 2229 250 lb (113.4 kg)     Height 05/13/18 2229 6' (1.829 m)     Head Circumference --      Peak Flow --      Pain Score 05/13/18 2238 5     Pain Loc --      Pain Edu? --      Excl. in [place]? --     Vital signs reviewed, nursing assessments reviewed.   Constitutional:   Alert and oriented. Non-toxic appearance. Eyes:   Conjunctivae are normal. EOMI. PERRL. ENT      Head:   Normocephalic and atraumatic.      Nose:   No congestion/rhinnorhea.       Mouth/Throat:   MMM, no pharyngeal erythema. No peritonsillar mass.       Neck:   No meningismus. Full ROM.  Thyroid nonpalpable Hematological/Lymphatic/Immunilogical:   No cervical lymphadenopathy. Cardiovascular:   RRR. Symmetric bilateral radial and DP pulses.  No murmurs.  Respiratory:   Normal respiratory effort without tachypnea/retractions.  No wheezing.  Good air entry in all lung fields.  Focal crackles in the left middle and lower lung fields. Gastrointestinal:   Soft and nontender. Non distended. There is no CVA tenderness.  No rebound, rigidity, or guarding.  Musculoskeletal:   Normal range of motion in all  extremities. No joint effusions.  No lower extremity tenderness.  No edema. Neurologic:   Normal speech and language.  Motor grossly intact. No acute focal neurologic deficits are appreciated.  Skin:    Skin is warm, dry and intact. No rash noted.  No petechiae, purpura, or bullae.  ____________________________________________    LABS (pertinent positives/negatives) (all labs ordered are listed, but only abnormal results are displayed) Labs Reviewed  CBC WITH DIFFERENTIAL/PLATELET - Abnormal; Notable for the following components:      Result Value   WBC 12.6 (*)    RDW 14.7 (*)    Neutro Abs 8.1 (*)    Eosinophils Absolute 0.9 (*)    All other components within normal limits  COMPREHENSIVE METABOLIC PANEL - Abnormal; Notable for the following components:   Sodium 133 (*)    Potassium 3.1 (*)    Chloride 95 (*)    Glucose, Bld 259 (*)    Total Protein 8.3 (*)    AST 50 (*)    All other components within normal limits   ____________________________________________   EKG    ____________________________________________    RADIOLOGY  Dg Chest 2 View  Result Date: 05/13/2018 CLINICAL DATA:  Productive cough EXAM: CHEST - 2 VIEW COMPARISON:  04/02/2018 chest radiograph. FINDINGS: Stable cardiomediastinal silhouette with normal heart size. No pneumothorax. No pleural effusion. Mild hazy opacity in the lingula. IMPRESSION: Mild hazy opacity in the lingula, which may represent a lingular pneumonia. Recommend follow-up PA and lateral post treatment chest radiographs in 4-6 weeks. Electronically Signed   By: Ilona Sorrel M.D.   On: 05/13/2018 23:22    ____________________________________________   PROCEDURES Procedures  ____________________________________________    CLINICAL IMPRESSION / ASSESSMENT AND PLAN / ED COURSE  Pertinent labs & imaging results that were available during my care of the patient were reviewed by me and considered in my medical decision making (see  chart for details).    Patient presents with shortness of breath and productive cough, found to have crackles in the left lower lung with chest x-ray also showing evidence of pneumonia  in this area.  Not hypoxic, not septic.  Will treat with Levaquin given his underlying diabetes and asthma.  Counseled about the risk of connective tissue injury with flora quinolones, he reports he is on disability and will not be doing any strenuous activity anyway and will be sure to rest the next several days while he is feeling sick.  I doubt ACS PE dissection.  No evidence of pneumothorax or carditis.  Patient is suitable for outpatient management at this time.  Counseled return to ED if worsening.  Patient reports a history of thrush after taking antibiotics.  I will provide him a prescription for nystatin mouthwash to use as needed.      ____________________________________________   FINAL CLINICAL IMPRESSION(S) / ED DIAGNOSES    Final diagnoses:  Pneumonia of left lower lobe due to infectious organism ([place])  SOB (shortness of breath)     ED Discharge Orders        Ordered    levofloxacin (LEVAQUIN) 750 MG tablet  Daily     05/14/18 0159    guaiFENesin (ROBITUSSIN) 100 MG/5ML SOLN  Every 4 hours PRN     05/14/18 0159    Diphenhyd-Hydrocort-Nystatin (FIRST-DUKES MOUTHWASH) SUSP  3 times daily     05/14/18 0207      Portions of this note were generated with dragon dictation software. Dictation errors may occur despite best attempts at proofreading.    Carrie Mew, MD 05/14/18 [phone]

## 2018-06-29 IMAGING — CR DG CHEST 2V
2 series · 2 of 2 positions shown · non-contrast
Comparison: 04/02/2018 chest radiograph.

CLINICAL DATA: Productive cough

EXAM:
CHEST - 2 VIEW

[chest pa]
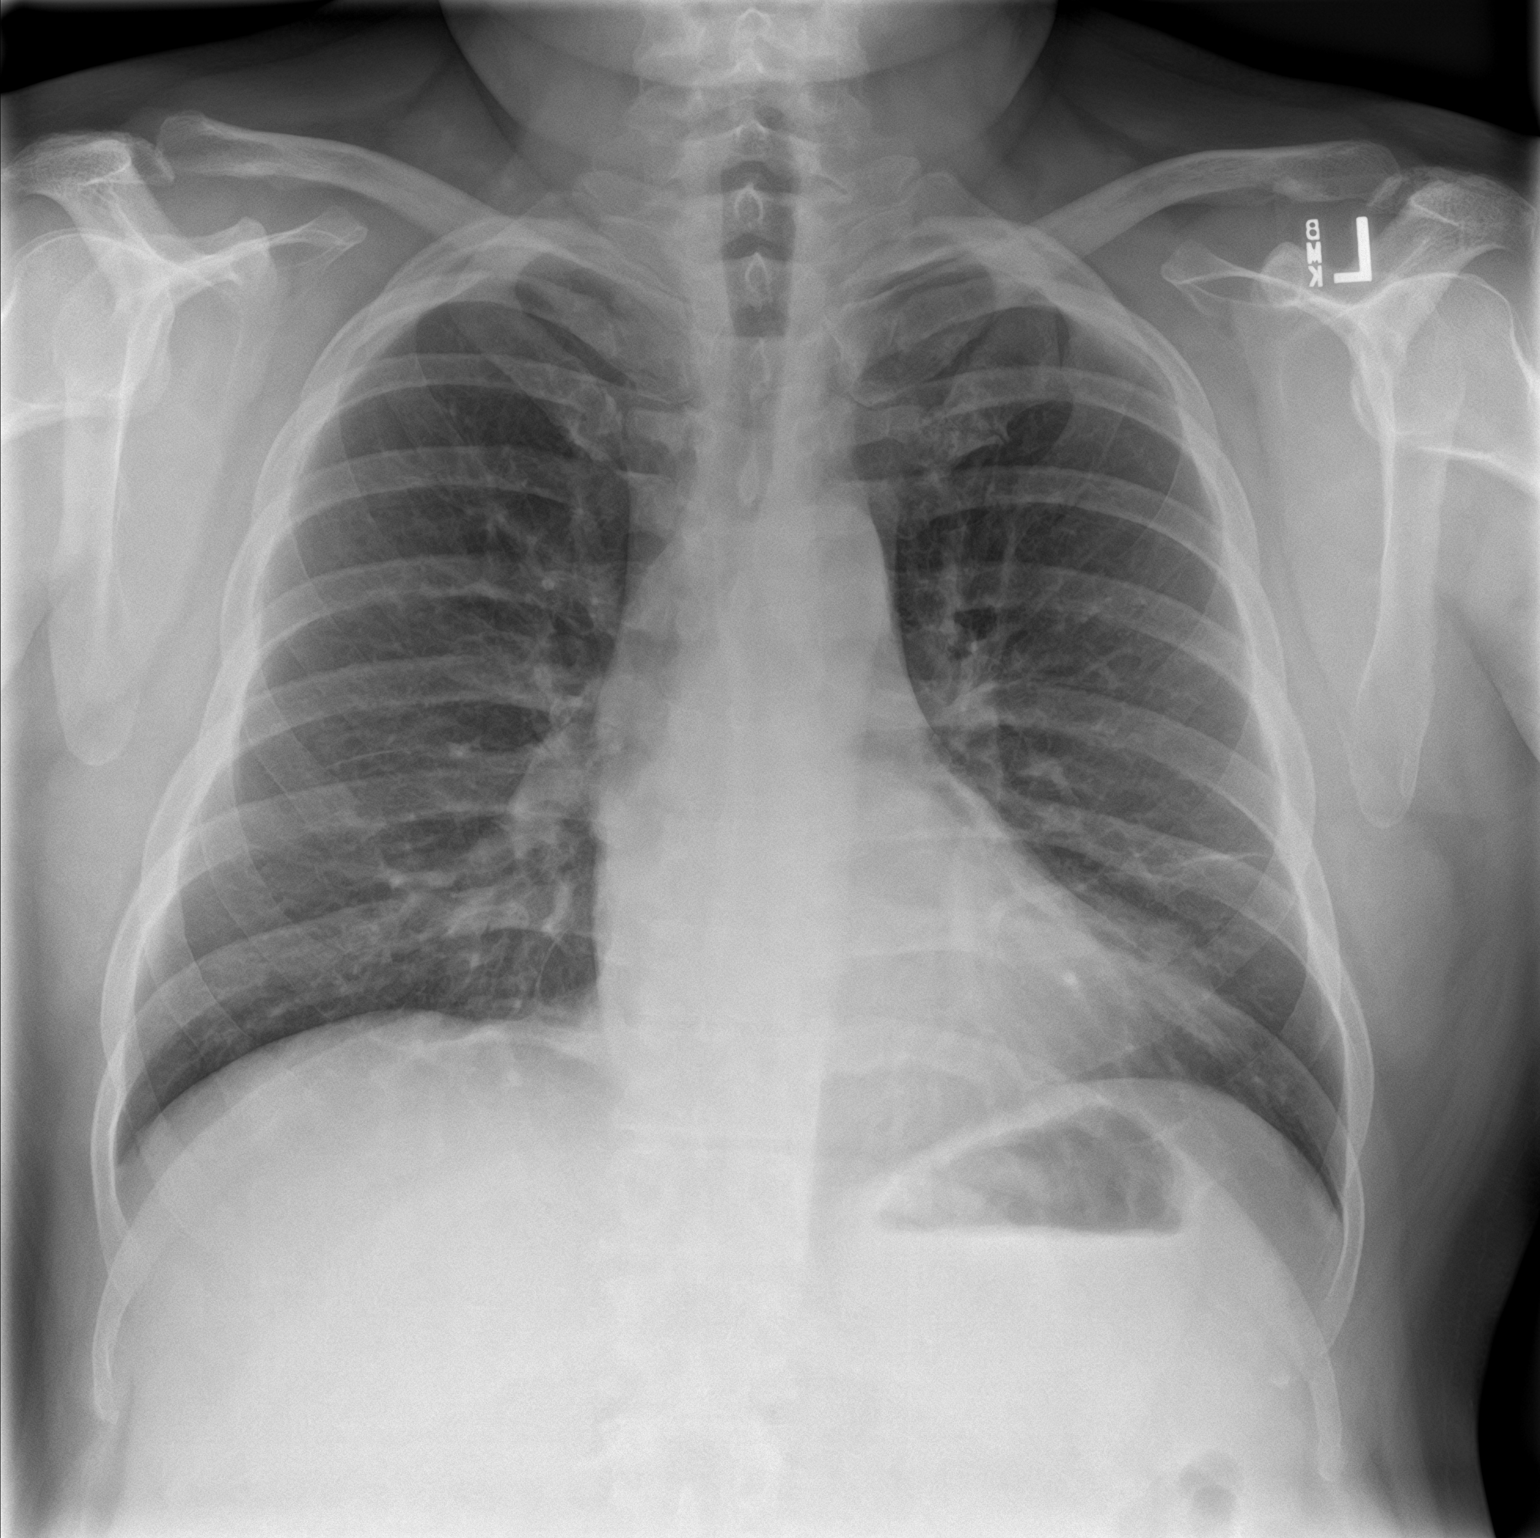

[chest lat]
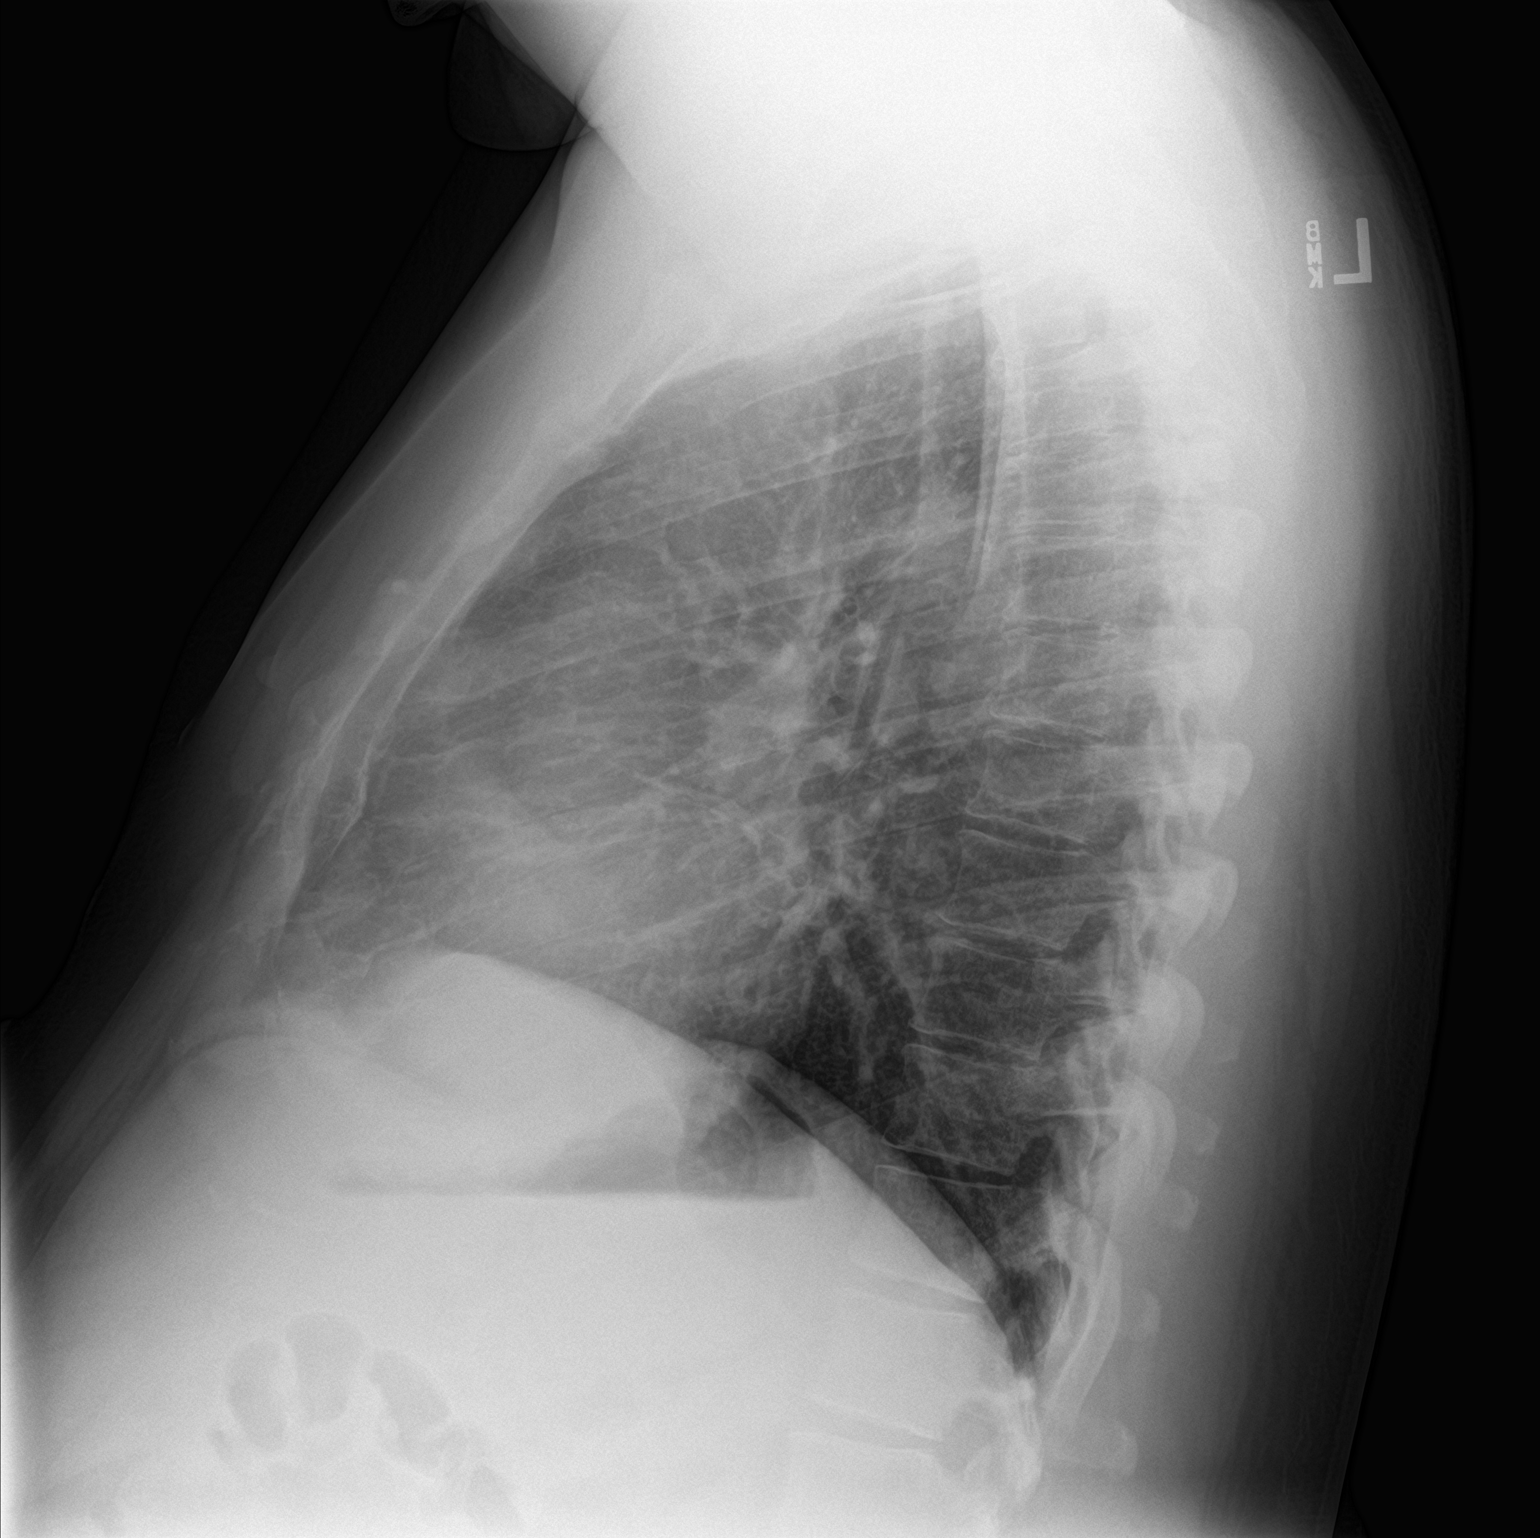

[2 of 2 positions shown; findings below may reference images not displayed]

FINDINGS: Stable cardiomediastinal silhouette with normal heart size. No
pneumothorax. No pleural effusion. Mild hazy opacity in the lingula.
IMPRESSION: Mild hazy opacity in the lingula, which may represent a lingular
pneumonia. Recommend follow-up PA and lateral post treatment chest
radiographs in 4-6 weeks.

## 2018-06-30 ENCOUNTER — Encounter (HOSPITAL_COMMUNITY): Payer: Self-pay | Admitting: Emergency Medicine

## 2018-06-30 ENCOUNTER — Observation Stay (HOSPITAL_COMMUNITY)
Admission: EM | Admit: 2018-06-30 | Discharge: 2018-07-02 | Disposition: A | Discharge status: Home / Self Care | Payer: Medicare HMO | Attending: Internal Medicine | Admitting: Internal Medicine

## 2018-06-30 ENCOUNTER — Emergency Department (HOSPITAL_COMMUNITY): Payer: Medicare HMO

## 2018-06-30 DIAGNOSIS — Z7982 Long term (current) use of aspirin: Secondary | ICD-10-CM | POA: Diagnosis not present

## 2018-06-30 DIAGNOSIS — I2510 Atherosclerotic heart disease of native coronary artery without angina pectoris: Secondary | ICD-10-CM | POA: Insufficient documentation

## 2018-06-30 DIAGNOSIS — Z79891 Long term (current) use of opiate analgesic: Secondary | ICD-10-CM | POA: Diagnosis not present

## 2018-06-30 DIAGNOSIS — R0789 Other chest pain: Principal | ICD-10-CM | POA: Insufficient documentation

## 2018-06-30 DIAGNOSIS — K219 Gastro-esophageal reflux disease without esophagitis: Secondary | ICD-10-CM | POA: Diagnosis not present

## 2018-06-30 DIAGNOSIS — R9431 Abnormal electrocardiogram [ECG] [EKG]: Secondary | ICD-10-CM | POA: Diagnosis not present

## 2018-06-30 DIAGNOSIS — Z8673 Personal history of transient ischemic attack (TIA), and cerebral infarction without residual deficits: Secondary | ICD-10-CM | POA: Insufficient documentation

## 2018-06-30 DIAGNOSIS — R079 Chest pain, unspecified: Secondary | ICD-10-CM

## 2018-06-30 DIAGNOSIS — F17210 Nicotine dependence, cigarettes, uncomplicated: Secondary | ICD-10-CM | POA: Diagnosis not present

## 2018-06-30 DIAGNOSIS — F419 Anxiety disorder, unspecified: Secondary | ICD-10-CM | POA: Insufficient documentation

## 2018-06-30 DIAGNOSIS — E7800 Pure hypercholesterolemia, unspecified: Secondary | ICD-10-CM | POA: Diagnosis not present

## 2018-06-30 DIAGNOSIS — R809 Proteinuria, unspecified: Secondary | ICD-10-CM

## 2018-06-30 DIAGNOSIS — E876 Hypokalemia: Secondary | ICD-10-CM | POA: Diagnosis not present

## 2018-06-30 DIAGNOSIS — D72829 Elevated white blood cell count, unspecified: Secondary | ICD-10-CM | POA: Diagnosis not present

## 2018-06-30 DIAGNOSIS — E1129 Type 2 diabetes mellitus with other diabetic kidney complication: Secondary | ICD-10-CM

## 2018-06-30 DIAGNOSIS — Z791 Long term (current) use of non-steroidal anti-inflammatories (NSAID): Secondary | ICD-10-CM | POA: Diagnosis not present

## 2018-06-30 DIAGNOSIS — E875 Hyperkalemia: Secondary | ICD-10-CM | POA: Insufficient documentation

## 2018-06-30 DIAGNOSIS — I639 Cerebral infarction, unspecified: Secondary | ICD-10-CM | POA: Diagnosis not present

## 2018-06-30 DIAGNOSIS — I10 Essential (primary) hypertension: Secondary | ICD-10-CM | POA: Diagnosis not present

## 2018-06-30 DIAGNOSIS — Z79899 Other long term (current) drug therapy: Secondary | ICD-10-CM | POA: Insufficient documentation

## 2018-06-30 DIAGNOSIS — G8929 Other chronic pain: Secondary | ICD-10-CM | POA: Insufficient documentation

## 2018-06-30 DIAGNOSIS — I160 Hypertensive urgency: Secondary | ICD-10-CM | POA: Diagnosis present

## 2018-06-30 DIAGNOSIS — E1165 Type 2 diabetes mellitus with hyperglycemia: Secondary | ICD-10-CM

## 2018-06-30 DIAGNOSIS — F4310 Post-traumatic stress disorder, unspecified: Secondary | ICD-10-CM | POA: Insufficient documentation

## 2018-06-30 DIAGNOSIS — Z7951 Long term (current) use of inhaled steroids: Secondary | ICD-10-CM | POA: Insufficient documentation

## 2018-06-30 DIAGNOSIS — W19XXXA Unspecified fall, initial encounter: Secondary | ICD-10-CM | POA: Diagnosis present

## 2018-06-30 DIAGNOSIS — Z86711 Personal history of pulmonary embolism: Secondary | ICD-10-CM | POA: Diagnosis not present

## 2018-06-30 DIAGNOSIS — E119 Type 2 diabetes mellitus without complications: Secondary | ICD-10-CM

## 2018-06-30 LAB — I-STAT TROPONIN, ED
TROPONIN I, POC: 0 ng/mL (ref 0.00–0.08)
Troponin i, poc: 0.01 ng/mL (ref 0.00–0.08)

## 2018-06-30 LAB — BASIC METABOLIC PANEL
ANION GAP: 15 (ref 5–15)
BUN: 9 mg/dL (ref 6–20)
CALCIUM: 9.6 mg/dL (ref 8.9–10.3)
CO2: 21 mmol/L — ABNORMAL LOW (ref 22–32)
CREATININE: 0.89 mg/dL (ref 0.61–1.24)
Chloride: 102 mmol/L (ref 98–111)
GFR calc Af Amer: >60 mL/min (ref >60)
GLUCOSE: 299 mg/dL — AB (ref 70–99)
Potassium: 3.4 mmol/L — ABNORMAL LOW (ref 3.5–5.1)
Sodium: 138 mmol/L (ref 135–145)

## 2018-06-30 LAB — CBC
HCT: 45 % (ref 39.0–52.0)
Hemoglobin: 15.9 g/dL (ref 13.0–17.0)
MCH: 31.2 pg (ref 26.0–34.0)
MCHC: 35.3 g/dL (ref 30.0–36.0)
MCV: 88.4 fL (ref 78.0–100.0)
PLATELETS: 287 10*3/uL (ref 150–400)
RBC: 5.09 MIL/uL (ref 4.22–5.81)
RDW: 13.1 % (ref 11.5–15.5)
WBC: 12.4 10*3/uL — AB (ref 4.0–10.5)

## 2018-06-30 LAB — D-DIMER, QUANTITATIVE (NOT AT ARMC)

## 2018-06-30 MED ORDER — GUAIFENESIN 100 MG/5ML PO SOLN: 5.0000 mL | ORAL | Status: DC | PRN

## 2018-06-30 MED ORDER — DIAZEPAM 5 MG PO TABS
5.0000 mg | ORAL_TABLET | Freq: Every day | ORAL | Status: DC
  Administered 2018-07-01: 5 mg via ORAL
  Filled 2018-06-30: qty 1

## 2018-06-30 MED ORDER — ONDANSETRON HCL 4 MG/2ML IJ SOLN
4.0000 mg | Freq: Once | INTRAMUSCULAR | Status: AC
  Administered 2018-06-30: 4 mg via INTRAVENOUS
  Filled 2018-06-30: qty 2

## 2018-06-30 MED ORDER — OXYCODONE HCL 5 MG PO TABS
15.0000 mg | ORAL_TABLET | Freq: Every day | ORAL | Status: DC
  Administered 2018-07-01 – 2018-07-02 (×8): 15 mg via ORAL
  Filled 2018-06-30 (×9): qty 3

## 2018-06-30 MED ORDER — HYDROMORPHONE HCL 1 MG/ML IJ SOLN
1.0000 mg | INTRAMUSCULAR | Status: DC | PRN
  Administered 2018-07-01 – 2018-07-02 (×7): 1 mg via INTRAVENOUS
  Filled 2018-06-30 (×7): qty 1

## 2018-06-30 MED ORDER — MAGIC MOUTHWASH
5.0000 mL | Freq: Three times a day (TID) | ORAL | Status: DC
  Administered 2018-07-02: 5 mL via ORAL
  Filled 2018-06-30 (×6): qty 5

## 2018-06-30 MED ORDER — MORPHINE SULFATE (PF) 4 MG/ML IV SOLN
4.0000 mg | Freq: Once | INTRAVENOUS | Status: AC
  Administered 2018-06-30: 4 mg via INTRAVENOUS
  Filled 2018-06-30: qty 1

## 2018-06-30 MED ORDER — ZOLPIDEM TARTRATE 5 MG PO TABS: 5.0000 mg | ORAL_TABLET | Freq: Every evening | ORAL | Status: DC | PRN

## 2018-06-30 MED ORDER — BACLOFEN 10 MG PO TABS
10.0000 mg | ORAL_TABLET | Freq: Every day | ORAL | Status: DC
  Administered 2018-07-01 (×2): 10 mg via ORAL
  Filled 2018-06-30 (×2): qty 1

## 2018-06-30 MED ORDER — ONDANSETRON HCL 4 MG/2ML IJ SOLN: 4.0000 mg | Freq: Four times a day (QID) | INTRAMUSCULAR | Status: DC | PRN

## 2018-06-30 MED ORDER — ICY HOT 10-30 % EX STCK: Freq: Every day | CUTANEOUS | Status: DC | PRN

## 2018-06-30 MED ORDER — POTASSIUM CHLORIDE 20 MEQ/15ML (10%) PO SOLN
20.0000 meq | Freq: Once | ORAL | Status: AC
  Administered 2018-07-01: 20 meq via ORAL
  Filled 2018-06-30: qty 15

## 2018-06-30 MED ORDER — HYDRALAZINE HCL 20 MG/ML IJ SOLN: 5.0000 mg | INTRAMUSCULAR | Status: DC | PRN

## 2018-06-30 MED ORDER — GABAPENTIN 300 MG PO CAPS: 300.0000 mg | ORAL_CAPSULE | Freq: Every day | ORAL | Status: DC

## 2018-06-30 MED ORDER — LORAZEPAM 2 MG/ML IJ SOLN
1.0000 mg | Freq: Once | INTRAMUSCULAR | Status: AC
  Administered 2018-06-30: 1 mg via INTRAVENOUS
  Filled 2018-06-30: qty 1

## 2018-06-30 MED ORDER — ATORVASTATIN CALCIUM 80 MG PO TABS
80.0000 mg | ORAL_TABLET | Freq: Every day | ORAL | Status: DC
  Administered 2018-07-01 (×2): 80 mg via ORAL
  Filled 2018-06-30 (×2): qty 1

## 2018-06-30 MED ORDER — NICOTINE 21 MG/24HR TD PT24
21.0000 mg | MEDICATED_PATCH | Freq: Every day | TRANSDERMAL | Status: DC
  Administered 2018-07-01 – 2018-07-02 (×2): 21 mg via TRANSDERMAL
  Filled 2018-06-30 (×2): qty 1

## 2018-06-30 MED ORDER — DIPHENHYDRAMINE HCL 25 MG PO CAPS: 50.0000 mg | ORAL_CAPSULE | Freq: Four times a day (QID) | ORAL | Status: DC | PRN

## 2018-06-30 MED ORDER — NITROGLYCERIN IN D5W 200-5 MCG/ML-% IV SOLN
0.0000 ug/min | Freq: Once | INTRAVENOUS | Status: AC
  Administered 2018-06-30: 5 ug/min via INTRAVENOUS
  Filled 2018-06-30: qty 250

## 2018-06-30 MED ORDER — PANTOPRAZOLE SODIUM 40 MG PO TBEC
40.0000 mg | DELAYED_RELEASE_TABLET | Freq: Every day | ORAL | Status: DC
  Administered 2018-07-01 – 2018-07-02 (×2): 40 mg via ORAL
  Filled 2018-06-30 (×2): qty 1

## 2018-06-30 MED ORDER — NAPROXEN 250 MG PO TABS
500.0000 mg | ORAL_TABLET | Freq: Two times a day (BID) | ORAL | Status: DC
  Administered 2018-07-01 – 2018-07-02 (×3): 500 mg via ORAL
  Filled 2018-06-30 (×4): qty 2

## 2018-06-30 MED ORDER — NAPHAZOLINE-GLYCERIN 0.012-0.2 % OP SOLN: 1.0000 [drp] | Freq: Four times a day (QID) | OPHTHALMIC | Status: DC | PRN

## 2018-06-30 MED ORDER — HYDROXYZINE HCL 50 MG PO TABS
50.0000 mg | ORAL_TABLET | Freq: Every day | ORAL | Status: DC
  Administered 2018-07-01: 50 mg via ORAL
  Filled 2018-06-30: qty 1

## 2018-06-30 MED ORDER — LORAZEPAM 2 MG/ML IJ SOLN
1.0000 mg | Freq: Once | INTRAMUSCULAR | Status: AC
  Administered 2018-07-01: 1 mg via INTRAVENOUS
  Filled 2018-06-30: qty 1

## 2018-06-30 MED ORDER — ALBUTEROL SULFATE (2.5 MG/3ML) 0.083% IN NEBU: 3.0000 mL | INHALATION_SOLUTION | Freq: Four times a day (QID) | RESPIRATORY_TRACT | Status: DC | PRN

## 2018-06-30 MED ORDER — LOSARTAN POTASSIUM-HCTZ 100-25 MG PO TABS: 1.0000 | ORAL_TABLET | Freq: Every day | ORAL | Status: DC

## 2018-06-30 MED ORDER — NITROGLYCERIN IN D5W 200-5 MCG/ML-% IV SOLN
0.0000 ug/min | Freq: Once | INTRAVENOUS | Status: AC
  Administered 2018-07-01: 3 ug/min via INTRAVENOUS

## 2018-06-30 MED ORDER — MONTELUKAST SODIUM 4 MG PO CHEW
4.0000 mg | CHEWABLE_TABLET | Freq: Every day | ORAL | Status: DC
  Administered 2018-07-01 (×2): 4 mg via ORAL
  Filled 2018-06-30 (×2): qty 1

## 2018-06-30 MED ORDER — B COMPLEX-C PO TABS
1.0000 | ORAL_TABLET | Freq: Every day | ORAL | Status: DC
  Administered 2018-07-01 – 2018-07-02 (×2): 1 via ORAL
  Filled 2018-06-30 (×2): qty 1

## 2018-06-30 MED ORDER — FLUTICASONE PROPIONATE 50 MCG/ACT NA SUSP
2.0000 | Freq: Every day | NASAL | Status: DC
  Administered 2018-07-01 – 2018-07-02 (×2): 2 via NASAL
  Filled 2018-06-30: qty 16

## 2018-06-30 MED ORDER — METOPROLOL SUCCINATE ER 50 MG PO TB24
50.0000 mg | ORAL_TABLET | Freq: Every day | ORAL | Status: DC
Start: 2018-07-01 — End: 2018-07-02
  Administered 2018-07-01 – 2018-07-02 (×2): 50 mg via ORAL
  Filled 2018-06-30 (×2): qty 1

## 2018-06-30 MED ORDER — DICLOFENAC SODIUM 1 % TD GEL: 2.0000 g | Freq: Every day | TRANSDERMAL | Status: DC | PRN

## 2018-06-30 MED ORDER — PREGABALIN 75 MG PO CAPS
75.0000 mg | ORAL_CAPSULE | Freq: Three times a day (TID) | ORAL | Status: DC | PRN
  Administered 2018-07-01: 75 mg via ORAL
  Filled 2018-06-30: qty 1

## 2018-06-30 MED ORDER — ASPIRIN EC 325 MG PO TBEC
325.0000 mg | DELAYED_RELEASE_TABLET | Freq: Every day | ORAL | Status: DC
  Administered 2018-07-01 – 2018-07-02 (×2): 325 mg via ORAL
  Filled 2018-06-30 (×2): qty 1

## 2018-06-30 MED ORDER — LORATADINE 10 MG PO TABS
10.0000 mg | ORAL_TABLET | Freq: Every day | ORAL | Status: DC
  Administered 2018-07-01: 10 mg via ORAL
  Filled 2018-06-30: qty 1

## 2018-06-30 MED ORDER — ENOXAPARIN SODIUM 40 MG/0.4ML ~~LOC~~ SOLN
40.0000 mg | Freq: Every day | SUBCUTANEOUS | Status: DC
  Administered 2018-07-01 (×2): 40 mg via SUBCUTANEOUS
  Filled 2018-06-30 (×2): qty 0.4

## 2018-06-30 MED ORDER — INSULIN ASPART 100 UNIT/ML ~~LOC~~ SOLN
0.0000 [IU] | Freq: Three times a day (TID) | SUBCUTANEOUS | Status: DC
  Administered 2018-07-01: 3 [IU] via SUBCUTANEOUS
  Administered 2018-07-01 (×2): 2 [IU] via SUBCUTANEOUS
  Administered 2018-07-02: 7 [IU] via SUBCUTANEOUS
  Administered 2018-07-02: 9 [IU] via SUBCUTANEOUS

## 2018-06-30 MED ORDER — ACETAMINOPHEN 325 MG PO TABS: 650.0000 mg | ORAL_TABLET | ORAL | Status: DC | PRN

## 2018-06-30 NOTE — ED Provider Notes (Signed)
[facility] EMERGENCY DEPARTMENT Provider Note   CSN: [id number] Arrival date & time: 06/30/18  1903     History   Chief Complaint Chief Complaint  Patient presents with  . Chest Pain    HPI Caleb [name] is a 42 y.o. male.  Pt presents to the ED today with cp.  Sx started about 1/2 hour pta.  Pt said it radiates to his jaw and to left arm.  He was diaphoretic and sob.  EMS gave pt 324 mg asa and 2 nitro.  CP is improved down to a 7 from a 10.  Pt said he did take his meds.     Past Medical History:  Diagnosis Date  . Anxiety   . Diabetes mellitus without complication ([place])   . GERD (gastroesophageal reflux disease)   . History of degenerative disc disease   . Hypercholesteremia   . Hypertension   . Knee pain, bilateral   . PTSD (post-traumatic stress disorder)   . Pulmonary embolism ([place])   . Stroke [facility]) 2015   mold stroke per patient    Patient Active Problem List   Diagnosis Date Noted  . Anxiety   . Diabetes mellitus without complication ([place])   . GERD (gastroesophageal reflux disease)   . Hypertension   . Hypercholesteremia   . Unstable angina ([place]) 01/24/2017  . Diverticulitis 02/26/2016  . Diverticulitis of large intestine without perforation or abscess without bleeding   . Stroke [facility]) 11/20/2013    Past Surgical History:  Procedure Laterality Date  . COLONOSCOPY WITH PROPOFOL N/A 05/07/2017   Procedure: COLONOSCOPY WITH PROPOFOL;  Surgeon: Lollie Sails, MD;  Location: [facility] ENDOSCOPY;  Service: Endoscopy;  Laterality: N/A;  . ESOPHAGOGASTRODUODENOSCOPY (EGD) WITH PROPOFOL N/A 05/07/2017   Procedure: ESOPHAGOGASTRODUODENOSCOPY (EGD) WITH PROPOFOL;  Surgeon: Lollie Sails, MD;  Location: [facility] ENDOSCOPY;  Service: Endoscopy;  Laterality: N/A;  . HERNIA REPAIR    . Knee surgeries    . TONSILLECTOMY          Home Medications    Prior to Admission medications   Medication Sig Start Date End Date Taking?  Authorizing Provider  albuterol (PROVENTIL HFA;VENTOLIN HFA) 108 (90 Base) MCG/ACT inhaler Inhale 2 puffs into the lungs every 6 (six) hours as needed for shortness of breath. 06/13/16  Yes [provider]  aspirin EC 81 MG tablet Take 81 mg by mouth daily.   Yes [provider]  aspirin-sod bicarb-citric acid (ALKA-SELTZER) 325 MG TBEF tablet Take 650 mg by mouth every 6 (six) hours as needed.   Yes [provider]  atorvastatin (LIPITOR) 80 MG tablet Take 80 mg by mouth at bedtime.  03/04/15  Yes [provider]  b complex vitamins capsule Take 1 capsule by mouth daily.   Yes [provider]  BACLOFEN PO Take 2 tablets by mouth at bedtime.   Yes [provider]  cetirizine (ZYRTEC) 10 MG tablet Take 10 mg by mouth daily.   Yes [provider]  diazepam (VALIUM) 5 MG tablet Take 5 mg by mouth at bedtime.  01/14/15  Yes [provider]  Diphenhyd-Hydrocort-Nystatin (FIRST-DUKES MOUTHWASH) SUSP Use as directed 5 mLs in the mouth or throat 3 (three) times daily. 05/14/18  Yes Carrie Mew, MD  diphenhydrAMINE (BENADRYL) 25 mg capsule Take 50 mg by mouth every 6 (six) hours as needed for itching or allergies.   Yes [provider]  esomeprazole (NEXIUM) 40 MG capsule Take 40 mg by mouth  daily. 06/13/16 06/30/18 Yes [provider]  fluticasone (FLONASE) 50 MCG/ACT nasal spray Place 2 sprays into the nose daily.   Yes [provider]  hydrOXYzine (VISTARIL) 50 MG capsule Take 50 mg by mouth daily. 06/04/17  Yes [provider]  liraglutide (VICTOZA) 18 MG/3ML SOPN Inject 1.2 mg into the skin daily. 06/06/18  Yes [provider]  losartan-hydrochlorothiazide (HYZAAR) 100-25 MG per tablet Take 1 tablet by mouth daily. 02/25/15  Yes [provider]  Menthol, Topical Analgesic, (ICY HOT EX) Apply 1 application topically daily as needed (pain).   Yes [provider]  metoprolol  succinate (TOPROL-XL) 50 MG 24 hr tablet Take 50 mg by mouth daily. 12/24/17  Yes [provider]  MONTELUKAST SODIUM PO Take 1 tablet by mouth daily.   Yes [provider]  naproxen (NAPROSYN) 500 MG tablet Take 1 tablet (500 mg total) by mouth 2 (two) times daily with a meal. 04/22/17  Yes Triplett, Cari B, FNP  naproxen sodium (ALEVE) 220 MG tablet Take 220 mg by mouth 2 (two) times daily as needed (pain).   Yes [provider]  oxyCODONE (ROXICODONE) 15 MG immediate release tablet Take 15 mg by mouth 5 (five) times daily. For pain. 09/25/15  Yes [provider]  oxymetazoline (AFRIN) 0.05 % nasal spray Place 1 spray into both nostrils 2 (two) times daily as needed for congestion.   Yes [provider]  Phenylephrine-Pheniramine-DM [facility] COLD & COUGH PO) Take 2 capsules by mouth every 4 (four) hours as needed (Cough and cold).   Yes [provider]  potassium chloride SA (KLOR-CON M20) 20 MEQ tablet Take 20 mEq by mouth daily.   Yes [provider]  pregabalin (LYRICA) 75 MG capsule Take 75 mg by mouth 3 (three) times daily as needed (Pain).  06/13/16 06/30/18 Yes [provider]  PROMETHAZINE HCL PO Take by mouth.   Yes [provider]  clotrimazole (MYCELEX) 10 MG troche Take 1 tablet (10 mg total) by mouth 5 (five) times daily. Patient not taking: Reported on 01/24/2017 07/18/16   Menshew, Dannielle Karvonen, PA-C  cyclobenzaprine (FLEXERIL) 10 MG tablet Take 1 tablet (10 mg total) by mouth 3 (three) times daily as needed for muscle spasms. Patient not taking: Reported on 05/07/2017 04/22/17   Sherrie George B, FNP  diclofenac sodium (VOLTAREN) 1 % GEL Apply 2 g topically daily as needed (pain).     [provider]  erythromycin ophthalmic ointment Place 1 application into the left eye 4 (four) times daily. Patient not taking: Reported on 05/07/2017 03/28/17   Little, Traci M, PA-C  gabapentin (NEURONTIN) 100 MG capsule  Take 300 mg by mouth at bedtime. 08/24/15   [provider]  guaiFENesin (ROBITUSSIN) 100 MG/5ML SOLN Take 5 mLs (100 mg total) by mouth every 4 (four) hours as needed for cough or to loosen phlegm. 05/14/18   Carrie Mew, MD  ketorolac (ACULAR) 0.5 % ophthalmic solution Place 1 drop into the left eye 4 (four) times daily. Patient not taking: Reported on 05/07/2017 03/28/17   Little, Traci M, PA-C  levofloxacin (LEVAQUIN) 750 MG tablet Take 1 tablet (750 mg total) by mouth daily. Patient not taking: Reported on 06/30/2018 05/14/18   Carrie Mew, MD  methocarbamol (ROBAXIN-750) 750 MG tablet Take 1 tablet (750 mg total) by mouth every 8 (eight) hours as needed for muscle spasms. Patient not taking: Reported on 01/24/2017 10/18/15   Victorino Dike, FNP  metroNIDAZOLE (FLAGYL)  500 MG tablet Take 1 tablet (500 mg total) by mouth 2 (two) times daily. Patient not taking: Reported on 01/24/2017 02/25/16   Lisa Roca, MD  ondansetron (ZOFRAN) 4 MG tablet Take 1 tablet (4 mg total) by mouth daily as needed for nausea or vomiting. Patient not taking: Reported on 06/30/2018 11/11/15   Orbie Pyo, MD  penicillin v potassium (VEETID) 500 MG tablet Take 1 tablet (500 mg total) by mouth 4 (four) times daily. Patient not taking: Reported on 05/07/2017 01/25/17   Max Sane, MD  tetrahydrozoline 0.05 % ophthalmic solution Apply 1 drop to eye daily as needed for irritation.    [provider]    Family History Family History  Problem Relation Age of Onset  . Diabetes Mother   . Cancer Paternal Grandmother   . Cancer Paternal Grandfather     Social History Social History   Tobacco Use  . Smoking status: Current Every Day Smoker    Packs/day: 1.00    Years: 20.00    Pack years: 20.00    Types: Cigarettes  . Smokeless tobacco: Never Used  Substance Use Topics  . Alcohol use: Yes    Alcohol/week: 4.0 standard drinks    Types: 4 Cans of beer per week    Comment:  occas.   . Drug use: No     Allergies   Bee venom; Contrast media [iodinated diagnostic agents]; Metformin and related; and Lisinopril   Review of Systems Review of Systems  Cardiovascular: Positive for chest pain.  All other systems reviewed and are negative.    Physical Exam Updated Vital Signs BP (!) 158/98   Pulse 92   Temp 98.2 F (36.8 C) (Oral)   Resp 20   Ht 6' (1.829 m)   Wt 111.1 kg   SpO2 94%   BMI 33.23 kg/m   Physical Exam  Constitutional: He is oriented to person, place, and time. He appears well-developed and well-nourished.  HENT:  Head: Normocephalic and atraumatic.  Eyes: Pupils are equal, round, and reactive to light. EOM are normal.  Neck: Normal range of motion. Neck supple.  Cardiovascular: Normal rate, regular rhythm, intact distal pulses and normal pulses.  Pulmonary/Chest: Effort normal and breath sounds normal.  Abdominal: Soft. Bowel sounds are normal.  Musculoskeletal: Normal range of motion.       Right lower leg: Normal.       Left lower leg: Normal.  Neurological: He is alert and oriented to person, place, and time.  Skin: Skin is warm. Capillary refill takes less than 2 seconds.  Psychiatric: His behavior is normal. His mood appears anxious.  Nursing note and vitals reviewed.    ED Treatments / Results  Labs (all labs ordered are listed, but only abnormal results are displayed) Labs Reviewed  BASIC METABOLIC PANEL - Abnormal; Notable for the following components:      Result Value   Potassium 3.4 (*)    CO2 21 (*)    Glucose, Bld 299 (*)    All other components within normal limits  CBC - Abnormal; Notable for the following components:   WBC 12.4 (*)    All other components within normal limits  D-DIMER, QUANTITATIVE (NOT AT [facility])  I-STAT TROPONIN, ED  I-STAT TROPONIN, ED    EKG EKG Interpretation  Date/Time:  Sunday June 30 2018 19:12:43 EDT Ventricular Rate:  104 PR Interval:    QRS Duration: 96 QT  Interval:  360 QTC Calculation: 474 R Axis:  44 Text Interpretation:  Sinus tachycardia Probable left atrial enlargement Borderline repolarization abnormality Since last tracing rate faster Confirmed by Isla Pence [phone]) on 06/30/2018 7:22:47 PM   Radiology Dg Chest 2 View  Result Date: 06/30/2018 CLINICAL DATA:  Left chest pain EXAM: CHEST - 2 VIEW COMPARISON:  May 13, 2018 FINDINGS: The heart size and mediastinal contours are within normal limits. There is minor scar of the left lung base. There is no focal infiltrate, pulmonary edema, or pleural effusion. The visualized skeletal structures are unremarkable. IMPRESSION: No active cardiopulmonary disease. Electronically Signed   By: Abelardo Diesel M.D.   On: 06/30/2018 19:54    Procedures Procedures (including critical care time)  Medications Ordered in ED Medications  ondansetron (ZOFRAN) injection 4 mg (4 mg Intravenous Given 06/30/18 2007)  morphine 4 MG/ML injection 4 mg (4 mg Intravenous Given 06/30/18 2011)  LORazepam (ATIVAN) injection 1 mg (1 mg Intravenous Given 06/30/18 2009)  ondansetron (ZOFRAN) injection 4 mg (4 mg Intravenous Given 06/30/18 2135)  morphine 4 MG/ML injection 4 mg (4 mg Intravenous Given 06/30/18 2137)  LORazepam (ATIVAN) injection 1 mg (1 mg Intravenous Given 06/30/18 2136)  nitroGLYCERIN 50 mg in dextrose 5 % 250 mL (0.2 mg/mL) infusion (30 mcg/min Intravenous Rate/Dose Change 06/30/18 2257)     Initial Impression / Assessment and Plan / ED Course  I have reviewed the triage vital signs and the nursing notes.  Pertinent labs & imaging results that were available during my care of the patient were reviewed by me and considered in my medical decision making (see chart for details).  Pt still with cp, so he was started on a nitro drip.  Pt d/w Dr. Blaine Hamper (triad) for observation.   Final Clinical Impressions(s) / ED Diagnoses   Final diagnoses:  Chest pain, unspecified type    ED Discharge Orders     None       Isla Pence, MD 06/30/18 2309

## 2018-06-30 NOTE — ED Triage Notes (Signed)
Patient arrived from home by EMS with Left chest pain that radiates to his jaw and left arm. He is diaphoretic, and reports short of breath.  EMS gave 324mg  ASA, and 2 Nitro(BP was initially 200/140; down to 130/56 after second Nitro)

## 2018-06-30 NOTE — H&P (Addendum)
History and Physical    Gotti Alwin ZOX:[id number] DOB: [birth year] DOA: 06/30/2018  Referring MD/NP/PA:   PCP: Venida Jarvis, MD   Patient coming from:  The patient is coming from home.  At baseline, pt is independent for most of ADL.  Chief Complaint: chest pain  HPI: Caleb [name] is a 42 y.o. male with medical history significant of hypertension, hyperlipidemia, diabetes mellitus, stroke, GERD, anxiety, PTSD, PE (provoked PE after knee surgery 6 years ago), tobacco abuse, CAD, who presents with chest pain.  Patient states that he started having chest pain at about 5:30 PM.  Chest pain is located in the left side of chest, constant, 10 out of 10 severity initially, currently 7 out of 10 severity, pressure-like, radiating to the jaw and left arm.  It is associated with shortness of breath, diaphoresis.  No cough, fever or chills.  Patient has nausea, no vomiting, diarrhea or abdominal pain.  Denies symptoms of UTI.  Patient states that he had mechanical fall accidentally 2 weeks ago, and had head injury.  Currently no neck pain or headache.  No unilateral weakness or numbness in extremities.  No recent long distant traveling. Pt states that he had negative stress test 4 years ago.  ED Course: pt was found to have negative d-dimer, negative troponin, WBC 12.4, potassium 3.4, creatinine normal, temperature normal, tachycardia, tachypnea, oxygen sat 90 to 94% on room air, negative chest x-ray.  Patient is placed on stepdown bed for observation.  Nitroglycerin drip was started.  Review of Systems:   General: no fevers, chills, no body weight gain, has fatigue HEENT: no blurry vision, hearing changes or sore throat Respiratory: has dyspnea, no coughing, wheezing CV: has chest pain, no palpitations GI: has nausea, no vomiting, abdominal pain, diarrhea, constipation GU: no dysuria, burning on urination, increased urinary frequency, hematuria  Ext: no leg edema Neuro: no  unilateral weakness, numbness, or tingling, no vision change or hearing loss. Had fall. Skin: no rash, no skin tear. MSK: No muscle spasm, no deformity, no limitation of range of movement in spin Heme: No easy bruising.  Travel history: No recent long distant travel.  Allergy:  Allergies  Allergen Reactions  . Bee Venom Anaphylaxis  . Contrast Media [Iodinated Diagnostic Agents] Anaphylaxis  . Metformin And Related Anaphylaxis    Unknown   . Lisinopril Swelling    Past Medical History:  Diagnosis Date  . Anxiety   . Diabetes mellitus without complication ([place])   . GERD (gastroesophageal reflux disease)   . History of degenerative disc disease   . Hypercholesteremia   . Hypertension   . Knee pain, bilateral   . PTSD (post-traumatic stress disorder)   . Pulmonary embolism ([place])   . Stroke [facility]) 2015   mold stroke per patient    Past Surgical History:  Procedure Laterality Date  . COLONOSCOPY WITH PROPOFOL N/A 05/07/2017   Procedure: COLONOSCOPY WITH PROPOFOL;  Surgeon: Lollie Sails, MD;  Location: [facility] ENDOSCOPY;  Service: Endoscopy;  Laterality: N/A;  . ESOPHAGOGASTRODUODENOSCOPY (EGD) WITH PROPOFOL N/A 05/07/2017   Procedure: ESOPHAGOGASTRODUODENOSCOPY (EGD) WITH PROPOFOL;  Surgeon: Lollie Sails, MD;  Location: [facility] ENDOSCOPY;  Service: Endoscopy;  Laterality: N/A;  . HERNIA REPAIR    . Knee surgeries    . TONSILLECTOMY      Social History:  reports that he has been smoking cigarettes. He has a 20.00 pack-year smoking history. He has never used smokeless tobacco. He reports that he drinks about 4.0 standard drinks  of alcohol per week. He reports that he does not use drugs.  Family History:  Family History  Problem Relation Age of Onset  . Diabetes Mother   . Cancer Paternal Grandmother   . Cancer Paternal Grandfather      Prior to Admission medications   Medication Sig Start Date End Date Taking? Authorizing Provider  albuterol (PROVENTIL HFA;VENTOLIN  HFA) 108 (90 Base) MCG/ACT inhaler Inhale 2 puffs into the lungs every 6 (six) hours as needed for shortness of breath. 06/13/16  Yes [provider]  aspirin EC 81 MG tablet Take 81 mg by mouth daily.   Yes [provider]  atorvastatin (LIPITOR) 80 MG tablet Take 80 mg by mouth at bedtime.  03/04/15  Yes [provider]  cetirizine (ZYRTEC) 10 MG tablet Take 10 mg by mouth daily.   Yes [provider]  diazepam (VALIUM) 5 MG tablet Take 5 mg by mouth at bedtime.  01/14/15  Yes [provider]  Diphenhyd-Hydrocort-Nystatin (FIRST-DUKES MOUTHWASH) SUSP Use as directed 5 mLs in the mouth or throat 3 (three) times daily. 05/14/18  Yes Carrie Mew, MD  diphenhydrAMINE (BENADRYL) 25 mg capsule Take 50 mg by mouth every 6 (six) hours as needed for itching or allergies.   Yes [provider]  esomeprazole (NEXIUM) 40 MG capsule Take 40 mg by mouth daily. 06/13/16 06/30/18 Yes [provider]  fluticasone (FLONASE) 50 MCG/ACT nasal spray Place 2 sprays into the nose daily.   Yes [provider]  hydrOXYzine (VISTARIL) 50 MG capsule Take 50 mg by mouth daily. 06/04/17  Yes [provider]  liraglutide (VICTOZA) 18 MG/3ML SOPN Inject 1.2 mg into the skin daily. 06/06/18  Yes [provider]  losartan-hydrochlorothiazide (HYZAAR) 100-25 MG per tablet Take 1 tablet by mouth daily. 02/25/15  Yes [provider]  metoprolol succinate (TOPROL-XL) 50 MG 24 hr tablet Take 50 mg by mouth daily. 12/24/17  Yes [provider]  naproxen (NAPROSYN) 500 MG tablet Take 1 tablet (500 mg total) by mouth 2 (two) times daily with a meal. 04/22/17  Yes Triplett, Cari B, FNP  oxyCODONE (ROXICODONE) 15 MG immediate release tablet Take 15 mg by mouth 5 (five) times daily. For pain. 09/25/15  Yes [provider]  potassium chloride SA (KLOR-CON M20) 20 MEQ tablet Take 20 mEq by mouth daily.   Yes [provider]    pregabalin (LYRICA) 75 MG capsule Take 75 mg by mouth 3 (three) times daily as needed (Pain).  06/13/16 06/30/18 Yes [provider]  clotrimazole (MYCELEX) 10 MG troche Take 1 tablet (10 mg total) by mouth 5 (five) times daily. Patient not taking: Reported on 01/24/2017 07/18/16   Menshew, Dannielle Karvonen, PA-C  cyclobenzaprine (FLEXERIL) 10 MG tablet Take 1 tablet (10 mg total) by mouth 3 (three) times daily as needed for muscle spasms. Patient not taking: Reported on 05/07/2017 04/22/17   Sherrie George B, FNP  diclofenac sodium (VOLTAREN) 1 % GEL Apply 2 g topically daily as needed (pain).     [provider]  erythromycin ophthalmic ointment Place 1 application into the left eye 4 (four) times daily. Patient not taking: Reported on 05/07/2017 03/28/17   Little, Traci M, PA-C  gabapentin (NEURONTIN) 100 MG capsule Take 300 mg by mouth at bedtime. 08/24/15   [provider]  guaiFENesin (ROBITUSSIN) 100 MG/5ML SOLN Take 5 mLs (100 mg total) by mouth every 4 (four) hours as needed for cough or to loosen phlegm.  05/14/18   Carrie Mew, MD  ketorolac (ACULAR) 0.5 % ophthalmic solution Place 1 drop into the left eye 4 (four) times daily. Patient not taking: Reported on 05/07/2017 03/28/17   Little, Traci M, PA-C  levofloxacin (LEVAQUIN) 750 MG tablet Take 1 tablet (750 mg total) by mouth daily. Patient not taking: Reported on 06/30/2018 05/14/18   Carrie Mew, MD  methocarbamol (ROBAXIN-750) 750 MG tablet Take 1 tablet (750 mg total) by mouth every 8 (eight) hours as needed for muscle spasms. Patient not taking: Reported on 01/24/2017 10/18/15   Victorino Dike, FNP  metroNIDAZOLE (FLAGYL) 500 MG tablet Take 1 tablet (500 mg total) by mouth 2 (two) times daily. Patient not taking: Reported on 01/24/2017 02/25/16   Lisa Roca, MD  ondansetron (ZOFRAN) 4 MG tablet Take 1 tablet (4 mg total) by mouth daily as needed for nausea or vomiting. Patient not taking: Reported on 06/30/2018  11/11/15   Orbie Pyo, MD  penicillin v potassium (VEETID) 500 MG tablet Take 1 tablet (500 mg total) by mouth 4 (four) times daily. Patient not taking: Reported on 05/07/2017 01/25/17   Max Sane, MD  tetrahydrozoline 0.05 % ophthalmic solution Apply 1 drop to eye daily as needed for irritation.    [provider]    Physical Exam: Vitals:   07/01/18 0116 07/01/18 0200 07/01/18 0300 07/01/18 0400  BP: 119/82 125/65 126/60 120/61  Pulse: 93 92 90 79  Resp: (!) 26 (!) 25 18 12   Temp: 97.8 F (36.6 C)     TempSrc: Oral     SpO2: 96% 93% 96% 92%  Weight: 107.3 kg     Height: 6' (1.829 m)      General: Not in acute distress. Pt is anxious. HEENT:       Eyes: PERRL, EOMI, no scleral icterus.       ENT: No discharge from the ears and nose, no pharynx injection, no tonsillar enlargement.        Neck: No JVD, no bruit, no mass felt. Heme: No neck lymph node enlargement. Cardiac: S1/S2, RRR, No murmurs, No gallops or rubs. Respiratory: No rales, wheezing, rhonchi or rubs. GI: Soft, nondistended, nontender, no rebound pain, no organomegaly, BS present. GU: No hematuria Ext: No pitting leg edema bilaterally. 2+DP/PT pulse bilaterally. Musculoskeletal: No joint deformities, No joint redness or warmth, no limitation of ROM in spin. Skin: No rashes.  Neuro: Alert, oriented X3, cranial nerves II-XII grossly intact, moves all extremities normally.  Psych: Patient is not psychotic, no suicidal or hemocidal ideation.  Labs on Admission: I have personally reviewed following labs and imaging studies  CBC: Recent Labs  Lab 06/30/18 1943  WBC 12.4*  HGB 15.9  HCT 45.0  MCV 88.4  PLT 564   Basic Metabolic Panel: Recent Labs  Lab 06/30/18 1943  NA 138  K 3.4*  CL 102  CO2 21*  GLUCOSE 299*  BUN 9  CREATININE 0.89  CALCIUM 9.6   GFR: Estimated Creatinine Clearance: 138.3 mL/min (by C-G formula based on SCr of 0.89 mg/dL). Liver Function Tests: No results  for input(s): AST, ALT, ALKPHOS, BILITOT, PROT, ALBUMIN in the last 168 hours. No results for input(s): LIPASE, AMYLASE in the last 168 hours. No results for input(s): AMMONIA in the last 168 hours. Coagulation Profile: No results for input(s): INR, PROTIME in the last 168 hours. Cardiac Enzymes: Recent Labs  Lab 07/01/18 0000 07/01/18 0128  TROPONINI <0.03 <0.03   BNP (last 3 results) No results  for input(s): PROBNP in the last 8760 hours. HbA1C: No results for input(s): HGBA1C in the last 72 hours. CBG: No results for input(s): GLUCAP in the last 168 hours. Lipid Profile: Recent Labs    07/01/18 0128  CHOL 228*  HDL 36*  LDLCALC 140*  TRIG 259*  CHOLHDL 6.3   Thyroid Function Tests: No results for input(s): TSH, T4TOTAL, FREET4, T3FREE, THYROIDAB in the last 72 hours. Anemia Panel: No results for input(s): VITAMINB12, FOLATE, FERRITIN, TIBC, IRON, RETICCTPCT in the last 72 hours. Urine analysis:    Component Value Date/Time   COLORURINE YELLOW 07/01/2018 0004   APPEARANCEUR CLEAR 07/01/2018 0004   APPEARANCEUR Clear 12/23/2014 1855   LABSPEC 1.030 07/01/2018 0004   LABSPEC 1.011 12/23/2014 1855   PHURINE 5.0 07/01/2018 0004   GLUCOSEU >=500 (A) 07/01/2018 0004   GLUCOSEU Negative 12/23/2014 1855   HGBUR NEGATIVE 07/01/2018 0004   BILIRUBINUR NEGATIVE 07/01/2018 0004   BILIRUBINUR Negative 12/23/2014 1855   KETONESUR NEGATIVE 07/01/2018 0004   PROTEINUR NEGATIVE 07/01/2018 0004   UROBILINOGEN 0.2 12/07/2009 1204   NITRITE NEGATIVE 07/01/2018 0004   LEUKOCYTESUR NEGATIVE 07/01/2018 0004   LEUKOCYTESUR Negative 12/23/2014 1855   Sepsis Labs: @LABRCNTIP (procalcitonin:4,lacticidven:4) ) Recent Results (from the past 240 hour(s))  MRSA PCR Screening     Status: None   Collection Time: 07/01/18  1:20 AM  Result Value Ref Range Status   MRSA by PCR NEGATIVE NEGATIVE Final    Comment:        The GeneXpert MRSA Assay (FDA approved for NASAL specimens only), is  one component of a comprehensive MRSA colonization surveillance program. It is not intended to diagnose MRSA infection nor to guide or monitor treatment for MRSA infections. Performed at [facility], [place] [address] [place], [place] [address]      Radiological Exams on Admission: Dg Chest 2 View  Result Date: 06/30/2018 CLINICAL DATA:  Left chest pain EXAM: CHEST - 2 VIEW COMPARISON:  May 13, 2018 FINDINGS: The heart size and mediastinal contours are within normal limits. There is minor scar of the left lung base. There is no focal infiltrate, pulmonary edema, or pleural effusion. The visualized skeletal structures are unremarkable. IMPRESSION: No active cardiopulmonary disease. Electronically Signed   By: Abelardo Diesel M.D.   On: 06/30/2018 19:54   Ct Head Wo Contrast  Result Date: 07/01/2018 CLINICAL DATA:  42 y/o M; headache, nausea, anxiousness, left hand weakness. EXAM: CT HEAD WITHOUT CONTRAST TECHNIQUE: Contiguous axial images were obtained from the base of the skull through the vertex without intravenous contrast. COMPARISON:  09/14/2016 CT head. FINDINGS: Brain: No evidence of acute infarction, hemorrhage, hydrocephalus, extra-axial collection or mass lesion/mass effect. Vascular: No hyperdense vessel or unexpected calcification. Skull: Normal. Negative for fracture or focal lesion. Sinuses/Orbits: Mild frontal, sphenoid, and maxillary sinus mucosal thickening and partial opacification of the ethmoid air cells, similar to the prior CT of the head. Normal aeration of mastoid air cells. Orbits are unremarkable. Other: None. IMPRESSION: 1. No acute intracranial abnormality. Stable unremarkable CT of the brain. 2. Stable paranasal sinus disease greatest in ethmoid air cells. Electronically Signed   By: Kristine Garbe M.D.   On: 07/01/2018 00:43     EKG: Independently reviewed.  Sinus rhythm, QTC 474, mild T wave inversions/ST depression in V3-V6 and inferior  leads.  Assessment/Plan Principal Problem:   Chest pain Active Problems:   Anxiety   Diabetes mellitus without complication (HCC)   GERD (gastroesophageal reflux disease)   Hypertension  Hypercholesteremia   Stroke [facility])   Hypertensive urgency   Hypokalemia   Fall   Leukocytosis   Chest pain: Patient has persistent severe chest pain, concerning for unstable angina, but he is very anxious, which lowered possibility of unstable angina.  D-dimer negative, less likely to have PE.  Patient may have demand ischemia secondary to hypertensive urgency.  - will admit to tele bed as inpt - will place on Tele bed for obs - cycle CE q6 x3 and repeat EKG in the am  - IV Nitroglycerin gtt - prn dilaudid, and aspirin, lipitor, metoprolol - Risk factor stratification: will check FLP, UDS and A1C  - 2d echo - inpt card consult was requested via Epic  Anxiety: - continue home Valium -give 1 mg of Ativan before CT-head  Diabetes mellitus without complication ([place]): Last A1c 5.9 on 01/2017, well controled. Patient is taking Victoza at home -SSI  GERD: -Protonix  HTN and Hypertensive urgency: Bp 170/107-->158/98 -Continue home medications: Hyzaar, metoprolol -IV hydralazine prn  Hypercholesteremia: -lipitor  Stroke ([place]): -ASA and lipitor  Hypokalemia: K=3.4 on admission. - Repleted  Fall: states that he had head injury -CT-head-->negative for acute intracranial abnormalities.  Leukocytosis: no signs of infection. Likely due to stress induced to demargination.  Chest x-ray negative. -will follow up blood and UA    Inpatient status:  # Patient requires inpatient status due to high intensity of service, high risk for further deterioration and high frequency of surveillance required.  I certify that at the point of admission it is my clinical judgment that the patient will require inpatient hospital care spanning beyond 2 midnights from the point of admission.  . This patient  has multiple chronic comorbidities including  hypertension, hyperlipidemia, diabetes mellitus, stroke, PTSD, PE, tobacco abuse, CAD, who presents with chest pain. . Now patient has presenting symptoms include persistent chest pain, may need Cardiac cath . The initial radiographic and laboratory data are worrisome because of TWI and ST depression on EKG . Current medical needs: please see my assessment and plan    DVT ppx: SQ Lovenox Code Status: Full code Family Communication:  Yes, patient's wife at bed side Disposition Plan:  Anticipate discharge back to previous home environment Consults called:  none Admission status: Obs / tele    Date of Service 07/01/2018    Ivor Costa [facility] Pager [phone]  If 7PM-7AM, please contact night-coverage [email or web address] Password [facility] 07/01/2018, 7:03 AM

## 2018-06-30 NOTE — ED Notes (Signed)
D-dimmer added to blood in main lab

## 2018-06-30 NOTE — ED Notes (Signed)
RN found that IV pump had been silenced. Wife admits to silencing the pump.  RN educated pt and family member on dangers of them adjusting the pump.  RN instructed them to call out should the alarm go off not to touch the pump.  This is the second time this has happened.

## 2018-07-01 ENCOUNTER — Other Ambulatory Visit: Payer: Self-pay

## 2018-07-01 ENCOUNTER — Observation Stay (HOSPITAL_COMMUNITY): Payer: Medicare HMO

## 2018-07-01 ENCOUNTER — Ambulatory Visit (HOSPITAL_BASED_OUTPATIENT_CLINIC_OR_DEPARTMENT_OTHER): Payer: Medicare HMO

## 2018-07-01 ENCOUNTER — Encounter (HOSPITAL_COMMUNITY): Payer: Self-pay | Admitting: Radiology

## 2018-07-01 DIAGNOSIS — E119 Type 2 diabetes mellitus without complications: Secondary | ICD-10-CM | POA: Diagnosis not present

## 2018-07-01 DIAGNOSIS — E7800 Pure hypercholesterolemia, unspecified: Secondary | ICD-10-CM | POA: Diagnosis not present

## 2018-07-01 DIAGNOSIS — R079 Chest pain, unspecified: Secondary | ICD-10-CM

## 2018-07-01 DIAGNOSIS — D72829 Elevated white blood cell count, unspecified: Secondary | ICD-10-CM | POA: Diagnosis present

## 2018-07-01 DIAGNOSIS — I200 Unstable angina: Secondary | ICD-10-CM

## 2018-07-01 DIAGNOSIS — F419 Anxiety disorder, unspecified: Secondary | ICD-10-CM | POA: Diagnosis not present

## 2018-07-01 LAB — URINALYSIS, ROUTINE W REFLEX MICROSCOPIC
Bilirubin Urine: NEGATIVE
Glucose, UA: >=500 mg/dL — AB
Hgb urine dipstick: NEGATIVE
Ketones, ur: NEGATIVE mg/dL
Leukocytes, UA: NEGATIVE
Nitrite: NEGATIVE
Protein, ur: NEGATIVE mg/dL
Specific Gravity, Urine: 1.03 (ref 1.005–1.030)
pH: 5 (ref 5.0–8.0)

## 2018-07-01 LAB — RAPID URINE DRUG SCREEN, HOSP PERFORMED
Amphetamines: NOT DETECTED
Barbiturates: NOT DETECTED
Benzodiazepines: POSITIVE — AB
Cocaine: NOT DETECTED
Opiates: POSITIVE — AB
TETRAHYDROCANNABINOL: NOT DETECTED

## 2018-07-01 LAB — HIV ANTIBODY (ROUTINE TESTING W REFLEX): HIV Screen 4th Generation wRfx: NONREACTIVE

## 2018-07-01 LAB — GLUCOSE, CAPILLARY
GLUCOSE-CAPILLARY: 214 mg/dL — AB (ref 70–99)
Glucose-Capillary: 159 mg/dL — ABNORMAL HIGH (ref 70–99)
Glucose-Capillary: 152 mg/dL — ABNORMAL HIGH (ref 70–99)
Glucose-Capillary: 222 mg/dL — ABNORMAL HIGH (ref 70–99)

## 2018-07-01 LAB — TROPONIN I
Troponin I: <0.03 ng/mL
Troponin I: <0.03 ng/mL
Troponin I: <0.03 ng/mL

## 2018-07-01 LAB — ECHOCARDIOGRAM COMPLETE
Height: 72 in
Weight: 3784 [oz_av]

## 2018-07-01 LAB — MRSA PCR SCREENING: MRSA by PCR: NEGATIVE

## 2018-07-01 LAB — LIPID PANEL
CHOL/HDL RATIO: 6.3 ratio
Cholesterol: 228 mg/dL — ABNORMAL HIGH (ref 0–200)
HDL: 36 mg/dL — AB (ref >40)
LDL Cholesterol: 140 mg/dL — ABNORMAL HIGH (ref 0–99)
TRIGLYCERIDES: 259 mg/dL — AB (ref <150)
VLDL: 52 mg/dL — AB (ref 0–40)

## 2018-07-01 MED ORDER — TRAMADOL HCL 50 MG PO TABS: 50.0000 mg | ORAL_TABLET | Freq: Four times a day (QID) | ORAL | Status: DC | PRN

## 2018-07-01 MED ORDER — SENNOSIDES-DOCUSATE SODIUM 8.6-50 MG PO TABS
2.0000 | ORAL_TABLET | Freq: Every evening | ORAL | Status: DC | PRN
  Administered 2018-07-01: 2 via ORAL
  Filled 2018-07-01: qty 2

## 2018-07-01 MED ORDER — PREDNISONE 50 MG PO TABS
50.0000 mg | ORAL_TABLET | Freq: Four times a day (QID) | ORAL | Status: AC
  Administered 2018-07-01 – 2018-07-02 (×3): 50 mg via ORAL
  Filled 2018-07-01 (×3): qty 1

## 2018-07-01 MED ORDER — ALBUTEROL SULFATE (2.5 MG/3ML) 0.083% IN NEBU: 2.5000 mg | INHALATION_SOLUTION | RESPIRATORY_TRACT | Status: DC | PRN

## 2018-07-01 MED ORDER — DIPHENHYDRAMINE HCL 25 MG PO CAPS
50.0000 mg | ORAL_CAPSULE | Freq: Once | ORAL | Status: AC
  Administered 2018-07-02: 50 mg via ORAL
  Filled 2018-07-01: qty 2

## 2018-07-01 MED ORDER — HYDROCHLOROTHIAZIDE 25 MG PO TABS
25.0000 mg | ORAL_TABLET | Freq: Every day | ORAL | Status: DC
  Administered 2018-07-01 – 2018-07-02 (×2): 25 mg via ORAL
  Filled 2018-07-01 (×2): qty 1

## 2018-07-01 MED ORDER — POTASSIUM CHLORIDE CRYS ER 20 MEQ PO TBCR
40.0000 meq | EXTENDED_RELEASE_TABLET | Freq: Once | ORAL | Status: AC
  Administered 2018-07-01: 40 meq via ORAL
  Filled 2018-07-01: qty 2

## 2018-07-01 MED ORDER — LORAZEPAM 2 MG/ML IJ SOLN
0.5000 mg | INTRAMUSCULAR | Status: DC | PRN
Start: 2018-07-01 — End: 2018-07-02
  Administered 2018-07-01 – 2018-07-02 (×4): 1 mg via INTRAVENOUS
  Filled 2018-07-01 (×4): qty 1

## 2018-07-01 MED ORDER — LOSARTAN POTASSIUM 50 MG PO TABS
100.0000 mg | ORAL_TABLET | Freq: Every day | ORAL | Status: DC
  Administered 2018-07-01: 100 mg via ORAL
  Filled 2018-07-01: qty 2

## 2018-07-01 MED ORDER — DIPHENHYDRAMINE HCL 50 MG/ML IJ SOLN: 50.0000 mg | Freq: Once | INTRAMUSCULAR | Status: AC

## 2018-07-01 NOTE — ED Notes (Signed)
Delay in lab draw,  Pt enroute to CT. 

## 2018-07-01 NOTE — ED Notes (Signed)
Patient transported to CT 

## 2018-07-01 NOTE — Plan of Care (Signed)

## 2018-07-01 NOTE — Progress Notes (Signed)
[facility] TEAM 1 - Stepdown/ICU TEAM  Marshell Dilauro  EXH:[id number] DOB: 12-Feb-1976 DOA: 06/30/2018 PCP: Venida Jarvis, MD    Brief Narrative:  42 y.o. male with a hx of hypertension, hyperlipidemia, diabetes mellitus, stroke, GERD, anxiety, PTSD, PE (provoked PE after knee surgery 6 years ago), tobacco abuse, and CAD who presented with chest pain associated with shortness of breath and diaphoresis.   In the ED the pt was found to have a negative d-dimer, negative troponin, WBC 12.4, potassium 3.4, creatinine normal, temperature normal, tachycardia, tachypnea, oxygen sat 90 to 94% on room air, and a negative chest x-ray.    Significant Events: 8/11 admit   Subjective: C/o ongoing severe chest pain, with associated mild SOB.  Appears very anxious.    Assessment & Plan:  Chest pain D-dimer negative - EKG abnormal w/ T wave inversions in anterior leads - troponin not significantly elevated - Cards planning for Coronary CTa w/ premedication due to hives reporte w/ IV contrast  Recent Labs  Lab 07/01/18 0000 07/01/18 0128 07/01/18 1044  TROPONINI <0.03 <0.03 <0.03    Anxiety Short term use of benzos for situation anxiety - will NOT prescribe for use at D/C   DM2 A1c 5.9 01/2017 - follow w/o change in tx today   GERD Protonix  HTN BP now well controlled/borderline low   Hypercholesteremia Lipitor - LDL 140  Hx of Stroke  ASA and lipitor  Hypokalemia F/u in AM   Recent accidental Fall Stated he had a head injury - CT head negative for acute intracranial abnormalities   DVT prophylaxis: lovenox  Code Status: FULL CODE Family Communication: no family present at time of exam  Disposition Plan: tele bed - cardiac w/u per Cards   Consultants:  [facility]   Antimicrobials:  none   Objective: Blood pressure 108/62, pulse 72, temperature (!) 97.5 F (36.4 C), temperature source Oral, resp. rate 12, height 6' (1.829 m), weight 107.3 kg, SpO2  92 %.  Intake/Output Summary (Last 24 hours) at 07/01/2018 1822 Last data filed at 07/01/2018 1400 Gross per 24 hour  Intake 184.57 ml  Output 251 ml  Net -66.43 ml   Filed Weights   06/30/18 1909 07/01/18 0116  Weight: 111.1 kg 107.3 kg    Examination: General: No acute respiratory distress Lungs: Clear to auscultation bilaterally without wheezes or crackles Cardiovascular: Regular rate and rhythm without murmur gallop or rub normal S1 and S2 Abdomen: Nontender, nondistended, soft, bowel sounds positive, no rebound, no ascites, no appreciable mass Extremities: No significant cyanosis, clubbing, or edema bilateral lower extremities  CBC: Recent Labs  Lab 06/30/18 1943  WBC 12.4*  HGB 15.9  HCT 45.0  MCV 88.4  PLT 381   Basic Metabolic Panel: Recent Labs  Lab 06/30/18 1943  NA 138  K 3.4*  CL 102  CO2 21*  GLUCOSE 299*  BUN 9  CREATININE 0.89  CALCIUM 9.6   GFR: Estimated Creatinine Clearance: 138.3 mL/min (by C-G formula based on SCr of 0.89 mg/dL).  Liver Function Tests: No results for input(s): AST, ALT, ALKPHOS, BILITOT, PROT, ALBUMIN in the last 168 hours. No results for input(s): LIPASE, AMYLASE in the last 168 hours. No results for input(s): AMMONIA in the last 168 hours.  Coagulation Profile: No results for input(s): INR, PROTIME in the last 168 hours.  Cardiac Enzymes: Recent Labs  Lab 07/01/18 0000 07/01/18 0128 07/01/18 1044  TROPONINI <0.03 <0.03 <0.03    HbA1C: Hemoglobin A1C  Date/Time Value Ref  Range Status  11/10/2014 05:11 AM 5.4 4.2 - 6.3 % Final    Comment:    The American Diabetes Association recommends that a primary goal of therapy should be <7% and that physicians should reevaluate the treatment regimen in patients with HbA1c values consistently >8%.    Hgb A1c MFr Bld  Date/Time Value Ref Range Status  01/24/2017 06:09 PM 5.9 (H) 4.8 - 5.6 % Final    Comment:    (NOTE)         Pre-diabetes: 5.7 - 6.4          Diabetes: >6.4         Glycemic control for adults with diabetes: <7.0     CBG: Recent Labs  Lab 07/01/18 0835 07/01/18 1216 07/01/18 1714  GLUCAP 159* 152* 222*    Recent Results (from the past 240 hour(s))  MRSA PCR Screening     Status: None   Collection Time: 07/01/18  1:20 AM  Result Value Ref Range Status   MRSA by PCR NEGATIVE NEGATIVE Final    Comment:        The GeneXpert MRSA Assay (FDA approved for NASAL specimens only), is one component of a comprehensive MRSA colonization surveillance program. It is not intended to diagnose MRSA infection nor to guide or monitor treatment for MRSA infections. Performed at [facility], [place] [address] [place], [place] [address]      Scheduled Meds: . aspirin EC  325 mg Oral Daily  . atorvastatin  80 mg Oral QHS  . B-complex with vitamin C  1 tablet Oral Daily  . baclofen  10 mg Oral QHS  . diphenhydrAMINE  50 mg Oral Once   Or  . diphenhydrAMINE  50 mg Intravenous Once  . enoxaparin (LOVENOX) injection  40 mg Subcutaneous QHS  . fluticasone  2 spray Each Nare Daily  . losartan  100 mg Oral Daily   And  . hydrochlorothiazide  25 mg Oral Daily  . insulin aspart  0-9 Units Subcutaneous TID WC  . magic mouthwash  5 mL Oral TID  . metoprolol succinate  50 mg Oral Daily  . montelukast  4 mg Oral QHS  . naproxen  500 mg Oral BID WC  . nicotine  21 mg Transdermal Daily  . oxyCODONE  15 mg Oral 5 X Daily  . pantoprazole  40 mg Oral Daily  . predniSONE  50 mg Oral Q6H    LOS: 0 days   Cherene Altes, MD [facility] Office  [phone] Pager - Text Page per Amion  If 7PM-7AM, please contact night-coverage per [facility] 07/01/2018, 6:22 PM

## 2018-07-01 NOTE — Progress Notes (Signed)
Pt states he has not had resolution of pain and it is not radiating, pt had just been asleep and awakened with verbal stimulation, will watch, given scheduled pain med.

## 2018-07-01 NOTE — Progress Notes (Signed)
Inpatient Diabetes Program Recommendations  AACE/ADA: New Consensus Statement on Inpatient Glycemic Control (2019)  Target Ranges:  Prepandial:   less than 140 mg/dL      Peak postprandial:   less than 180 mg/dL (1-2 hours)      Critically ill patients:  140 - 180 mg/dL   Results for Caleb [name], Caleb [name] (MRN [id number]) as of 07/01/2018 09:51  Ref. Range 07/01/2018 08:35  Glucose-Capillary Latest Ref Range: 70 - 99 mg/dL 159 (H)   Review of Glycemic Control  Diabetes history: DM2 Outpatient Diabetes medications: Victoza 1.2 mg daily Current orders for Inpatient glycemic control: Novolog 0-9 units TID with meals  Inpatient Diabetes Program Recommendations: Correction (SSI): Please consider ordering Novolog 0-5 units QHS for bedtime correction. HgbA1C: A1C in process.   Thanks, Barnie Alderman, RN, MSN, CDE Diabetes Coordinator Inpatient Diabetes Program [phone] (Team Pager from 8am to 5pm)

## 2018-07-01 NOTE — Progress Notes (Signed)
   07/01/18 1126  Vitals  Temp 97.7 F (36.5 C)  Temp Source Oral  BP (!) 88/57  MAP (mmHg) 67  BP Location Right Arm  BP Method Automatic  Patient Position (if appropriate) Lying  Pulse Rate 69  Pulse Rate Source Monitor  ECG Heart Rate 66  Cardiac Rhythm NSR  Resp (!) 8  Oxygen Therapy  SpO2 92 %  O2 Device Room Air  Pre-WUA / WUA Start  [place] Agitation Sedation Scale (RASS) -1  RASS Goal 0  Pain Assessment  Pain Scale 0-10  Pain Score 0  POSS Scale ([place] Opioid Sedation Scale)  POSS *See Group Information* S-Acceptable,Sleep, easy to arouse  Glasgow Coma Scale  Eye Opening 3  Best Verbal Response (NON-intubated) 5  Best Motor Response 6  [place]asgow Coma Scale Score 14  decreased nitro gtt to 57mcg/min

## 2018-07-01 NOTE — Consult Note (Addendum)
Cardiology Consultation:   Caleb [name] ID: Caleb [name]; [id number]; 1975-12-19   Admit date: 06/30/2018 Date of Consult: 07/01/2018  Primary Care Provider: Venida Jarvis, MD Primary Cardiologist: Kathlyn Sacramento, MD  Primary Electrophysiologist:  N/A   Caleb [name] Profile:   Caleb [name] is a 42 y.o. [name] with a hx of DM II,  who is being seen today for the evaluation of chest pain at the request of Dr. Thereasa Solo.  [name] of Present Illness:   Caleb [name] with past medical [name] of DM 2, hypertension, hyperlipidemia, and CVA.  Although [name] of PE was listed as part of past medical [name], I do not see any previous work-up to indicate presence of PE.  Caleb [name] was last seen by Dr. Fletcher Anon in March 2018 for chest pain.  Based on the previous note, Caleb [name] had a cardiac catheterization in 2014 by Dr. Saralyn Pilar which showed minor irregularities with no significant obstructive disease.  Cardiac catheterization was performed due to abnormal EKG.  During the last hospitalization, Dr. Fletcher Anon offered him both stress test versus cardiac catheterization, Caleb [name] did not want either one.  Echocardiogram obtained on 01/25/2018 showed EF 55 to 60%, trivial aortic regurgitation.  Since then Caleb [name] has been doing well.  According to Caleb [name], Caleb [name] has significant anxiety, PTSD and is currently on [name].  Caleb [name] does not do much strenuous activity.  Yesterday while driving home from a event, Caleb [name] started having left-sided chest pain radiating down the left arm.  It lasted a total of more than an hour before finally subsiding.  Caleb [name] eventually sought medical attention at [facility].  Serial troponin was negative x2.  EKG showed mild anterior TWI.  Cardiology has been consulted for chest pain.   Past Medical [name]:  Diagnosis Date  . Anxiety   . Diabetes mellitus without complication ([place])   . GERD (gastroesophageal reflux disease)   . [name] of degenerative disc  disease   . Hypercholesteremia   . Hypertension   . Knee pain, bilateral   . PTSD (post-traumatic stress disorder)   . Pulmonary embolism ([place])   . Stroke [facility]) 2015   mold stroke per Caleb [name]    Past Surgical [name]:  Procedure Laterality Date  . COLONOSCOPY WITH PROPOFOL N/A 05/07/2017   Procedure: COLONOSCOPY WITH PROPOFOL;  Surgeon: Lollie Sails, MD;  Location: [facility] ENDOSCOPY;  Service: Endoscopy;  Laterality: N/A;  . ESOPHAGOGASTRODUODENOSCOPY (EGD) WITH PROPOFOL N/A 05/07/2017   Procedure: ESOPHAGOGASTRODUODENOSCOPY (EGD) WITH PROPOFOL;  Surgeon: Lollie Sails, MD;  Location: [facility] ENDOSCOPY;  Service: Endoscopy;  Laterality: N/A;  . HERNIA REPAIR    . Knee surgeries    . TONSILLECTOMY       Home Medications:  Prior to Admission medications   Medication Sig Start Date End Date Taking? Authorizing Provider  albuterol (PROVENTIL HFA;VENTOLIN HFA) 108 (90 Base) MCG/ACT inhaler Inhale 2 puffs into the lungs every 6 (six) hours as needed for shortness of breath. 06/13/16  Yes [provider]  aspirin EC 81 MG tablet Take 81 mg by mouth daily.   Yes [provider]  aspirin-sod bicarb-citric acid (ALKA-SELTZER) 325 MG TBEF tablet Take 650 mg by mouth every 6 (six) hours as needed (indigestion).    Yes [provider]  atorvastatin (LIPITOR) 80 MG tablet Take 80 mg by mouth at bedtime.  03/04/15  Yes [provider]  b complex vitamins capsule Take 1 capsule by mouth daily.   Yes [provider]  baclofen (LIORESAL)  10 MG tablet Take 2 tablets by mouth at bedtime.    Yes [provider]  cetirizine (ZYRTEC) 10 MG tablet Take 10 mg by mouth daily.   Yes [provider]  diazepam (VALIUM) 5 MG tablet Take 5 mg by mouth at bedtime.  01/14/15  Yes [provider]  diclofenac sodium (VOLTAREN) 1 % GEL Apply 2 g topically daily as needed (pain).    Yes [provider]  Diphenhyd-Hydrocort-Nystatin  (FIRST-DUKES MOUTHWASH) SUSP Use as directed 5 mLs in the mouth or throat 3 (three) times daily. 05/14/18  Yes Carrie Mew, MD  diphenhydrAMINE (BENADRYL) 25 mg capsule Take 50 mg by mouth every 6 (six) hours as needed for itching or allergies.   Yes [provider]  esomeprazole (NEXIUM) 40 MG capsule Take 40 mg by mouth daily. 06/13/16 07/01/18 Yes [provider]  fluticasone (FLONASE) 50 MCG/ACT nasal spray Place 2 sprays into the nose daily.   Yes [provider]  guaiFENesin (ROBITUSSIN) 100 MG/5ML SOLN Take 5 mLs (100 mg total) by mouth every 4 (four) hours as needed for cough or to loosen phlegm. 05/14/18  Yes Carrie Mew, MD  hydrOXYzine (VISTARIL) 50 MG capsule Take 50 mg by mouth daily. 06/04/17  Yes [provider]  liraglutide (VICTOZA) 18 MG/3ML SOPN Inject 1.2 mg into the skin daily. 06/06/18  Yes [provider]  losartan-hydrochlorothiazide (HYZAAR) 100-25 MG per tablet Take 1 tablet by mouth daily. 02/25/15  Yes [provider]  Menthol, Topical Analgesic, (ICY HOT EX) Apply 1 application topically daily as needed (pain).   Yes [provider]  metoprolol succinate (TOPROL-XL) 50 MG 24 hr tablet Take 50 mg by mouth daily. 12/24/17  Yes [provider]  MONTELUKAST SODIUM PO Take 1 tablet by mouth daily.   Yes [provider]  naproxen (NAPROSYN) 500 MG tablet Take 1 tablet (500 mg total) by mouth 2 (two) times daily with a meal. 04/22/17  Yes Triplett, Cari B, FNP  naproxen sodium (ALEVE) 220 MG tablet Take 220 mg by mouth 2 (two) times daily as needed (pain).   Yes [provider]  oxyCODONE (ROXICODONE) 15 MG immediate release tablet Take 15 mg by mouth 5 (five) times daily. For pain. 09/25/15  Yes [provider]  oxymetazoline (AFRIN) 0.05 % nasal spray Place 1 spray into both nostrils 2 (two) times daily as needed for congestion.   Yes [provider]    Phenylephrine-Pheniramine-DM [facility] COLD & COUGH PO) Take 2 capsules by mouth every 4 (four) hours as needed (Cough and cold).   Yes [provider]  potassium chloride SA (KLOR-CON M20) 20 MEQ tablet Take 20 mEq by mouth daily.   Yes [provider]  PROMETHAZINE HCL PO Take by mouth.   Yes [provider]  cyclobenzaprine (FLEXERIL) 10 MG tablet Take 1 tablet (10 mg total) by mouth 3 (three) times daily as needed for muscle spasms. Caleb [name] not taking: Reported on 05/07/2017 04/22/17   Sherrie George B, FNP  ketorolac (ACULAR) 0.5 % ophthalmic solution Place 1 drop into the left eye 4 (four) times daily. Caleb [name] not taking: Reported on 05/07/2017 03/28/17   Little, Traci M, PA-C  methocarbamol (ROBAXIN-750) 750 MG tablet Take 1 tablet (750 mg total) by mouth every 8 (eight) hours as needed for muscle spasms. Caleb [name] not taking: Reported on 01/24/2017 10/18/15   Sherrie George B, FNP  ondansetron (ZOFRAN) 4 MG tablet Take 1 tablet (4 mg total) by mouth daily  as needed for nausea or vomiting. Caleb [name] not taking: Reported on 06/30/2018 11/11/15   Orbie Pyo, MD    Inpatient Medications: Scheduled Meds: . aspirin EC  325 mg Oral Daily  . atorvastatin  80 mg Oral QHS  . B-complex with vitamin C  1 tablet Oral Daily  . baclofen  10 mg Oral QHS  . enoxaparin (LOVENOX) injection  40 mg Subcutaneous QHS  . fluticasone  2 spray Each Nare Daily  . losartan  100 mg Oral Daily   And  . hydrochlorothiazide  25 mg Oral Daily  . hydrOXYzine  50 mg Oral Daily  . insulin aspart  0-9 Units Subcutaneous TID WC  . loratadine  10 mg Oral Daily  . magic mouthwash  5 mL Oral TID  . metoprolol succinate  50 mg Oral Daily  . montelukast  4 mg Oral QHS  . naproxen  500 mg Oral BID WC  . nicotine  21 mg Transdermal Daily  . oxyCODONE  15 mg Oral 5 X Daily  . pantoprazole  40 mg Oral Daily   Continuous Infusions:  PRN Meds: acetaminophen, albuterol, diclofenac sodium,  diphenhydrAMINE, guaiFENesin, hydrALAZINE, HYDROmorphone (DILAUDID) injection, LORazepam, naphazoline-glycerin, ondansetron (ZOFRAN) IV, pregabalin, zolpidem  Allergies:    Allergies  Allergen Reactions  . Bee Venom Anaphylaxis  . Contrast Media [Iodinated Diagnostic Agents] Anaphylaxis  . Metformin And Related Anaphylaxis    Unknown   . Lisinopril Swelling    Social [name]:   Social [name]   Socioeconomic [name]  . Marital status: Married    Spouse name: Not on file  . Number of children: Not on file  . Years of education: Not on file  . Highest education level: Not on file  Occupational [name]  . Not on file  Social Needs  . Financial resource strain: Not on file  . Food insecurity:    Worry: Not on file    Inability: Not on file  . Transportation needs:    Medical: Not on file    Non-medical: Not on file  Tobacco Use  . Smoking status: Current Every Day Smoker    Packs/day: 1.00    Years: 20.00    Pack years: 20.00    Types: Cigarettes  . Smokeless tobacco: Never Used  Substance and Sexual Activity  . Alcohol use: Yes    Alcohol/week: 4.0 standard drinks    Types: 4 Cans of beer per week    Comment: occas.   . Drug use: No  . Sexual activity: Yes  Lifestyle  . Physical activity:    Days per week: Not on file    Minutes per session: Not on file  . Stress: Not on file  Relationships  . Social connections:    Talks on phone: Not on file    Gets together: Not on file    Attends religious service: Not on file    Active member of club or organization: Not on file    Attends meetings of clubs or organizations: Not on file    Relationship status: Not on file  . Intimate partner violence:    Fear of current or ex partner: Not on file    Emotionally abused: Not on file    Physically abused: Not on file    Forced sexual activity: Not on file  Other Topics Concern  . Not on file  Social [name] Narrative  . Not on file    Family [name]:    Family  [name]  Problem Relation Age of Onset  . Diabetes Mother   . Cancer Paternal Grandmother   . Cancer Paternal Grandfather      ROS:  Please see the [name] of present illness.   All other ROS reviewed and negative.     Physical Exam/Data:   Vitals:   07/01/18 0731 07/01/18 0800 07/01/18 1126 07/01/18 1130  BP: 104/60 (!) 91/49 (!) 88/57 90/64  Pulse: 92 74 69 67  Resp: 17 12 (!) 8 10  Temp: 97.9 F (36.6 C)  97.7 F (36.5 C)   TempSrc: Oral  Oral   SpO2: 95% 91% 92% (!) 89%  Weight:      Height:        Intake/Output Summary (Last 24 hours) at 07/01/2018 1301 Last data filed at 07/01/2018 1200 Gross per 24 hour  Intake 182.76 ml  Output 201 ml  Net -18.24 ml   Filed Weights   06/30/18 1909 07/01/18 0116  Weight: 111.1 kg 107.3 kg   Body mass index is 32.08 kg/m.  General:  Well nourished, well developed, in no acute distress HEENT: normal Lymph: no adenopathy Neck: no JVD Endocrine:  No thryomegaly Vascular: No carotid bruits; FA pulses 2+ bilaterally without bruits  Cardiac:  normal S1, S2; RRR; no murmur  Lungs:  clear to auscultation bilaterally, no wheezing, rhonchi. +bibasilar crackles Abd: soft, nontender, no hepatomegaly  Ext: no edema Musculoskeletal:  No deformities, BUE and BLE strength normal and equal Skin: warm and dry  Neuro:  CNs 2-12 intact, no focal abnormalities noted Psych:  Normal affect   EKG:  The EKG was personally reviewed and demonstrates: Mild sinus tachycardia with T wave inversion in V3 and V4 Telemetry:  Telemetry was personally reviewed and demonstrates: Normal sinus rhythm without significant ST-T wave changes  Relevant CV Studies:  Echo 01/25/2017 LV EF: 55% -   60% Study Conclusions  - Left ventricle: The cavity size was normal. There was moderate   concentric hypertrophy. Systolic function was normal. The   estimated ejection fraction was in the range of 55% to 60%. Wall   motion was normal; there were no regional wall  motion   abnormalities. Left ventricular diastolic function parameters   were normal. - Aortic valve: There was trivial regurgitation. - Left atrium: The atrium was mildly dilated. - Pulmonary arteries: Systolic pressure could not be accurately   estimated.  Laboratory Data:  Chemistry Recent Labs  Lab 06/30/18 1943  NA 138  K 3.4*  CL 102  CO2 21*  GLUCOSE 299*  BUN 9  CREATININE 0.89  CALCIUM 9.6  GFRNONAA >60  GFRAA >60  ANIONGAP 15    No results for input(s): PROT, ALBUMIN, AST, ALT, ALKPHOS, BILITOT in the last 168 hours. Hematology Recent Labs  Lab 06/30/18 1943  WBC 12.4*  RBC 5.09  HGB 15.9  HCT 45.0  MCV 88.4  MCH 31.2  MCHC 35.3  RDW 13.1  PLT 287   Cardiac Enzymes Recent Labs  Lab 07/01/18 0000 07/01/18 0128 07/01/18 1044  TROPONINI <0.03 <0.03 <0.03    Recent Labs  Lab 06/30/18 1948 06/30/18 2258  TROPIPOC 0.00 0.01    BNPNo results for input(s): BNP, PROBNP in the last 168 hours.  DDimer  Recent Labs  Lab 06/30/18 1943  DDIMER <0.27    Radiology/Studies:  Dg Chest 2 View  Result Date: 06/30/2018 CLINICAL DATA:  Left chest pain EXAM: CHEST - 2 VIEW COMPARISON:  May 13, 2018 FINDINGS: The heart size and mediastinal  contours are within normal limits. There is minor scar of the left lung base. There is no focal infiltrate, pulmonary edema, or pleural effusion. The visualized skeletal structures are unremarkable. IMPRESSION: No active cardiopulmonary disease. Electronically Signed   By: Abelardo Diesel M.D.   On: 06/30/2018 19:54   Ct Head Wo Contrast  Result Date: 07/01/2018 CLINICAL DATA:  42 y/o M; headache, nausea, anxiousness, left hand weakness. EXAM: CT HEAD WITHOUT CONTRAST TECHNIQUE: Contiguous axial images were obtained from the base of the skull through the vertex without intravenous contrast. COMPARISON:  09/14/2016 CT head. FINDINGS: Brain: No evidence of acute infarction, hemorrhage, hydrocephalus, extra-axial collection or  mass lesion/mass effect. Vascular: No hyperdense vessel or unexpected calcification. Skull: Normal. Negative for fracture or focal lesion. Sinuses/Orbits: Mild frontal, sphenoid, and maxillary sinus mucosal thickening and partial opacification of the ethmoid air cells, similar to the prior CT of the head. Normal aeration of mastoid air cells. Orbits are unremarkable. Other: None. IMPRESSION: 1. No acute intracranial abnormality. Stable unremarkable CT of the brain. 2. Stable paranasal sinus disease greatest in ethmoid air cells. Electronically Signed   By: Kristine Garbe M.D.   On: 07/01/2018 00:43    Assessment and Plan:   1. Chest pain:   -Chest pain occurs at rest.  EKG showed slight T wave inversion in the anterior leads.  -Caleb [name] reportedly had a cardiac catheterization in 2014 that only revealed minor irregularities.  -We will discuss with MD regarding coronary CT versus cardiac catheterization.  Ideally coronary CT should be done given prolonged chest pain with negative troponin.  However Caleb [name] does have contrast dye [name] with [name] of anaphylaxis reaction including swelling in the throat and hive after receiving contrast dye.  2. Hypertension: Blood pressure mildly elevated on arrival, currently on nitroglycerin drip.  Caleb [name] had a drop in the blood pressure earlier with combination of Dilaudid and Ativan.  3. Hyperlipidemia: On Lipitor 80 mg daily  4. DM2: Sliding scale insulin   For questions or updates, please contact [facility] Please consult [email or web address] for contact info under Cardiology/STEMI.   Hilbert Corrigan, [place]  07/01/2018 1:01 PM   I have seen and examined the Caleb [name] along with Almyra Deforest, PA.  I have reviewed the chart, notes and new data.  I agree with PA's note.  Key new complaints: Currently has a vague sensation of chest pressure, mild Key examination changes: Overweight, otherwise normal cardiovascular exam Key new findings / data: Normal cardiac  enzymes; ECG shows new ST segment depression and T wave inversion in the inferior leads as well as in V4-V6  PLAN: Symptoms were atypical, but prolonged. Cardiac enzymes are normal. His ECG changes could be related to LVH, described on his echocardiogram, but are new when compared with a tracing from just a few months ago. Further evaluation is necessary.  Recommend coronary CT angiogram.  Caleb [name] has a [name] of contrast related urticaria, will premedicate with steroids and antihistamines.  Caleb [name] also has claustrophobia and PTSD and will require light sedation before the study.  Sanda Klein, MD, [place] [phone] 07/01/2018, 3:12 PM

## 2018-07-01 NOTE — ED Notes (Signed)
Attempted to call report

## 2018-07-01 NOTE — Progress Notes (Signed)
  Echocardiogram 2D Echocardiogram has been performed.  Beatryce Colombo L Androw 07/01/2018, 3:03 PM

## 2018-07-01 NOTE — Plan of Care (Signed)
  Problem: Education: Goal: Knowledge of General Education information will improve Description Including pain rating scale, medication(s)/side effects and non-pharmacologic comfort measures Outcome: Progressing   Problem: Health Behavior/Discharge Planning: Goal: Ability to manage health-related needs will improve Outcome: Progressing   Problem: Clinical Measurements: Goal: Ability to maintain clinical measurements within normal limits will improve Outcome: Progressing Goal: Will remain free from infection Outcome: Progressing Goal: Diagnostic test results will improve Outcome: Progressing Goal: Respiratory complications will improve Outcome: Progressing Goal: Cardiovascular complication will be avoided Outcome: Progressing   Problem: Activity: Goal: Risk for activity intolerance will decrease Outcome: Progressing   Problem: Nutrition: Goal: Adequate nutrition will be maintained Outcome: Progressing   Problem: Coping: Goal: Level of anxiety will decrease Outcome: Progressing   Problem: Elimination: Goal: Will not experience complications related to bowel motility Outcome: Progressing Goal: Will not experience complications related to urinary retention Outcome: Progressing   Problem: Safety: Goal: Ability to remain free from injury will improve Outcome: Progressing   Problem: Skin Integrity: Goal: Risk for impaired skin integrity will decrease Outcome: Progressing  Pt continues to complain of pain left chest, left side, dilaudid iv given

## 2018-07-02 ENCOUNTER — Observation Stay (HOSPITAL_COMMUNITY): Payer: Medicare HMO

## 2018-07-02 DIAGNOSIS — I639 Cerebral infarction, unspecified: Secondary | ICD-10-CM | POA: Diagnosis not present

## 2018-07-02 DIAGNOSIS — R072 Precordial pain: Secondary | ICD-10-CM

## 2018-07-02 DIAGNOSIS — I10 Essential (primary) hypertension: Secondary | ICD-10-CM

## 2018-07-02 DIAGNOSIS — E7800 Pure hypercholesterolemia, unspecified: Secondary | ICD-10-CM

## 2018-07-02 DIAGNOSIS — E119 Type 2 diabetes mellitus without complications: Secondary | ICD-10-CM | POA: Diagnosis not present

## 2018-07-02 DIAGNOSIS — F419 Anxiety disorder, unspecified: Secondary | ICD-10-CM | POA: Diagnosis not present

## 2018-07-02 DIAGNOSIS — R079 Chest pain, unspecified: Secondary | ICD-10-CM | POA: Diagnosis not present

## 2018-07-02 LAB — GLUCOSE, CAPILLARY
Glucose-Capillary: 358 mg/dL — ABNORMAL HIGH (ref 70–99)
Glucose-Capillary: 342 mg/dL — ABNORMAL HIGH (ref 70–99)

## 2018-07-02 LAB — BASIC METABOLIC PANEL
Anion gap: 10 (ref 5–15)
BUN: 12 mg/dL (ref 6–20)
CALCIUM: 9.6 mg/dL (ref 8.9–10.3)
CO2: 23 mmol/L (ref 22–32)
CREATININE: 1.08 mg/dL (ref 0.61–1.24)
Chloride: 101 mmol/L (ref 98–111)
Glucose, Bld: 334 mg/dL — ABNORMAL HIGH (ref 70–99)
Potassium: 5.8 mmol/L — ABNORMAL HIGH (ref 3.5–5.1)
SODIUM: 134 mmol/L — AB (ref 135–145)

## 2018-07-02 LAB — CBC
HCT: 43.7 % (ref 39.0–52.0)
HEMOGLOBIN: 15.3 g/dL (ref 13.0–17.0)
MCH: 31.7 pg (ref 26.0–34.0)
MCHC: 35 g/dL (ref 30.0–36.0)
MCV: 90.5 fL (ref 78.0–100.0)
PLATELETS: 258 10*3/uL (ref 150–400)
RBC: 4.83 MIL/uL (ref 4.22–5.81)
RDW: 13 % (ref 11.5–15.5)
WBC: 10.6 10*3/uL — AB (ref 4.0–10.5)

## 2018-07-02 LAB — HEMOGLOBIN A1C
HEMOGLOBIN A1C: 8.5 % — AB (ref 4.8–5.6)
Mean Plasma Glucose: 197 mg/dL

## 2018-07-02 LAB — MAGNESIUM: MAGNESIUM: 2.1 mg/dL (ref 1.7–2.4)

## 2018-07-02 LAB — POTASSIUM: POTASSIUM: 5.2 mmol/L — AB (ref 3.5–5.1)

## 2018-07-02 MED ORDER — METOPROLOL TARTRATE 5 MG/5ML IV SOLN
INTRAVENOUS | Status: AC
  Filled 2018-07-02: qty 10

## 2018-07-02 MED ORDER — NITROGLYCERIN 0.4 MG SL SUBL
0.8000 mg | SUBLINGUAL_TABLET | Freq: Once | SUBLINGUAL | Status: AC
  Administered 2018-07-02: 0.8 mg via SUBLINGUAL

## 2018-07-02 MED ORDER — LORAZEPAM 2 MG/ML IJ SOLN
1.0000 mg | Freq: Once | INTRAMUSCULAR | Status: AC
  Administered 2018-07-02: 1 mg via INTRAVENOUS
  Filled 2018-07-02: qty 1

## 2018-07-02 MED ORDER — LOSARTAN POTASSIUM-HCTZ 100-25 MG PO TABS: 0.5000 | ORAL_TABLET | Freq: Every day | ORAL | 11 refills | Status: DC

## 2018-07-02 MED ORDER — NITROGLYCERIN 0.4 MG SL SUBL
SUBLINGUAL_TABLET | SUBLINGUAL | Status: AC
  Administered 2018-07-02: 0.8 mg via SUBLINGUAL
  Filled 2018-07-02: qty 2

## 2018-07-02 MED ORDER — IOPAMIDOL (ISOVUE-370) INJECTION 76%: 100.0000 mL | Freq: Once | INTRAVENOUS | Status: DC

## 2018-07-02 MED ORDER — INSULIN GLARGINE 100 UNIT/ML ~~LOC~~ SOLN
10.0000 [IU] | Freq: Once | SUBCUTANEOUS | Status: AC
  Administered 2018-07-02: 10 [IU] via SUBCUTANEOUS
  Filled 2018-07-02 (×2): qty 0.1

## 2018-07-02 MED ORDER — METOPROLOL TARTRATE 5 MG/5ML IV SOLN
10.0000 mg | Freq: Once | INTRAVENOUS | Status: AC
  Administered 2018-07-02: 10 mg via INTRAVENOUS

## 2018-07-02 MED ORDER — ALPRAZOLAM 0.5 MG PO TABS
0.5000 mg | ORAL_TABLET | Freq: Three times a day (TID) | ORAL | Status: DC | PRN
Start: 2018-07-02 — End: 2018-07-02
  Administered 2018-07-02: 0.5 mg via ORAL
  Filled 2018-07-02: qty 1

## 2018-07-02 MED ORDER — IOPAMIDOL (ISOVUE-370) INJECTION 76%
100.0000 mL | Freq: Once | INTRAVENOUS | Status: AC | PRN
  Administered 2018-07-02: 100 mL via INTRAVENOUS

## 2018-07-02 MED ORDER — IOPAMIDOL (ISOVUE-370) INJECTION 76%
INTRAVENOUS | Status: AC
  Filled 2018-07-02: qty 100

## 2018-07-02 MED ORDER — DIPHENHYDRAMINE HCL 25 MG PO CAPS
25.0000 mg | ORAL_CAPSULE | Freq: Four times a day (QID) | ORAL | Status: DC | PRN
  Administered 2018-07-02: 25 mg via ORAL
  Filled 2018-07-02: qty 1

## 2018-07-02 NOTE — Progress Notes (Addendum)
Inpatient Diabetes Program Recommendations  AACE/ADA: New Consensus Statement on Inpatient Glycemic Control (2019)  Target Ranges:  Prepandial:   less than 140 mg/dL      Peak postprandial:   less than 180 mg/dL (1-2 hours)      Critically ill patients:  140 - 180 mg/dL  Results for BEXLEY, MCLESTER (MRN [id number]) as of 07/02/2018 07:47  Ref. Range 07/02/2018 03:43  Glucose Latest Ref Range: 70 - 99 mg/dL 334 (H)   Results for NICHOLI, GHUMAN (MRN [id number]) as of 07/02/2018 07:47  Ref. Range 07/01/2018 08:35 07/01/2018 12:16 07/01/2018 17:14 07/01/2018 21:08  Glucose-Capillary Latest Ref Range: 70 - 99 mg/dL 159 (H) 152 (H) 222 (H) 214 (H)  Results for EZELL, POKE (MRN [id number]) as of 07/02/2018 07:47  Ref. Range 07/01/2018 01:28  Hemoglobin A1C Latest Ref Range: 4.8 - 5.6 % 8.5 (H)   Review of Glycemic Control  Diabetes history: DM2 Outpatient Diabetes medications: Victoza 1.2 mg daily Current orders for Inpatient glycemic control: Novolog 0-9 units TID with meals  Inpatient Diabetes Program Recommendations: Insulin-Basal: Noted patient received steroids and lab glucose 334 mg/d at 3:43 am today. Please consider ordering one time dose of Lantus 10 units x1 now. Then reassess daily to determine if basal insulin is needed especially if steroids are continued.  Correction (SSI): Please consider ordering Novolog 0-5 units QHS for bedtime correction. HgbA1C: A1C 8.5% on 07/01/2018 indicating an average glucose of 197 mg/dl. Patient needs to follow up with PCP regarding DM control.  Addendum 07/02/18@13 :30-Spoke with patient and his wife about diabetes and home regimen for diabetes control. Patient reports that he is taking Victoza 1.2 mg daily as an outpatient for diabetes control. Patient reports that he is taking Victoza as prescribed.  Patient states that he checks his glucose 1-2 times per day and that it is usually 95-110 mg/dl during the day after eating.  Inquired  about prior A1C and patient reports that his last A1C value in July was 7.9%. Discussed A1C results (8.5% on 07/01/18) and explained that his current A1C indicates an average glucose of 197 mg/dl over the past 2-3 months. Patient reports he has been on Prednisone over the past 3 months as an outpatient. Explained that recent steroid use will contribute to elevated glucose and A1C. Informed patient that he got Prednisone as an inpatient which could keep glucose up for the next few days. Patient's wife reports that he was taking Victoza 1.8 mg but he was having some low glucose values so he went back down to Victoza 1.2 mg/dl.  Encouraged patient to continue monitoring glucose at home and follow up with PCP regarding DM control.  Patient verbalized understanding of information discussed and he states that he has no further questions at this time related to diabetes.  Thanks, Barnie Alderman, RN, MSN, CDE Diabetes Coordinator Inpatient Diabetes Program [phone] (Team Pager from 8am to 5pm)

## 2018-07-02 NOTE — Care Management Obs Status (Signed)
[place] NOTIFICATION   Patient Details  Name: Caleb [name] MRN: [id number] Date of Birth: [birth year]   Medicare Observation Status Notification Given:  Yes    Maryclare Labrador, RN 07/02/2018, 10:41 AM

## 2018-07-02 NOTE — Progress Notes (Signed)
Patient for discharge to home.  IV discontinued.  AVS discussed and patient is comfortable at baseline upon discharge.

## 2018-07-02 NOTE — Discharge Summary (Signed)
Physician Discharge Summary  Caleb [name] WVP:[id number] DOB: May 29, 1976 DOA: 06/30/2018  PCP: Venida Jarvis, MD  Admit date: 06/30/2018 Discharge date: 07/02/2018  Time spent: 35 minutes  Recommendations for Outpatient Follow-up:  PCP in one week  Discharge Diagnoses:  Principal Problem:   Chest pain Active Problems:   Anxiety   Diabetes mellitus without complication (HCC)   GERD (gastroesophageal reflux disease)   Hypertension   Hypercholesteremia   Stroke [facility])   Hypertensive urgency   Hypokalemia   Fall   Leukocytosis   Discharge Condition: stable  Diet recommendation: heart healthy  Filed Weights   06/30/18 1909 07/01/18 0116  Weight: 111.1 kg 107.3 kg    History of present illness:  42 y.o.malewith a hx ofhypertension, hyperlipidemia, diabetes mellitus, stroke, GERD, anxiety, PTSD, PE (provoked PE after knee surgery 6 years ago), tobacco abuse, and CAD who presented with chest pain associated with shortness of breath and diaphoresis.  In the ED the pt was found to havea negative d-dimer, negative troponin, WBC 12.4, potassium 3.4, creatinine normal, temperature normal, tachycardia, tachypnea, oxygen sat 90 to 94% on room air, and a negative chest x-ray.    Hospital Course:   Atypical Chest pain -left heart catheterization 2014 showed minor irregularities with no significant obstructive disease -Troponin negative 3, EKG abnormal w/ T wave inversions in anterior leads -d-dimer negative -cardiology consulted, due to contrast allergy underwent premedication with prednisone and Benadryl for Coronary CTA, this was completed earlier today, this noted Coronary calcium score of 50, Mild to moderate CAD in the proximal and mid LAD, discussed with cardiology Dr.Croitoru, this is unchanged from prior cardiac cath in 2014, no further workup needed at this time -Advised risk factor modification with smoking cessation  -significant anxiety, likely  contributing to symptoms as well  Anxiety -required additional benzos for anxiety -did NOT prescribe at D/C  -on valium QHS at baseline  DM2 A1c5.9 01/2017  -stable  GERD Protonix  Hypercholesteremia Lipitor - LDL 140  Hx of Stroke  ASA and lipitor  Hyperkalemia -mild, supplemental K stopped and hyzaar dose cut down -advised to recheck K in 1 week  Recent accidental Fall Stated he had a head injury - CT head negative foracute intracranial abnormalities  Chronic pain -on oxycodone 15 mg 5 times a day and Lyrica -Resume at discharge, needs to titrate this down   Consultations:  Cardiology  Discharge Exam: Vitals:   07/02/18 1054 07/02/18 1208  BP: (!) 160/90 138/75  Pulse:    Resp:  18  Temp:  97.6 F (36.4 C)  SpO2:      General: AAOx3 Cardiovascular: S1S2/RRR Respiratory: CTAB  Discharge Instructions   Discharge Instructions    Diet - low sodium heart healthy   Complete by:  As directed    Diet Carb Modified   Complete by:  As directed    Increase activity slowly   Complete by:  As directed      Allergies as of 07/02/2018      Reactions   Bee Venom Anaphylaxis   Contrast Media [iodinated Diagnostic Agents] Anaphylaxis   Metformin And Related Anaphylaxis   Unknown   Lisinopril Swelling      Medication List    STOP taking these medications   cyclobenzaprine 10 MG tablet Commonly known as:  FLEXERIL   ketorolac 0.5 % ophthalmic solution Commonly known as:  ACULAR   KLOR-CON M20 20 MEQ tablet Generic drug:  potassium chloride SA   methocarbamol 750 MG tablet Commonly known  as:  ROBAXIN   naproxen sodium 220 MG tablet Commonly known as:  ALEVE   ondansetron 4 MG tablet Commonly known as:  ZOFRAN     TAKE these medications   albuterol 108 (90 Base) MCG/ACT inhaler Commonly known as:  PROVENTIL HFA;VENTOLIN HFA Inhale 2 puffs into the lungs every 6 (six) hours as needed for shortness of breath.   aspirin EC 81 MG  tablet Take 81 mg by mouth daily.   aspirin-sod bicarb-citric acid 325 MG Tbef tablet Commonly known as:  ALKA-SELTZER Take 650 mg by mouth every 6 (six) hours as needed (indigestion).   atorvastatin 80 MG tablet Commonly known as:  LIPITOR Take 80 mg by mouth at bedtime.   b complex vitamins capsule Take 1 capsule by mouth daily.   baclofen 10 MG tablet Commonly known as:  LIORESAL Take 2 tablets by mouth at bedtime.   cetirizine 10 MG tablet Commonly known as:  ZYRTEC Take 10 mg by mouth daily.   diazepam 5 MG tablet Commonly known as:  VALIUM Take 5 mg by mouth at bedtime.   diclofenac sodium 1 % Gel Commonly known as:  VOLTAREN Apply 2 g topically daily as needed (pain).   diphenhydrAMINE 25 mg capsule Commonly known as:  BENADRYL Take 50 mg by mouth every 6 (six) hours as needed for itching or allergies.   esomeprazole 40 MG capsule Commonly known as:  NEXIUM Take 40 mg by mouth daily.   FIRST-DUKES MOUTHWASH Susp Use as directed 5 mLs in the mouth or throat 3 (three) times daily.   fluticasone 50 MCG/ACT nasal spray Commonly known as:  FLONASE Place 2 sprays into the nose daily.   guaiFENesin 100 MG/5ML Soln Commonly known as:  ROBITUSSIN Take 5 mLs (100 mg total) by mouth every 4 (four) hours as needed for cough or to loosen phlegm.   hydrOXYzine 50 MG capsule Commonly known as:  VISTARIL Take 50 mg by mouth daily.   ICY HOT EX Apply 1 application topically daily as needed (pain).   losartan-hydrochlorothiazide 100-25 MG tablet Commonly known as:  HYZAAR Take 0.5 tablets by mouth daily. What changed:  how much to take   metoprolol succinate 50 MG 24 hr tablet Commonly known as:  TOPROL-XL Take 50 mg by mouth daily.   MONTELUKAST SODIUM PO Take 1 tablet by mouth daily.   naproxen 500 MG tablet Commonly known as:  NAPROSYN Take 1 tablet (500 mg total) by mouth 2 (two) times daily with a meal.   oxyCODONE 15 MG immediate release  tablet Commonly known as:  ROXICODONE Take 15 mg by mouth 5 (five) times daily. For pain.   oxymetazoline 0.05 % nasal spray Commonly known as:  AFRIN Place 1 spray into both nostrils 2 (two) times daily as needed for congestion.   PROMETHAZINE HCL PO Take by mouth.   THERAFLU COLD & COUGH PO Take 2 capsules by mouth every 4 (four) hours as needed (Cough and cold).   VICTOZA 18 MG/3ML Sopn Generic drug:  liraglutide Inject 1.2 mg into the skin daily.      Allergies  Allergen Reactions  . Bee Venom Anaphylaxis  . Contrast Media [Iodinated Diagnostic Agents] Anaphylaxis  . Metformin And Related Anaphylaxis    Unknown   . Lisinopril Swelling   Follow-up Information    Burgert, Heide Spark, MD. Schedule an appointment as soon as possible for a visit in 1 week(s).   Specialty:  Family Medicine Contact information: [address]  [place] [address] [phone]        Wellington Hampshire, MD Follow up in 1 month(s).   Specialty:  Cardiology Contact information: [place] [address] [phone]            The results of significant diagnostics from this hospitalization (including imaging, microbiology, ancillary and laboratory) are listed below for reference.    Significant Diagnostic Studies: Dg Chest 2 View  Result Date: 06/30/2018 CLINICAL DATA:  Left chest pain EXAM: CHEST - 2 VIEW COMPARISON:  May 13, 2018 FINDINGS: The heart size and mediastinal contours are within normal limits. There is minor scar of the left lung base. There is no focal infiltrate, pulmonary edema, or pleural effusion. The visualized skeletal structures are unremarkable. IMPRESSION: No active cardiopulmonary disease. Electronically Signed   By: Abelardo Diesel M.D.   On: 06/30/2018 19:54   Ct Head Wo Contrast  Result Date: 07/01/2018 CLINICAL DATA:  42 y/o M; headache, nausea, anxiousness, left hand weakness. EXAM: CT HEAD WITHOUT CONTRAST  TECHNIQUE: Contiguous axial images were obtained from the base of the skull through the vertex without intravenous contrast. COMPARISON:  09/14/2016 CT head. FINDINGS: Brain: No evidence of acute infarction, hemorrhage, hydrocephalus, extra-axial collection or mass lesion/mass effect. Vascular: No hyperdense vessel or unexpected calcification. Skull: Normal. Negative for fracture or focal lesion. Sinuses/Orbits: Mild frontal, sphenoid, and maxillary sinus mucosal thickening and partial opacification of the ethmoid air cells, similar to the prior CT of the head. Normal aeration of mastoid air cells. Orbits are unremarkable. Other: None. IMPRESSION: 1. No acute intracranial abnormality. Stable unremarkable CT of the brain. 2. Stable paranasal sinus disease greatest in ethmoid air cells. Electronically Signed   By: Kristine Garbe M.D.   On: 07/01/2018 00:43   Ct Coronary Morph W/cta Cor W/score W/ca W/cm &/or Wo/cm  Addendum Date: 07/02/2018   ADDENDUM REPORT: 07/02/2018 13:03 EXAM: OVER-READ INTERPRETATION  CT CHEST The following report is an over-read performed by radiologist Dr. Alvino Blood [facility] on 07/02/2018. This over-read does not include interpretation of cardiac or coronary anatomy or pathology. The coronary CTA interpretation by the cardiologist is attached. COMPARISON:  None. FINDINGS: Limited view of the lung parenchyma demonstrates no suspicious nodularity. Airways are normal. Limited view of the mediastinum demonstrates no adenopathy. Esophagus normal. Limited view of the upper abdomen unremarkable. Limited view of the skeleton and chest wall is unremarkable. IMPRESSION: No significant extracardiac findings. Electronically Signed   By: Suzy Bouchard M.D.   On: 07/02/2018 13:03   Result Date: 07/02/2018 CLINICAL DATA:  42 year old male with h/o DM and HLP, poor functional status due to orthopedic and psychiatric problems, known moderate CAD by remote cath  presenting with chest pain. EXAM: Cardiac/Coronary  CT TECHNIQUE: The patient was scanned on a [employer]. FINDINGS: A 120 kV prospective scan was triggered in the descending thoracic aorta at 111 HU's. Axial non-contrast 3 mm slices were carried out through the heart. The data set was analyzed on a dedicated work station and scored using the [place]. Gantry rotation speed was 250 msecs and collimation was .6 mm. 10 mg of iv Metoprolol and 0.8 mg of sl NTG was given. The 3D data set was reconstructed in 5% intervals of the 67-82 % of the R-R cycle. Diastolic phases were analyzed on a dedicated work station using MPR, MIP and VRT modes. The patient received 80 cc of contrast. Aorta:  Normal size.  No calcifications.  No dissection.  Aortic Valve:  Trileaflet.  Minimal calcifications. Coronary Arteries:  Normal coronary origin.  Right dominance. RCA is a large dominant artery that gives rise to PDA and PLVB. There are minimal non-calcified irregularities. Left main is a large artery that gives rise to LAD and LCX arteries. Left main has mild eccentric ostial calcified plaque with associated stenosis 0-25%. LAD is a large vessel that gives rise to two small diagonal arteries and wraps around the apex. Proximal LAD has mild diffuse mixed plaque with stenosis 25-50%. Mid LAD has a mild to moderate diffuse predominantly calcified plaque with stenosis 25-50% and a focal 50-69% stenosis at the take of 2. diagonal artery. Mid to distal LAD has mild diffuse non-obstructive plaque. D1 is a small artery with mild plaque. D2 is a medium size artery with mild plaque. LCX is a non-dominant artery that gives rise to one large OM1 branch. There mild diffuse plaque with stenosis 0-25%. Other findings: Normal pulmonary vein drainage into the left atrium. Normal let atrial appendage without a thrombus. Normal size of the pulmonary artery. IMPRESSION: 1. Coronary calcium score of 50. This was 57 percentile for age and  sex matched control. 2. Normal coronary origin with right dominance. 3. Mild to moderate CAD in the proximal and mid LAD. Additional analysis with CT FFR will be submitted. Electronically Signed: By: Ena Dawley On: 07/02/2018 11:30    Microbiology: Recent Results (from the past 240 hour(s))  MRSA PCR Screening     Status: None   Collection Time: 07/01/18  1:20 AM  Result Value Ref Range Status   MRSA by PCR NEGATIVE NEGATIVE Final    Comment:        The GeneXpert MRSA Assay (FDA approved for NASAL specimens only), is one component of a comprehensive MRSA colonization surveillance program. It is not intended to diagnose MRSA infection nor to guide or monitor treatment for MRSA infections. Performed at [facility], [place] [address] [place], [place] [address]   Culture, blood (Routine X 2) w Reflex to ID Panel     Status: None (Preliminary result)   Collection Time: 07/01/18  1:28 AM  Result Value Ref Range Status   Specimen Description BLOOD RIGHT ANTECUBITAL  Final   Special Requests   Final    BOTTLES DRAWN AEROBIC ONLY Blood Culture adequate volume   Culture   Final    NO GROWTH 1 DAY Performed at [facility], [place] [address] [place], [place] [address]    Report Status PENDING  Incomplete  Culture, blood (Routine X 2) w Reflex to ID Panel     Status: None (Preliminary result)   Collection Time: 07/01/18  1:39 AM  Result Value Ref Range Status   Specimen Description BLOOD RIGHT HAND  Final   Special Requests   Final    BOTTLES DRAWN AEROBIC ONLY Blood Culture adequate volume   Culture   Final    NO GROWTH 1 DAY Performed at [facility], [place] [address] [place], [place] [address]    Report Status PENDING  Incomplete     Labs: Basic Metabolic Panel: Recent Labs  Lab 06/30/18 1943 07/02/18 0343 07/02/18 0607  NA 138 134*  --   K 3.4* 5.8* 5.2*  CL 102 101  --   CO2 21* 23  --   GLUCOSE 299* 334*  --   BUN 9 12  --   CREATININE 0.89 1.08   --   CALCIUM 9.6 9.6  --  MG  --  2.1  --    Liver Function Tests: No results for input(s): AST, ALT, ALKPHOS, BILITOT, PROT, ALBUMIN in the last 168 hours. No results for input(s): LIPASE, AMYLASE in the last 168 hours. No results for input(s): AMMONIA in the last 168 hours. CBC: Recent Labs  Lab 06/30/18 1943 07/02/18 0343  WBC 12.4* 10.6*  HGB 15.9 15.3  HCT 45.0 43.7  MCV 88.4 90.5  PLT 287 258   Cardiac Enzymes: Recent Labs  Lab 07/01/18 0000 07/01/18 0128 07/01/18 1044  TROPONINI <0.03 <0.03 <0.03   BNP: BNP (last 3 results) No results for input(s): BNP in the last 8760 hours.  ProBNP (last 3 results) No results for input(s): PROBNP in the last 8760 hours.  CBG: Recent Labs  Lab 07/01/18 1216 07/01/18 1714 07/01/18 2108 07/02/18 0806 07/02/18 1212  GLUCAP 152* 222* 214* 358* 342*       Signed:  Domenic Polite MD.  [facility] 07/02/2018, 3:35 PM

## 2018-07-02 NOTE — Progress Notes (Signed)
Progress Note  Patient Name: Caleb [name] Date of Encounter: 07/02/2018  Primary Cardiologist: Kathlyn Sacramento, MD   Subjective   Anxious and upset he does not know when his CT will be performed.  Inpatient Medications    Scheduled Meds: . aspirin EC  325 mg Oral Daily  . atorvastatin  80 mg Oral QHS  . B-complex with vitamin C  1 tablet Oral Daily  . baclofen  10 mg Oral QHS  . diphenhydrAMINE  50 mg Oral Once   Or  . diphenhydrAMINE  50 mg Intravenous Once  . enoxaparin (LOVENOX) injection  40 mg Subcutaneous QHS  . fluticasone  2 spray Each Nare Daily  . hydrochlorothiazide  25 mg Oral Daily  . insulin aspart  0-9 Units Subcutaneous TID WC  . insulin glargine  10 Units Subcutaneous Once  . LORazepam  1 mg Intravenous Once  . magic mouthwash  5 mL Oral TID  . metoprolol succinate  50 mg Oral Daily  . montelukast  4 mg Oral QHS  . naproxen  500 mg Oral BID WC  . nicotine  21 mg Transdermal Daily  . oxyCODONE  15 mg Oral 5 X Daily  . pantoprazole  40 mg Oral Daily  . predniSONE  50 mg Oral Q6H   Continuous Infusions:  PRN Meds: acetaminophen, albuterol, ALPRAZolam, diclofenac sodium, guaiFENesin, hydrALAZINE, HYDROmorphone (DILAUDID) injection, LORazepam, naphazoline-glycerin, ondansetron (ZOFRAN) IV, pregabalin, senna-docusate, traMADol, zolpidem   Vital Signs    Vitals:   07/02/18 0100 07/02/18 0300 07/02/18 0600 07/02/18 0806  BP:  138/78  136/90  Pulse: 71 73 79 92  Resp: 11 12 14 17   Temp:  97.8 F (36.6 C)  97.9 F (36.6 C)  TempSrc:  Oral  Oral  SpO2: 94% 95% 94% 97%  Weight:      Height:        Intake/Output Summary (Last 24 hours) at 07/02/2018 0916 Last data filed at 07/01/2018 1900 Gross per 24 hour  Intake 87.01 ml  Output 50 ml  Net 37.01 ml   Filed Weights   06/30/18 1909 07/01/18 0116  Weight: 111.1 kg 107.3 kg    Telemetry    NSR - Personally Reviewed  ECG    No new tracing - Personally Reviewed  Physical Exam    Eating breakfast GEN: No acute distress.   Neck: No JVD Cardiac: RRR, no murmurs, rubs, or gallops.  Respiratory: Clear to auscultation bilaterally. GI: Soft, nontender, non-distended  MS: No edema; No deformity. Neuro:  Nonfocal  Psych: Normal affect   Labs    Chemistry Recent Labs  Lab 06/30/18 1943 07/02/18 0343 07/02/18 0607  NA 138 134*  --   K 3.4* 5.8* 5.2*  CL 102 101  --   CO2 21* 23  --   GLUCOSE 299* 334*  --   BUN 9 12  --   CREATININE 0.89 1.08  --   CALCIUM 9.6 9.6  --   GFRNONAA >60 >60  --   GFRAA >60 >60  --   ANIONGAP 15 10  --      Hematology Recent Labs  Lab 06/30/18 1943 07/02/18 0343  WBC 12.4* 10.6*  RBC 5.09 4.83  HGB 15.9 15.3  HCT 45.0 43.7  MCV 88.4 90.5  MCH 31.2 31.7  MCHC 35.3 35.0  RDW 13.1 13.0  PLT 287 258    Cardiac Enzymes Recent Labs  Lab 07/01/18 0000 07/01/18 0128 07/01/18 1044  TROPONINI <0.03 <0.03 <0.03  Recent Labs  Lab 06/30/18 1948 06/30/18 2258  TROPIPOC 0.00 0.01     BNPNo results for input(s): BNP, PROBNP in the last 168 hours.   DDimer  Recent Labs  Lab 06/30/18 1943  DDIMER <0.27     Radiology    Dg Chest 2 View  Result Date: 06/30/2018 CLINICAL DATA:  Left chest pain EXAM: CHEST - 2 VIEW COMPARISON:  May 13, 2018 FINDINGS: The heart size and mediastinal contours are within normal limits. There is minor scar of the left lung base. There is no focal infiltrate, pulmonary edema, or pleural effusion. The visualized skeletal structures are unremarkable. IMPRESSION: No active cardiopulmonary disease. Electronically Signed   By: Abelardo Diesel M.D.   On: 06/30/2018 19:54   Ct Head Wo Contrast  Result Date: 07/01/2018 CLINICAL DATA:  42 y/o M; headache, nausea, anxiousness, left hand weakness. EXAM: CT HEAD WITHOUT CONTRAST TECHNIQUE: Contiguous axial images were obtained from the base of the skull through the vertex without intravenous contrast. COMPARISON:  09/14/2016 CT head. FINDINGS:  Brain: No evidence of acute infarction, hemorrhage, hydrocephalus, extra-axial collection or mass lesion/mass effect. Vascular: No hyperdense vessel or unexpected calcification. Skull: Normal. Negative for fracture or focal lesion. Sinuses/Orbits: Mild frontal, sphenoid, and maxillary sinus mucosal thickening and partial opacification of the ethmoid air cells, similar to the prior CT of the head. Normal aeration of mastoid air cells. Orbits are unremarkable. Other: None. IMPRESSION: 1. No acute intracranial abnormality. Stable unremarkable CT of the brain. 2. Stable paranasal sinus disease greatest in ethmoid air cells. Electronically Signed   By: Kristine Garbe M.D.   On: 07/01/2018 00:43    Cardiac Studies   Echo 01/25/2017 LV EF: 55% - 60% Study Conclusions  - Left ventricle: The cavity size was normal. There was moderate concentric hypertrophy. Systolic function was normal. The estimated ejection fraction was in the range of 55% to 60%. Wall motion was normal; there were no regional wall motion abnormalities. Left ventricular diastolic function parameters were normal. - Aortic valve: There was trivial regurgitation. - Left atrium: The atrium was mildly dilated. - Pulmonary arteries: Systolic pressure could not be accurately estimated.   Patient Profile     42 y.o. male with DM and HLP, poor functional status due to orthopedic and psychiatric problems, known moderate CAD by remote cath, with complaints of persistent chest discomfort and mild ECG changes  Assessment & Plan    Premedicated for contrast allergy with steroids and diphenhydramine for coronary CT angio today. He is anxious and has PTSD - he will need sedatives for the study.  For questions or updates, please contact [place] Please consult [email or web address] for contact info under Cardiology/STEMI.      Signed, Sanda Klein, MD  07/02/2018, 9:16 AM

## 2018-07-02 NOTE — Progress Notes (Signed)
Caleb [name]  QVZ:[id number] DOB: 05/09/1976 DOA: 06/30/2018 PCP: Venida Jarvis, MD    Brief Narrative:  42 y.o. male with a hx of hypertension, hyperlipidemia, diabetes mellitus, stroke, GERD, anxiety, PTSD, PE (provoked PE after knee surgery 6 years ago), tobacco abuse, and CAD who presented with chest pain associated with shortness of breath and diaphoresis.  In the ED the pt was found to have a negative d-dimer, negative troponin, WBC 12.4, potassium 3.4, creatinine normal, temperature normal, tachycardia, tachypnea, oxygen sat 90 to 94% on room air, and a negative chest x-ray.    Subjective: -upset and furious at Night Nurse about not getting Lyrica and no NPO order  Assessment & Plan:  Atypical Chest pain -left heart catheterization 2014 showed minor irregularities with no significant obstructive disease -Troponin negative 3, EKG abnormal w/ T wave inversions in anterior leads -d-dimer negative -Cards planning for Coronary CTa w/ premedication due to hives report w/ IV contrast  -significant anxiety, likely contributing to symptoms as well  Anxiety -continue low-dose lorazepam especially for CTA -will NOT prescribe for use at D/C   DM2 A1c 5.9 01/2017  -stable  GERD Protonix  HTN BP now well controlled/borderline low   Hypercholesteremia Lipitor - LDL 140  Hx of Stroke  ASA and lipitor  Hypokalemia F/u in AM   Recent accidental Fall Stated he had a head injury - CT head negative for acute intracranial abnormalities  Chronic pain -on oxycodone 15 mg 5 times a day and Lyrica -Resume at discharge, needs to titrate this down  DVT prophylaxis: lovenox  Code Status: FULL CODE Family Communication: wife at bedside Disposition Plan: home pending cardiac workup  Consultants:  [facility]   Antimicrobials:  none   Objective: Blood pressure 136/90, pulse 92, temperature 97.9 F (36.6 C), temperature source Oral, resp. rate 17, height 6'  (1.829 m), weight 107.3 kg, SpO2 97 %.  Intake/Output Summary (Last 24 hours) at 07/02/2018 1032 Last data filed at 07/01/2018 1900 Gross per 24 hour  Intake 87.01 ml  Output 50 ml  Net 37.01 ml   Filed Weights   06/30/18 1909 07/01/18 0116  Weight: 111.1 kg 107.3 kg    Examination: General: No acute respiratory distress Lungs: Clear to auscultation bilaterally without wheezes or crackles Cardiovascular: Regular rate and rhythm without murmur gallop or rub normal S1 and S2 Abdomen: Nontender, nondistended, soft, bowel sounds positive, no rebound, no ascites, no appreciable mass Extremities: No significant cyanosis, clubbing, or edema bilateral lower extremities  CBC: Recent Labs  Lab 06/30/18 1943 07/02/18 0343  WBC 12.4* 10.6*  HGB 15.9 15.3  HCT 45.0 43.7  MCV 88.4 90.5  PLT 287 329   Basic Metabolic Panel: Recent Labs  Lab 06/30/18 1943 07/02/18 0343 07/02/18 0607  NA 138 134*  --   K 3.4* 5.8* 5.2*  CL 102 101  --   CO2 21* 23  --   GLUCOSE 299* 334*  --   BUN 9 12  --   CREATININE 0.89 1.08  --   CALCIUM 9.6 9.6  --   MG  --  2.1  --    GFR: Estimated Creatinine Clearance: 113.9 mL/min (by C-G formula based on SCr of 1.08 mg/dL).  Liver Function Tests: No results for input(s): AST, ALT, ALKPHOS, BILITOT, PROT, ALBUMIN in the last 168 hours. No results for input(s): LIPASE, AMYLASE in the last 168 hours. No results for input(s): AMMONIA in the last 168 hours.  Coagulation Profile: No results for input(s):  INR, PROTIME in the last 168 hours.  Cardiac Enzymes: Recent Labs  Lab 07/01/18 0000 07/01/18 0128 07/01/18 1044  TROPONINI <0.03 <0.03 <0.03    HbA1C: Hemoglobin A1C  Date/Time Value Ref Range Status  11/10/2014 05:11 AM 5.4 4.2 - 6.3 % Final    Comment:    The American Diabetes Association recommends that a primary goal of therapy should be <7% and that physicians should reevaluate the treatment regimen in patients with HbA1c values  consistently >8%.    Hgb A1c MFr Bld  Date/Time Value Ref Range Status  07/01/2018 01:28 AM 8.5 (H) 4.8 - 5.6 % Final    Comment:    (NOTE)         Prediabetes: 5.7 - 6.4         Diabetes: >6.4         Glycemic control for adults with diabetes: <7.0   01/24/2017 06:09 PM 5.9 (H) 4.8 - 5.6 % Final    Comment:    (NOTE)         Pre-diabetes: 5.7 - 6.4         Diabetes: >6.4         Glycemic control for adults with diabetes: <7.0     CBG: Recent Labs  Lab 07/01/18 0835 07/01/18 1216 07/01/18 1714 07/01/18 2108 07/02/18 0806  GLUCAP 159* 152* 222* 214* 358*    Recent Results (from the past 240 hour(s))  MRSA PCR Screening     Status: None   Collection Time: 07/01/18  1:20 AM  Result Value Ref Range Status   MRSA by PCR NEGATIVE NEGATIVE Final    Comment:        The GeneXpert MRSA Assay (FDA approved for NASAL specimens only), is one component of a comprehensive MRSA colonization surveillance program. It is not intended to diagnose MRSA infection nor to guide or monitor treatment for MRSA infections. Performed at [facility], [place] [address] [place], [place] [address]      Scheduled Meds: . iopamidol      . aspirin EC  325 mg Oral Daily  . atorvastatin  80 mg Oral QHS  . B-complex with vitamin C  1 tablet Oral Daily  . baclofen  10 mg Oral QHS  . enoxaparin (LOVENOX) injection  40 mg Subcutaneous QHS  . fluticasone  2 spray Each Nare Daily  . hydrochlorothiazide  25 mg Oral Daily  . insulin aspart  0-9 Units Subcutaneous TID WC  . insulin glargine  10 Units Subcutaneous Once  . iopamidol  100 mL Intravenous Once  . magic mouthwash  5 mL Oral TID  . metoprolol succinate  50 mg Oral Daily  . montelukast  4 mg Oral QHS  . naproxen  500 mg Oral BID WC  . nicotine  21 mg Transdermal Daily  . oxyCODONE  15 mg Oral 5 X Daily  . pantoprazole  40 mg Oral Daily    LOS: 0 days   Domenic Polite, MD [facility] Office  [phone] Pager - Text  Page per Amion  If 7PM-7AM, please contact night-coverage per Amion 07/02/2018, 10:32 AM

## 2018-07-06 LAB — CULTURE, BLOOD (ROUTINE X 2)
CULTURE: NO GROWTH
CULTURE: NO GROWTH
SPECIAL REQUESTS: ADEQUATE
Special Requests: ADEQUATE

## 2018-08-16 IMAGING — DX DG CHEST 2V
2 series · 2 of 2 positions shown · non-contrast
Comparison: May 13, 2018

CLINICAL DATA: Left chest pain

EXAM:
CHEST - 2 VIEW

[w chest pa]
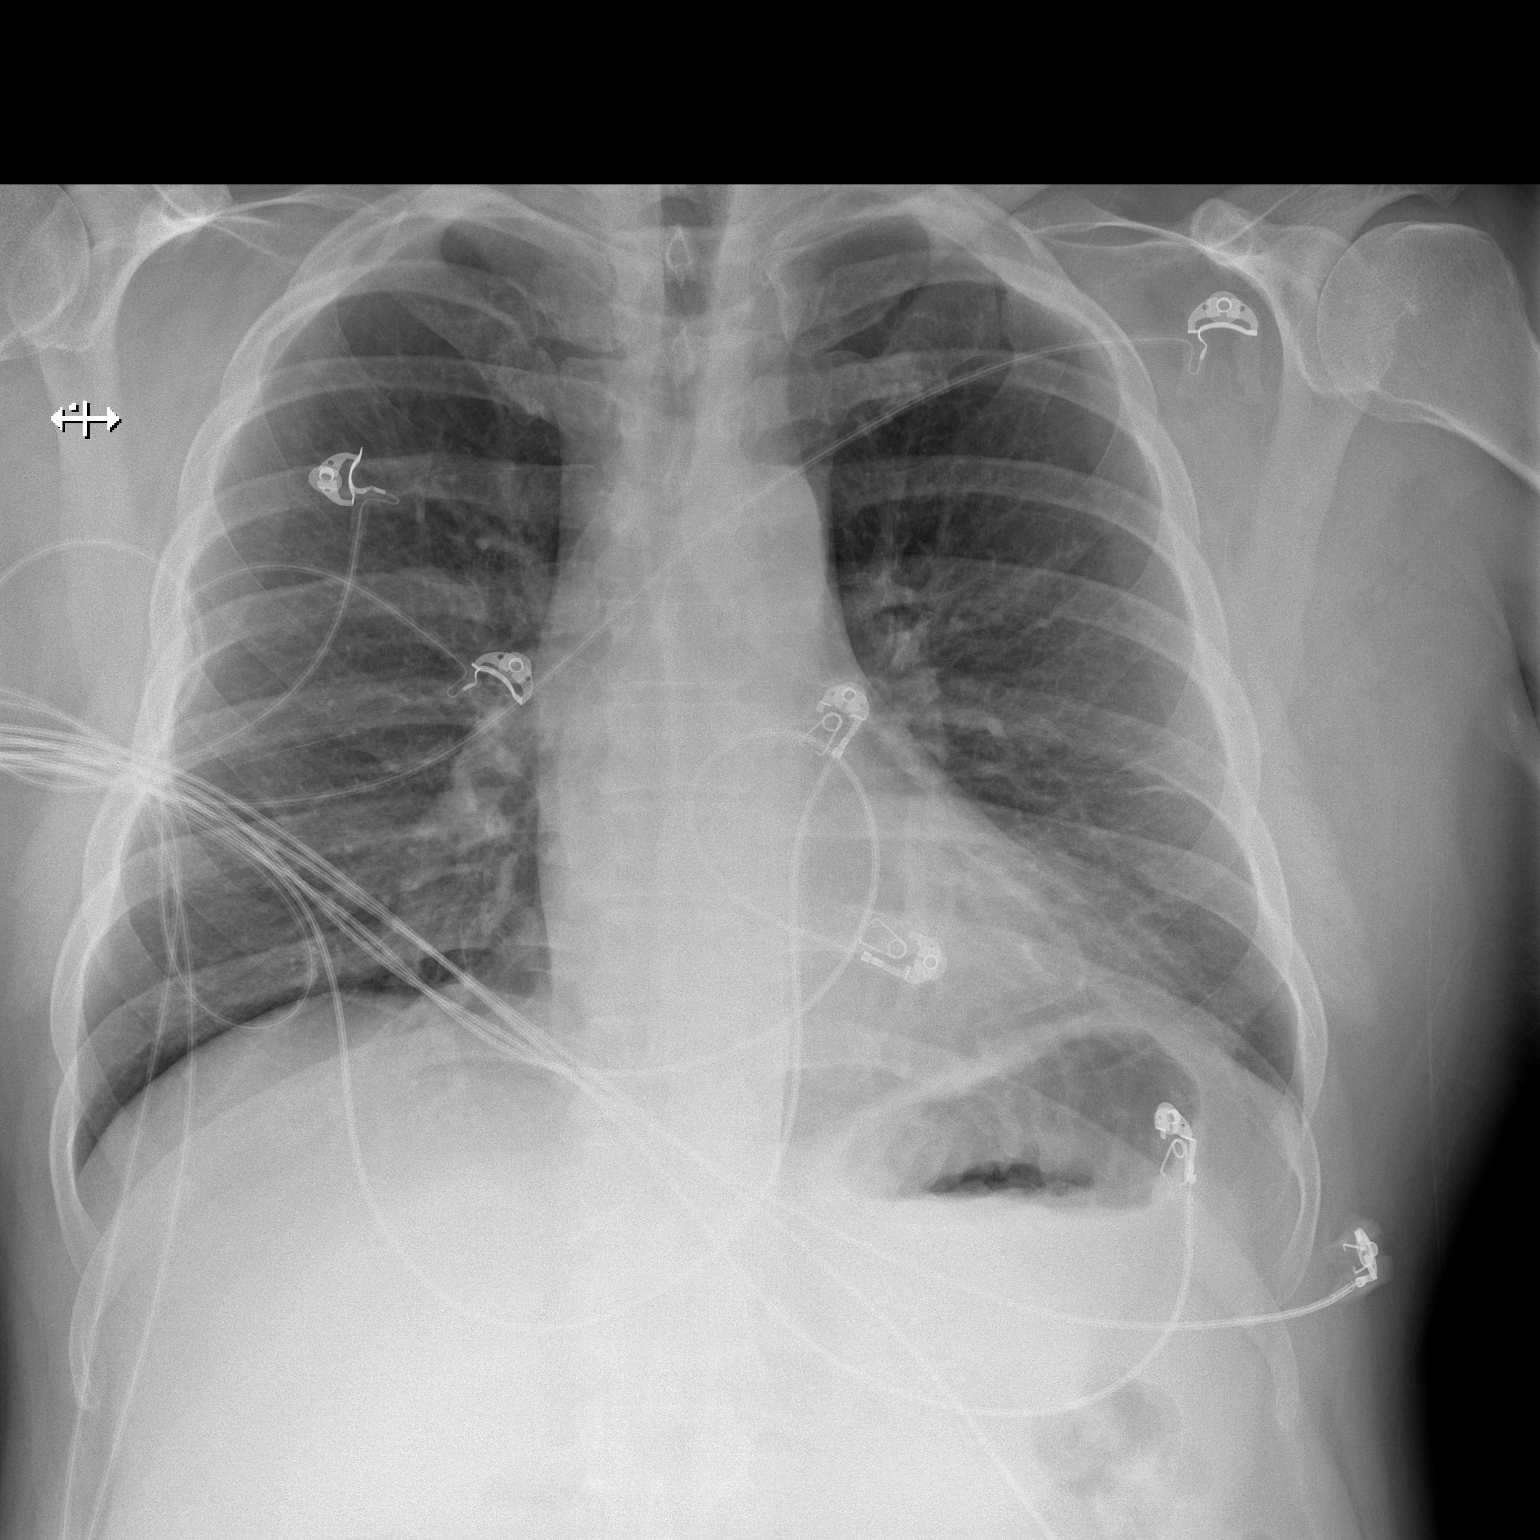

[w chest lat]
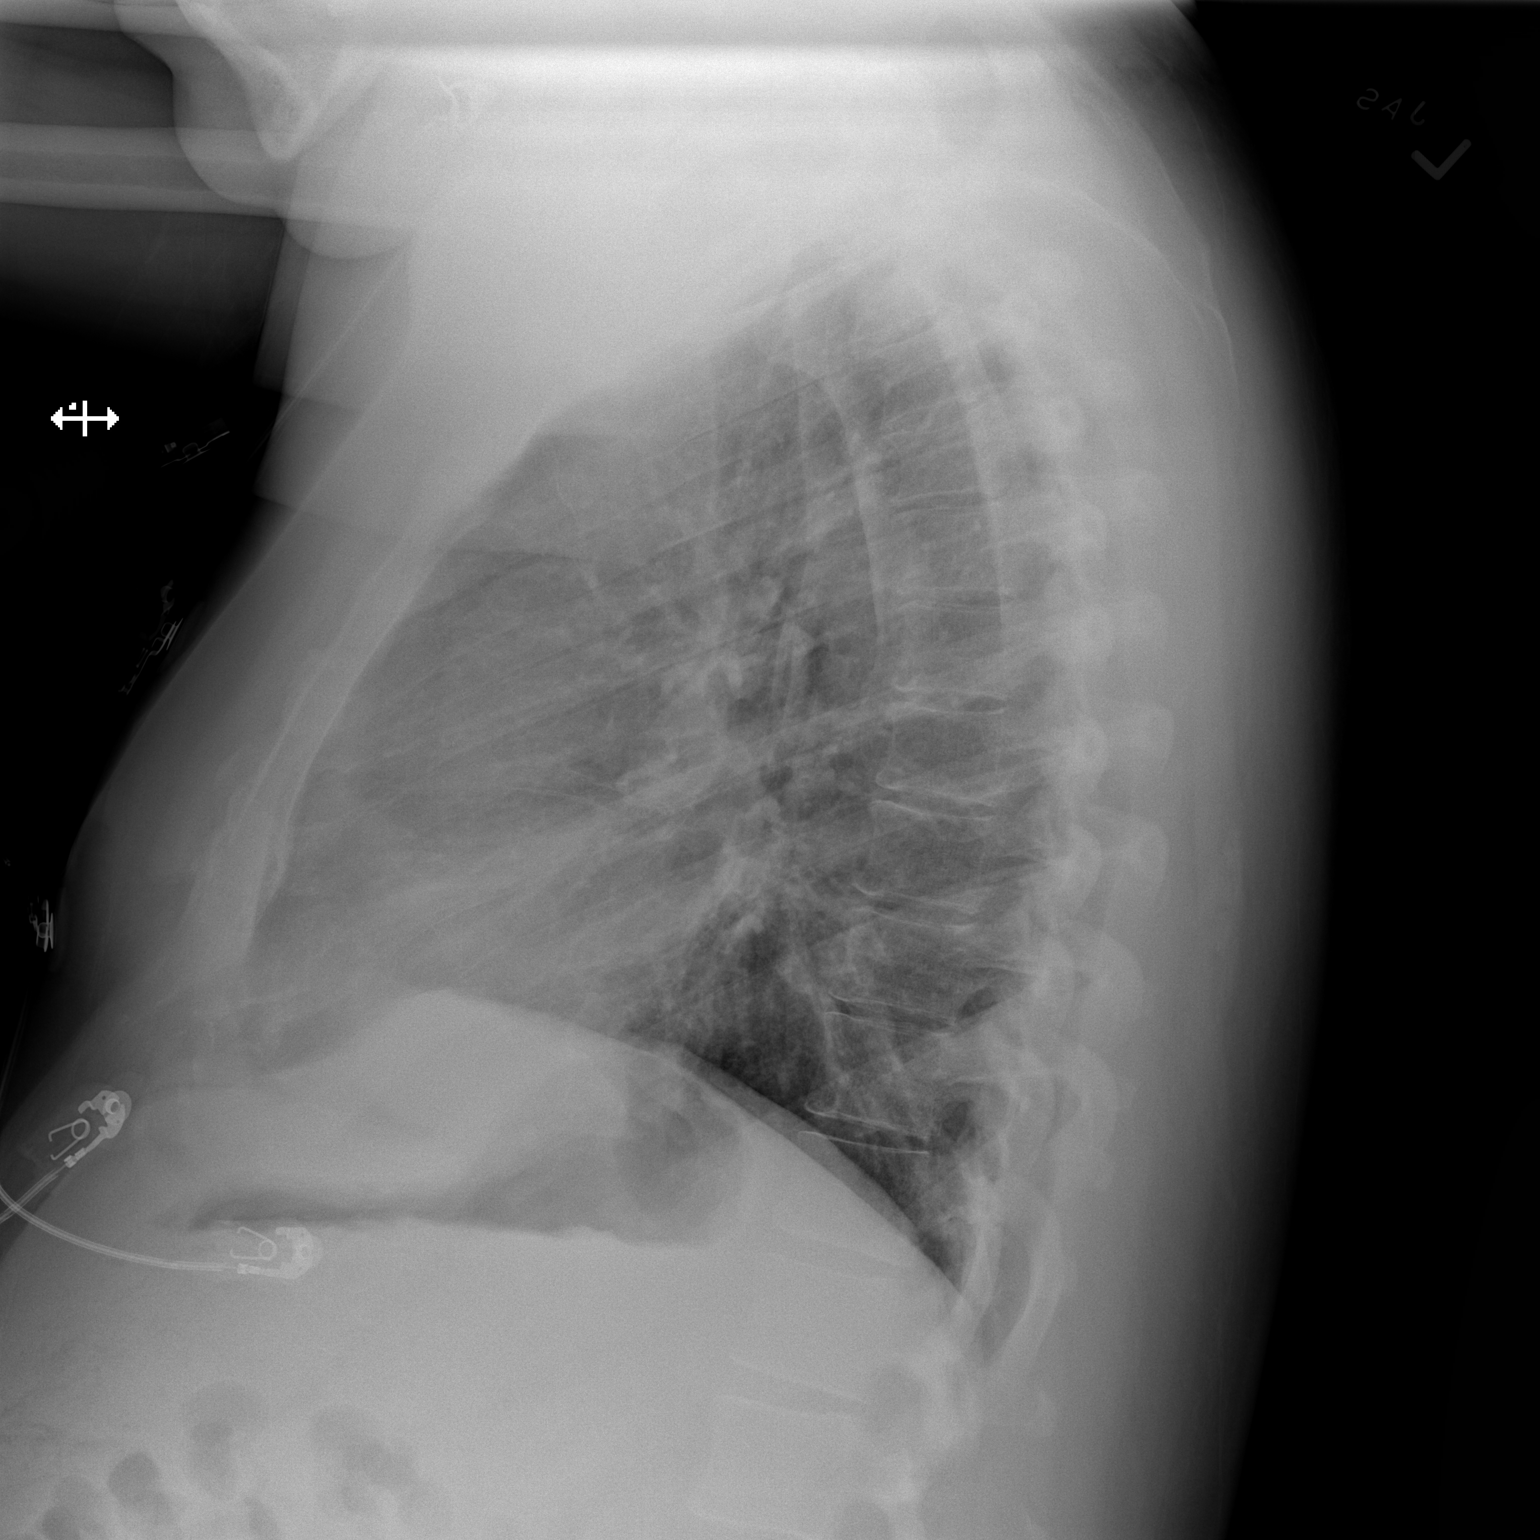

[2 of 2 positions shown; findings below may reference images not displayed]

FINDINGS: The heart size and mediastinal contours are within normal limits.
There is minor scar of the left lung base. There is no focal
infiltrate, pulmonary edema, or pleural effusion. The visualized
skeletal structures are unremarkable.
IMPRESSION: No active cardiopulmonary disease.

## 2018-10-08 ENCOUNTER — Encounter: Payer: Self-pay | Admitting: Emergency Medicine

## 2018-10-08 ENCOUNTER — Other Ambulatory Visit: Payer: Self-pay

## 2018-10-08 ENCOUNTER — Emergency Department
Admission: EM | Admit: 2018-10-08 | Discharge: 2018-10-08 | Disposition: A | Discharge status: Home / Self Care | Payer: Medicare HMO | Attending: Emergency Medicine | Admitting: Emergency Medicine

## 2018-10-08 DIAGNOSIS — Z7982 Long term (current) use of aspirin: Secondary | ICD-10-CM | POA: Diagnosis not present

## 2018-10-08 DIAGNOSIS — M5441 Lumbago with sciatica, right side: Secondary | ICD-10-CM | POA: Insufficient documentation

## 2018-10-08 DIAGNOSIS — Z7984 Long term (current) use of oral hypoglycemic drugs: Secondary | ICD-10-CM | POA: Diagnosis not present

## 2018-10-08 DIAGNOSIS — F17210 Nicotine dependence, cigarettes, uncomplicated: Secondary | ICD-10-CM | POA: Insufficient documentation

## 2018-10-08 DIAGNOSIS — G8929 Other chronic pain: Secondary | ICD-10-CM | POA: Diagnosis not present

## 2018-10-08 DIAGNOSIS — Z79899 Other long term (current) drug therapy: Secondary | ICD-10-CM | POA: Diagnosis not present

## 2018-10-08 DIAGNOSIS — E119 Type 2 diabetes mellitus without complications: Secondary | ICD-10-CM | POA: Insufficient documentation

## 2018-10-08 DIAGNOSIS — Z8673 Personal history of transient ischemic attack (TIA), and cerebral infarction without residual deficits: Secondary | ICD-10-CM | POA: Insufficient documentation

## 2018-10-08 DIAGNOSIS — F419 Anxiety disorder, unspecified: Secondary | ICD-10-CM | POA: Insufficient documentation

## 2018-10-08 DIAGNOSIS — I10 Essential (primary) hypertension: Secondary | ICD-10-CM | POA: Insufficient documentation

## 2018-10-08 DIAGNOSIS — M5442 Lumbago with sciatica, left side: Secondary | ICD-10-CM | POA: Diagnosis not present

## 2018-10-08 DIAGNOSIS — M545 Low back pain: Secondary | ICD-10-CM | POA: Diagnosis present

## 2018-10-08 MED ORDER — HYDROMORPHONE HCL 1 MG/ML IJ SOLN
1.0000 mg | Freq: Once | INTRAMUSCULAR | Status: AC
  Administered 2018-10-08: 1 mg via INTRAMUSCULAR
  Filled 2018-10-08: qty 1

## 2018-10-08 MED ORDER — MELOXICAM 15 MG PO TABS: 15.0000 mg | ORAL_TABLET | Freq: Every day | ORAL | 0 refills | Status: AC

## 2018-10-08 MED ORDER — PREDNISONE 20 MG PO TABS
60.0000 mg | ORAL_TABLET | Freq: Once | ORAL | Status: AC
  Administered 2018-10-08: 60 mg via ORAL
  Filled 2018-10-08: qty 3

## 2018-10-08 NOTE — Discharge Instructions (Addendum)
If any increasing pain fevers weakness loss of bowel or bladder symptoms return to the emergency department.  Take Mobic as prescribed.

## 2018-10-08 NOTE — ED Notes (Signed)
Pt c/o lower back pain. Pt has hx of degenerative disc disease, pt states pain started yesterday and has gotten worse.

## 2018-10-08 NOTE — ED Triage Notes (Signed)
PT states he has chronic degenerative bone/disk disease. Pt denies new injury but states last night the pain intensified.

## 2018-10-08 NOTE — ED Provider Notes (Addendum)
[facility] EMERGENCY DEPARTMENT Provider Note   CSN: [id number] Arrival date & time: 10/08/18  0867     History   Chief Complaint Chief Complaint  Patient presents with  . Back Pain    HPI Caleb [name] is a 42 y.o. male.  Presents today for evaluation of acute flareup of chronic low back pain.  Patient is had chronic back pain for years and is currently under a pain contract.  He takes oxycodone 15 mg every 4-6 hours as needed for pain.  Patient states last night he developed increasing pain in the middle of his lower back pain is the same pain that he normally has but more severe and oxycodone is not relieving it.  He is currently under pain contract.  He has taken some Aleve with no improvement.  He is diabetic, last A1c is 8.5.  Patient has some tingling in both buttocks and posterior thighs with no weakness.  There is no loss of bowel or bladder symptoms.  No fevers or abdominal pain.  He is ambulatory with no assistive devices.  HPI  Past Medical History:  Diagnosis Date  . Anxiety   . Diabetes mellitus without complication ([place])   . GERD (gastroesophageal reflux disease)   . History of degenerative disc disease   . Hypercholesteremia   . Hypertension   . Knee pain, bilateral   . PTSD (post-traumatic stress disorder)   . Pulmonary embolism ([place])   . Stroke [facility]) 2015   mold stroke per patient    Patient Active Problem List   Diagnosis Date Noted  . Leukocytosis 07/01/2018  . Chest pain 06/30/2018  . Hypertensive urgency 06/30/2018  . Hypokalemia 06/30/2018  . Fall 06/30/2018  . Anxiety   . Diabetes mellitus without complication ([place])   . GERD (gastroesophageal reflux disease)   . Hypertension   . Hypercholesteremia   . Unstable angina ([place]) 01/24/2017  . Diverticulitis 02/26/2016  . Diverticulitis of large intestine without perforation or abscess without bleeding   . Stroke [facility]) 11/20/2013    Past Surgical History:    Procedure Laterality Date  . COLONOSCOPY WITH PROPOFOL N/A 05/07/2017   Procedure: COLONOSCOPY WITH PROPOFOL;  Surgeon: Lollie Sails, MD;  Location: [facility] ENDOSCOPY;  Service: Endoscopy;  Laterality: N/A;  . ESOPHAGOGASTRODUODENOSCOPY (EGD) WITH PROPOFOL N/A 05/07/2017   Procedure: ESOPHAGOGASTRODUODENOSCOPY (EGD) WITH PROPOFOL;  Surgeon: Lollie Sails, MD;  Location: [facility] ENDOSCOPY;  Service: Endoscopy;  Laterality: N/A;  . HERNIA REPAIR    . Knee surgeries    . TONSILLECTOMY          Home Medications    Prior to Admission medications   Medication Sig Start Date End Date Taking? Authorizing Provider  albuterol (PROVENTIL HFA;VENTOLIN HFA) 108 (90 Base) MCG/ACT inhaler Inhale 2 puffs into the lungs every 6 (six) hours as needed for shortness of breath. 06/13/16   [provider]  aspirin EC 81 MG tablet Take 81 mg by mouth daily.    [provider]  aspirin-sod bicarb-citric acid (ALKA-SELTZER) 325 MG TBEF tablet Take 650 mg by mouth every 6 (six) hours as needed (indigestion).     [provider]  atorvastatin (LIPITOR) 80 MG tablet Take 80 mg by mouth at bedtime.  03/04/15   [provider]  b complex vitamins capsule Take 1 capsule by mouth daily.    [provider]  baclofen (LIORESAL) 10 MG tablet Take 2 tablets by mouth at bedtime.     [provider]  cetirizine (ZYRTEC) 10 MG tablet Take 10 mg by mouth daily.    [provider]  diazepam (VALIUM) 5 MG tablet Take 5 mg by mouth at bedtime.  01/14/15   [provider]  diclofenac sodium (VOLTAREN) 1 % GEL Apply 2 g topically daily as needed (pain).     [provider]  Diphenhyd-Hydrocort-Nystatin (FIRST-DUKES MOUTHWASH) SUSP Use as directed 5 mLs in the mouth or throat 3 (three) times daily. 05/14/18   Carrie Mew, MD  diphenhydrAMINE (BENADRYL) 25 mg capsule Take 50 mg by mouth every 6 (six) hours as needed for itching or allergies.     [provider]  esomeprazole (NEXIUM) 40 MG capsule Take 40 mg by mouth daily. 06/13/16 07/01/18  [provider]  fluticasone (FLONASE) 50 MCG/ACT nasal spray Place 2 sprays into the nose daily.    [provider]  guaiFENesin (ROBITUSSIN) 100 MG/5ML SOLN Take 5 mLs (100 mg total) by mouth every 4 (four) hours as needed for cough or to loosen phlegm. 05/14/18   Carrie Mew, MD  hydrOXYzine (VISTARIL) 50 MG capsule Take 50 mg by mouth daily. 06/04/17   [provider]  liraglutide (VICTOZA) 18 MG/3ML SOPN Inject 1.2 mg into the skin daily. 06/06/18   [provider]  losartan-hydrochlorothiazide (HYZAAR) 100-25 MG tablet Take 0.5 tablets by mouth daily. 07/02/18   Domenic Polite, MD  meloxicam (MOBIC) 15 MG tablet Take 1 tablet (15 mg total) by mouth daily for 7 days. 10/08/18 10/15/18  Duanne Guess, PA-C  Menthol, Topical Analgesic, (ICY HOT EX) Apply 1 application topically daily as needed (pain).    [provider]  metoprolol succinate (TOPROL-XL) 50 MG 24 hr tablet Take 50 mg by mouth daily. 12/24/17   [provider]  MONTELUKAST SODIUM PO Take 1 tablet by mouth daily.    [provider]  naproxen (NAPROSYN) 500 MG tablet Take 1 tablet (500 mg total) by mouth 2 (two) times daily with a meal. 04/22/17   Triplett, Cari B, FNP  oxyCODONE (ROXICODONE) 15 MG immediate release tablet Take 15 mg by mouth 5 (five) times daily. For pain. 09/25/15   [provider]  oxymetazoline (AFRIN) 0.05 % nasal spray Place 1 spray into both nostrils 2 (two) times daily as needed for congestion.    [provider]  Phenylephrine-Pheniramine-DM [facility] COLD & COUGH PO) Take 2 capsules by mouth every 4 (four) hours as needed (Cough and cold).    [provider]  PROMETHAZINE HCL PO Take by mouth.    [provider]    Family History Family History  Problem Relation Age of Onset  . Diabetes Mother   .  Cancer Paternal Grandmother   . Cancer Paternal Grandfather   . Heart attack Maternal Grandfather 59    Social History Social History   Tobacco Use  . Smoking status: Current Every Day Smoker    Packs/day: 1.00    Years: 20.00    Pack years: 20.00    Types: Cigarettes  . Smokeless tobacco: Never Used  Substance Use Topics  . Alcohol use: Yes    Alcohol/week: 4.0 standard drinks    Types: 4 Cans of beer per week    Comment: occas.   . Drug use: No     Allergies   Bee venom; Contrast media [iodinated diagnostic agents]; Metformin and related; and Lisinopril   Review of Systems Review of Systems  Constitutional: Negative for fever.  Respiratory: Negative for  shortness of breath.   Cardiovascular: Negative for chest pain.  Gastrointestinal: Negative for abdominal pain.  Genitourinary: Negative for difficulty urinating, dysuria and urgency.  Musculoskeletal: Positive for back pain. Negative for myalgias.  Skin: Negative for rash.  Neurological: Negative for dizziness and headaches.     Physical Exam Updated Vital Signs BP (!) 150/79 (BP Location: Right Arm)   Pulse 84   Temp 97.9 F (36.6 C) (Oral)   Resp 18   Ht 6' (1.829 m)   Wt 108.9 kg   SpO2 96%   BMI 32.55 kg/m   Physical Exam  Constitutional: He is oriented to person, place, and time. He appears well-developed and well-nourished.  HENT:  Head: Normocephalic and atraumatic.  Eyes: Conjunctivae are normal.  Neck: Normal range of motion.  Cardiovascular: Normal rate.  Pulmonary/Chest: Effort normal. No respiratory distress.  Musculoskeletal:  Lumbar Spine: Examination of the lumbar spine reveals no bony abnormality, no edema, and no ecchymosis.  There is no step off.  Decreased range of motion with lumbar flexion and extension.  Pain with lumbar extension.  Mild pain with lumbar flexion.  The patient is non tender along the spinous process.  The patient is tender along the left and right paravertebral  muscles of the lumbar spine..  The patient is non tender along the iliac crest.  The patient is non tender in the sciatic notch.  The patient is non tender along the Sacroiliac joint.  There is no Coccyx joint tenderness.    Bilateral Lower Extremities: Examination of the lower extremities reveals no bony abnormality, no edema, and no ecchymosis.  The patient has full active and passive range of motion of the hips, knees, and ankles.  There is no discomfort with range of motion exercises.  The patient is non tender along the greater trochanter region.  The patient has a negative Bevelyn Buckles' test bilaterally.  There is normal skin warmth.  There is normal capillary refill bilaterally.    Neurologic: The patient has a negative straight leg raise.  The patient has normal muscle strength testing for the quadriceps, calves, ankle dorsiflexion, ankle plantarflexion, and extensor hallicus longus.  The patient has sensation that is intact to light touch.  Normal patella reflexes bilaterally.  No clonus noted.    Neurological: He is alert and oriented to person, place, and time.  Skin: Skin is warm. No rash noted.  Psychiatric: He has a normal mood and affect. His behavior is normal. Thought content normal.     ED Treatments / Results  Labs (all labs ordered are listed, but only abnormal results are displayed) Labs Reviewed - No data to display  EKG None  Radiology No results found.  Procedures Procedures (including critical care time)  Medications Ordered in ED Medications  HYDROmorphone (DILAUDID) injection 1 mg (1 mg Intramuscular Given 10/08/18 1026)  predniSONE (DELTASONE) tablet 60 mg (60 mg Oral Given 10/08/18 1025)     Initial Impression / Assessment and Plan / ED Course  I have reviewed the triage vital signs and the nursing notes.  Pertinent labs & imaging results that were available during my care of the patient were reviewed by me and considered in my medical decision making (see  chart for details).     42 year old male with acute on chronic nontraumatic low back pain with mild radicular symptoms.  No weakness or neurological deficits.  Vital signs are stable.  Afebrile.  Patient is currently under pain contract.  He is given 1 shot of  Dilaudid 1 mg IM along with 60 mg of prednisone p.o.  He is sent home with meloxicam 15 mg x 7 days.  He understands signs symptoms return to ED for.  He will follow-up with his pain management provider.  Final Clinical Impressions(s) / ED Diagnoses   Final diagnoses:  Chronic midline low back pain with bilateral sciatica    ED Discharge Orders         Ordered    meloxicam (MOBIC) 15 MG tablet  Daily     10/08/18 1051           Duanne Guess, [place] 10/08/18 1055    Duanne Guess, PA-C 10/08/18 1056    Lisa Roca, MD 10/08/18 1558

## 2019-03-06 DIAGNOSIS — F5104 Psychophysiologic insomnia: Secondary | ICD-10-CM | POA: Insufficient documentation

## 2019-05-20 ENCOUNTER — Other Ambulatory Visit: Payer: Self-pay

## 2019-05-20 ENCOUNTER — Encounter: Payer: Self-pay | Admitting: Family Medicine

## 2019-05-20 ENCOUNTER — Telehealth: Payer: Self-pay

## 2019-05-20 DIAGNOSIS — Z860100 Personal history of colon polyps, unspecified: Secondary | ICD-10-CM

## 2019-05-20 DIAGNOSIS — Z1211 Encounter for screening for malignant neoplasm of colon: Secondary | ICD-10-CM

## 2019-05-20 NOTE — Telephone Encounter (Signed)
Gastroenterology Pre-Procedure Review  Request Date: 06/02/19 Requesting Physician: Dr. Vicente Males  PATIENT REVIEW QUESTIONS: The patient responded to the following health history questions as indicated:    Concerns to Note:Patient has PTSD and would like prior to anesthesia something to help with keeping him calm  1. Are you having any GI issues? no 2. Do you have a personal history of Polyps? yes (2018 Colonoscopy with Dr. Donnie Mesa multiple polyps noted) 3. Do you have a family history of Colon Cancer or Polyps? no 4. Diabetes Mellitus? yes (takes victoza) 5. Joint replacements in the past 12 months?no 6. Major health problems in the past 3 months?no 7. Any artificial heart valves, MVP, or defibrillator?no    MEDICATIONS & ALLERGIES:    Patient reports the following regarding taking any anticoagulation/antiplatelet therapy:   Plavix, Coumadin, Eliquis, Xarelto, Lovenox, Pradaxa, Brilinta, or Effient? no Aspirin? Yes 81   mg  Patient confirms/reports the following medications:  Current Outpatient Medications  Medication Sig Dispense Refill  . albuterol (PROVENTIL HFA;VENTOLIN HFA) 108 (90 Base) MCG/ACT inhaler Inhale 2 puffs into the lungs every 6 (six) hours as needed for shortness of breath.    Marland Kitchen aspirin EC 81 MG tablet Take 81 mg by mouth daily.    Marland Kitchen aspirin-sod bicarb-citric acid (ALKA-SELTZER) 325 MG TBEF tablet Take 650 mg by mouth every 6 (six) hours as needed (indigestion).     Marland Kitchen atorvastatin (LIPITOR) 80 MG tablet Take 80 mg by mouth at bedtime.   3  . b complex vitamins capsule Take 1 capsule by mouth daily.    . baclofen (LIORESAL) 10 MG tablet Take 2 tablets by mouth at bedtime.     . cetirizine (ZYRTEC) 10 MG tablet Take 10 mg by mouth daily.    . diazepam (VALIUM) 5 MG tablet Take 5 mg by mouth at bedtime.     . diclofenac sodium (VOLTAREN) 1 % GEL Apply 2 g topically daily as needed (pain).     . Diphenhyd-Hydrocort-Nystatin (FIRST-DUKES MOUTHWASH) SUSP Use as directed 5  mLs in the mouth or throat 3 (three) times daily. 1 Bottle 0  . diphenhydrAMINE (BENADRYL) 25 mg capsule Take 50 mg by mouth every 6 (six) hours as needed for itching or allergies.    Marland Kitchen esomeprazole (NEXIUM) 40 MG capsule Take 40 mg by mouth daily.    . fluticasone (FLONASE) 50 MCG/ACT nasal spray Place 2 sprays into the nose daily.    Marland Kitchen guaiFENesin (ROBITUSSIN) 100 MG/5ML SOLN Take 5 mLs (100 mg total) by mouth every 4 (four) hours as needed for cough or to loosen phlegm. 120 mL 0  . hydrOXYzine (VISTARIL) 50 MG capsule Take 50 mg by mouth daily.    Marland Kitchen liraglutide (VICTOZA) 18 MG/3ML SOPN Inject 1.2 mg into the skin daily.    Marland Kitchen losartan-hydrochlorothiazide (HYZAAR) 100-25 MG tablet Take 0.5 tablets by mouth daily.  11  . Menthol, Topical Analgesic, (ICY HOT EX) Apply 1 application topically daily as needed (pain).    . metoprolol succinate (TOPROL-XL) 50 MG 24 hr tablet Take 50 mg by mouth daily.    Marland Kitchen MONTELUKAST SODIUM PO Take 1 tablet by mouth daily.    . naproxen (NAPROSYN) 500 MG tablet Take 1 tablet (500 mg total) by mouth 2 (two) times daily with a meal. 30 tablet 0  . oxyCODONE (ROXICODONE) 15 MG immediate release tablet Take 15 mg by mouth 5 (five) times daily. For pain.  0  . oxymetazoline (AFRIN) 0.05 % nasal spray Place 1 spray  into both nostrils 2 (two) times daily as needed for congestion.    Marland Kitchen Phenylephrine-Pheniramine-DM (THERAFLU COLD & COUGH PO) Take 2 capsules by mouth every 4 (four) hours as needed (Cough and cold).    Marland Kitchen PROMETHAZINE HCL PO Take by mouth.     No current facility-administered medications for this visit.     Patient confirms/reports the following allergies:  Allergies  Allergen Reactions  . Bee Venom Anaphylaxis  . Contrast Media [Iodinated Diagnostic Agents] Anaphylaxis  . Metformin And Related Anaphylaxis    Unknown   . Lisinopril Swelling    No orders of the defined types were placed in this encounter.   AUTHORIZATION INFORMATION Primary  Insurance: 1D#: Group #:  Secondary Insurance: 1D#: Group #:  SCHEDULE INFORMATION: Date: 06/02/19 Time: Location:[place]

## 2019-05-29 ENCOUNTER — Other Ambulatory Visit: Admission: RE | Admit: 2019-05-29 | Payer: Medicare HMO | Source: Ambulatory Visit

## 2019-05-30 ENCOUNTER — Telehealth: Payer: Self-pay

## 2019-05-30 NOTE — Telephone Encounter (Signed)
Left vm informing pt his colonoscopy had been cancelled due to his COVID testing not being done. Advised pt to contact our office to reschedule.

## 2019-06-02 ENCOUNTER — Telehealth: Payer: Self-pay

## 2019-06-02 ENCOUNTER — Ambulatory Visit: Admission: RE | Admit: 2019-06-02 | Payer: Medicare HMO | Source: Home / Self Care | Admitting: Gastroenterology

## 2019-06-02 ENCOUNTER — Encounter: Admission: RE | Payer: Self-pay | Source: Home / Self Care

## 2019-06-02 SURGERY — COLONOSCOPY WITH PROPOFOL
Anesthesia: General

## 2019-06-02 NOTE — Telephone Encounter (Signed)
Patient states that he will call office back to reschedule his procedure.  Thanks [employer]

## 2019-09-22 DIAGNOSIS — R413 Other amnesia: Secondary | ICD-10-CM | POA: Insufficient documentation

## 2019-09-22 DIAGNOSIS — R739 Hyperglycemia, unspecified: Secondary | ICD-10-CM | POA: Insufficient documentation

## 2019-09-22 DIAGNOSIS — F1190 Opioid use, unspecified, uncomplicated: Secondary | ICD-10-CM | POA: Insufficient documentation

## 2019-09-22 DIAGNOSIS — F1090 Alcohol use, unspecified, uncomplicated: Secondary | ICD-10-CM | POA: Insufficient documentation

## 2019-09-23 DIAGNOSIS — I2510 Atherosclerotic heart disease of native coronary artery without angina pectoris: Secondary | ICD-10-CM | POA: Insufficient documentation

## 2019-09-23 DIAGNOSIS — R739 Hyperglycemia, unspecified: Secondary | ICD-10-CM | POA: Insufficient documentation

## 2019-09-23 DIAGNOSIS — I25110 Atherosclerotic heart disease of native coronary artery with unstable angina pectoris: Secondary | ICD-10-CM | POA: Insufficient documentation

## 2019-09-23 DIAGNOSIS — Z955 Presence of coronary angioplasty implant and graft: Secondary | ICD-10-CM | POA: Insufficient documentation

## 2019-09-30 DIAGNOSIS — F1020 Alcohol dependence, uncomplicated: Secondary | ICD-10-CM | POA: Insufficient documentation

## 2019-09-30 DIAGNOSIS — F1011 Alcohol abuse, in remission: Secondary | ICD-10-CM | POA: Insufficient documentation

## 2020-06-25 ENCOUNTER — Emergency Department
Admission: EM | Admit: 2020-06-25 | Discharge: 2020-06-25 | Disposition: A | Discharge status: Home / Self Care | Payer: Medicare HMO | Attending: Emergency Medicine | Admitting: Emergency Medicine

## 2020-06-25 ENCOUNTER — Other Ambulatory Visit: Payer: Self-pay

## 2020-06-25 ENCOUNTER — Emergency Department: Payer: Medicare HMO

## 2020-06-25 DIAGNOSIS — R110 Nausea: Secondary | ICD-10-CM | POA: Insufficient documentation

## 2020-06-25 DIAGNOSIS — R0602 Shortness of breath: Secondary | ICD-10-CM | POA: Diagnosis not present

## 2020-06-25 DIAGNOSIS — R0789 Other chest pain: Secondary | ICD-10-CM | POA: Diagnosis present

## 2020-06-25 DIAGNOSIS — Z5321 Procedure and treatment not carried out due to patient leaving prior to being seen by health care provider: Secondary | ICD-10-CM | POA: Diagnosis not present

## 2020-06-25 DIAGNOSIS — R42 Dizziness and giddiness: Secondary | ICD-10-CM | POA: Insufficient documentation

## 2020-06-25 HISTORY — DX: Acute myocardial infarction, unspecified: I21.9

## 2020-06-25 LAB — BASIC METABOLIC PANEL
Anion gap: 12 (ref 5–15)
BUN: 14 mg/dL (ref 6–20)
CO2: 25 mmol/L (ref 22–32)
Calcium: 9.4 mg/dL (ref 8.9–10.3)
Chloride: 102 mmol/L (ref 98–111)
Creatinine, Ser: 0.85 mg/dL (ref 0.61–1.24)
GFR calc Af Amer: >60 mL/min (ref >60)
GFR calc non Af Amer: >60 mL/min (ref >60)
Glucose, Bld: 195 mg/dL — ABNORMAL HIGH (ref 70–99)
Potassium: 3.5 mmol/L (ref 3.5–5.1)
Sodium: 139 mmol/L (ref 135–145)

## 2020-06-25 LAB — CBC
HCT: 47.8 % (ref 39.0–52.0)
Hemoglobin: 17.3 g/dL — ABNORMAL HIGH (ref 13.0–17.0)
MCH: 32.6 pg (ref 26.0–34.0)
MCHC: 36.2 g/dL — ABNORMAL HIGH (ref 30.0–36.0)
MCV: 90.2 fL (ref 80.0–100.0)
Platelets: 263 10*3/uL (ref 150–400)
RBC: 5.3 MIL/uL (ref 4.22–5.81)
RDW: 12.4 % (ref 11.5–15.5)
WBC: 9.1 10*3/uL (ref 4.0–10.5)
nRBC: 0 % (ref 0.0–0.2)

## 2020-06-25 LAB — TROPONIN I (HIGH SENSITIVITY): Troponin I (High Sensitivity): 6 ng/L (ref <18)

## 2020-06-25 MED ORDER — ASPIRIN 81 MG PO CHEW
324.0000 mg | CHEWABLE_TABLET | Freq: Once | ORAL | Status: AC
  Administered 2020-06-25: 324 mg via ORAL
  Filled 2020-06-25: qty 4

## 2020-06-25 MED ORDER — ONDANSETRON 4 MG PO TBDP
4.0000 mg | ORAL_TABLET | Freq: Once | ORAL | Status: AC
  Administered 2020-06-25: 4 mg via ORAL
  Filled 2020-06-25: qty 1

## 2020-06-25 NOTE — ED Triage Notes (Signed)
Patient c/o left chest pain radiating to neck and left arm. Patient reports accompanying symptoms of SOB, nausea, lightheadedness.

## 2020-06-25 NOTE — ED Notes (Signed)
Pt reports he is going to [facility] "I can get seen faster there, they have all my information RN asked pt to obtain permission to perform repeat troponin pt declined. Removed IV.

## 2020-06-25 NOTE — ED Notes (Signed)
Pt reports he has been moving from his residence and did not remember what box he put his medications reports has not taken any of his medications for past 2 days, RN spoke with mbr to encourage him to stay and allow RN to do blood work, pt reports he wants to leave.

## 2020-06-25 NOTE — ED Notes (Signed)
Patient transported to X-ray 

## 2020-06-25 NOTE — ED Notes (Signed)
Lab called for repeat troponin.

## 2020-07-12 ENCOUNTER — Other Ambulatory Visit: Payer: Self-pay

## 2020-07-12 ENCOUNTER — Telehealth (INDEPENDENT_AMBULATORY_CARE_PROVIDER_SITE_OTHER): Payer: Self-pay | Admitting: Gastroenterology

## 2020-07-12 DIAGNOSIS — Z86010 Personal history of colonic polyps: Secondary | ICD-10-CM

## 2020-07-12 NOTE — Progress Notes (Signed)
Gastroenterology Pre-Procedure Review  Request Date: 07/29/20 Requesting Physician: Dr. Bonna Gains  PATIENT REVIEW QUESTIONS: The patient responded to the following health history questions as indicated:    1. Are you having any GI issues? no 2. Do you have a personal history of Polyps? yes (05/07/17 colonoscopy performed by Dr. Gustavo Lah) 3. Do you have a family history of Colon Cancer or Polyps? no 4. Diabetes Mellitus? yes (type 2) 5. Joint replacements in the past 12 months?no 6. Major health problems in the past 3 months?no 7. Any artificial heart valves, MVP, or defibrillator?no    MEDICATIONS & ALLERGIES:    Patient reports the following regarding taking any anticoagulation/antiplatelet therapy:   Plavix, Coumadin, Eliquis, Xarelto, Lovenox, Pradaxa, Brilinta, or Effient?yes Plavix prescribed by Dr. Jasper Loser blood thinner advice faxed through [facility] Aspirin? yes (81 mg daily)  Patient confirms/reports the following medications:  Current Outpatient Medications  Medication Sig Dispense Refill   albuterol (PROVENTIL HFA;VENTOLIN HFA) 108 (90 Base) MCG/ACT inhaler Inhale 2 puffs into the lungs every 6 (six) hours as needed for shortness of breath.     aspirin EC 81 MG tablet Take 81 mg by mouth daily.     aspirin-sod bicarb-citric acid (ALKA-SELTZER) 325 MG TBEF tablet Take 650 mg by mouth every 6 (six) hours as needed (indigestion).      atorvastatin (LIPITOR) 80 MG tablet Take 80 mg by mouth at bedtime.   3   b complex vitamins capsule Take 1 capsule by mouth daily.     cetirizine (ZYRTEC) 10 MG tablet Take 10 mg by mouth daily.     diazepam (VALIUM) 5 MG tablet Take 5 mg by mouth at bedtime.      diclofenac sodium (VOLTAREN) 1 % GEL Apply 2 g topically daily as needed (pain).      diphenhydrAMINE (BENADRYL) 25 mg capsule Take 50 mg by mouth every 6 (six) hours as needed for itching or allergies.     fluticasone (FLONASE) 50 MCG/ACT nasal spray Place 2 sprays into the nose  daily.     hydrOXYzine (VISTARIL) 50 MG capsule Take 50 mg by mouth daily.     losartan-hydrochlorothiazide (HYZAAR) 100-25 MG tablet Take 0.5 tablets by mouth daily.  11   Menthol, Topical Analgesic, (ICY HOT EX) Apply 1 application topically daily as needed (pain).     metoprolol succinate (TOPROL-XL) 50 MG 24 hr tablet Take 50 mg by mouth daily.     naproxen (NAPROSYN) 500 MG tablet Take 1 tablet (500 mg total) by mouth 2 (two) times daily with a meal. 30 tablet 0   oxyCODONE (ROXICODONE) 15 MG immediate release tablet Take 15 mg by mouth 5 (five) times daily. For pain.  0   baclofen (LIORESAL) 10 MG tablet Take 2 tablets by mouth at bedtime.  (Patient not taking: Reported on 07/12/2020)     carvedilol (COREG) 6.25 MG tablet Take 6.25 mg by mouth 2 (two) times daily.     clopidogrel (PLAVIX) 75 MG tablet Take 75 mg by mouth daily.     Diphenhyd-Hydrocort-Nystatin (FIRST-DUKES MOUTHWASH) SUSP Use as directed 5 mLs in the mouth or throat 3 (three) times daily. (Patient not taking: Reported on 07/12/2020) 1 Bottle 0   glimepiride (AMARYL) 2 MG tablet Take 2 mg by mouth daily.     guaiFENesin (ROBITUSSIN) 100 MG/5ML SOLN Take 5 mLs (100 mg total) by mouth every 4 (four) hours as needed for cough or to loosen phlegm. (Patient not taking: Reported on 07/12/2020) 120 mL 0  liraglutide (VICTOZA) 18 MG/3ML SOPN Inject 1.2 mg into the skin daily. (Patient not taking: Reported on 07/12/2020)     MONTELUKAST SODIUM PO Take 1 tablet by mouth daily. (Patient not taking: Reported on 07/12/2020)     oxymetazoline (AFRIN) 0.05 % nasal spray Place 1 spray into both nostrils 2 (two) times daily as needed for congestion. (Patient not taking: Reported on 07/12/2020)     Phenylephrine-Pheniramine-DM [facility] COLD & COUGH PO) Take 2 capsules by mouth every 4 (four) hours as needed (Cough and cold). (Patient not taking: Reported on 07/12/2020)     PROMETHAZINE HCL PO Take by mouth. (Patient not taking:  Reported on 07/12/2020)     No current facility-administered medications for this visit.    Patient confirms/reports the following allergies:  Allergies  Allergen Reactions   Bee Venom Anaphylaxis   Iodinated Diagnostic Agents Anaphylaxis and Swelling   Metformin And Related Anaphylaxis    Unknown    Lisinopril Swelling    No orders of the defined types were placed in this encounter.   AUTHORIZATION INFORMATION Primary Insurance: 1D#: Group #:  Secondary Insurance: 1D#: Group #:  SCHEDULE INFORMATION: Date: 07/29/20 Time: Location:[facility]

## 2020-07-15 ENCOUNTER — Telehealth: Payer: Self-pay

## 2020-07-15 NOTE — Telephone Encounter (Signed)
Blood thinner advice received from Dr. Heide Spark.  Dr. Heide Spark advised to postpone  colonoscopy until after November 2021 unless it could be done while on Plavix.  Colonoscopy has been canceled until patient can hold Plavix to allow for procedure to be done.  Thanks,  , [place]

## 2020-07-29 ENCOUNTER — Ambulatory Visit: Admit: 2020-07-29 | Payer: Medicare HMO | Admitting: Gastroenterology

## 2020-07-29 SURGERY — COLONOSCOPY WITH PROPOFOL
Anesthesia: General

## 2020-12-16 DIAGNOSIS — G894 Chronic pain syndrome: Secondary | ICD-10-CM | POA: Insufficient documentation

## 2021-03-03 DIAGNOSIS — E1142 Type 2 diabetes mellitus with diabetic polyneuropathy: Secondary | ICD-10-CM | POA: Insufficient documentation

## 2021-03-18 ENCOUNTER — Emergency Department: Payer: Medicare HMO

## 2021-03-18 ENCOUNTER — Other Ambulatory Visit: Payer: Self-pay

## 2021-03-18 ENCOUNTER — Emergency Department
Admission: EM | Admit: 2021-03-18 | Discharge: 2021-03-18 | Disposition: A | Discharge status: Home / Self Care | Payer: Medicare HMO | Attending: Emergency Medicine | Admitting: Emergency Medicine

## 2021-03-18 DIAGNOSIS — Z20822 Contact with and (suspected) exposure to covid-19: Secondary | ICD-10-CM | POA: Insufficient documentation

## 2021-03-18 DIAGNOSIS — R0789 Other chest pain: Secondary | ICD-10-CM | POA: Diagnosis not present

## 2021-03-18 DIAGNOSIS — F17210 Nicotine dependence, cigarettes, uncomplicated: Secondary | ICD-10-CM | POA: Insufficient documentation

## 2021-03-18 DIAGNOSIS — Z7984 Long term (current) use of oral hypoglycemic drugs: Secondary | ICD-10-CM | POA: Insufficient documentation

## 2021-03-18 DIAGNOSIS — Z7951 Long term (current) use of inhaled steroids: Secondary | ICD-10-CM | POA: Insufficient documentation

## 2021-03-18 DIAGNOSIS — E1165 Type 2 diabetes mellitus with hyperglycemia: Secondary | ICD-10-CM | POA: Insufficient documentation

## 2021-03-18 DIAGNOSIS — Z7982 Long term (current) use of aspirin: Secondary | ICD-10-CM | POA: Insufficient documentation

## 2021-03-18 DIAGNOSIS — R5383 Other fatigue: Secondary | ICD-10-CM | POA: Diagnosis not present

## 2021-03-18 DIAGNOSIS — Z7902 Long term (current) use of antithrombotics/antiplatelets: Secondary | ICD-10-CM | POA: Insufficient documentation

## 2021-03-18 DIAGNOSIS — R0602 Shortness of breath: Secondary | ICD-10-CM | POA: Diagnosis not present

## 2021-03-18 DIAGNOSIS — I10 Essential (primary) hypertension: Secondary | ICD-10-CM | POA: Diagnosis not present

## 2021-03-18 DIAGNOSIS — J45909 Unspecified asthma, uncomplicated: Secondary | ICD-10-CM | POA: Diagnosis not present

## 2021-03-18 DIAGNOSIS — I25119 Atherosclerotic heart disease of native coronary artery with unspecified angina pectoris: Secondary | ICD-10-CM | POA: Insufficient documentation

## 2021-03-18 DIAGNOSIS — R519 Headache, unspecified: Secondary | ICD-10-CM | POA: Insufficient documentation

## 2021-03-18 DIAGNOSIS — R6883 Chills (without fever): Secondary | ICD-10-CM | POA: Insufficient documentation

## 2021-03-18 LAB — CBC
HCT: 45.5 % (ref 39.0–52.0)
Hemoglobin: 16.5 g/dL (ref 13.0–17.0)
MCH: 31.9 pg (ref 26.0–34.0)
MCHC: 36.3 g/dL — ABNORMAL HIGH (ref 30.0–36.0)
MCV: 88 fL (ref 80.0–100.0)
Platelets: 210 10*3/uL (ref 150–400)
RBC: 5.17 MIL/uL (ref 4.22–5.81)
RDW: 11.6 % (ref 11.5–15.5)
WBC: 8.5 10*3/uL (ref 4.0–10.5)
nRBC: 0 % (ref 0.0–0.2)

## 2021-03-18 LAB — BASIC METABOLIC PANEL
Anion gap: 11 (ref 5–15)
BUN: 11 mg/dL (ref 6–20)
CO2: 22 mmol/L (ref 22–32)
Calcium: 9.1 mg/dL (ref 8.9–10.3)
Chloride: 100 mmol/L (ref 98–111)
Creatinine, Ser: 0.8 mg/dL (ref 0.61–1.24)
GFR, Estimated: >60 mL/min (ref >60)
Glucose, Bld: 471 mg/dL — ABNORMAL HIGH (ref 70–99)
Potassium: 3.6 mmol/L (ref 3.5–5.1)
Sodium: 133 mmol/L — ABNORMAL LOW (ref 135–145)

## 2021-03-18 LAB — POC SARS CORONAVIRUS 2 AG -  ED: SARSCOV2ONAVIRUS 2 AG: NEGATIVE

## 2021-03-18 LAB — CBG MONITORING, ED
Glucose-Capillary: 480 mg/dL — ABNORMAL HIGH (ref 70–99)
Glucose-Capillary: 240 mg/dL — ABNORMAL HIGH (ref 70–99)

## 2021-03-18 LAB — TROPONIN I (HIGH SENSITIVITY)
Troponin I (High Sensitivity): 4 ng/L (ref <18)
Troponin I (High Sensitivity): 5 ng/L (ref <18)

## 2021-03-18 MED ORDER — ACETAMINOPHEN 325 MG PO TABS
ORAL_TABLET | ORAL | Status: AC
  Filled 2021-03-18: qty 2

## 2021-03-18 MED ORDER — FAMOTIDINE 20 MG PO TABS
40.0000 mg | ORAL_TABLET | Freq: Once | ORAL | Status: AC
  Administered 2021-03-18: 40 mg via ORAL
  Filled 2021-03-18: qty 2

## 2021-03-18 MED ORDER — ACETAMINOPHEN 325 MG PO TABS
650.0000 mg | ORAL_TABLET | Freq: Once | ORAL | Status: AC
  Administered 2021-03-18: 650 mg via ORAL

## 2021-03-18 MED ORDER — SODIUM CHLORIDE 0.9 % IV BOLUS
1000.0000 mL | Freq: Once | INTRAVENOUS | Status: AC
Start: 2021-03-18 — End: 2021-03-18
  Administered 2021-03-18: 1000 mL via INTRAVENOUS

## 2021-03-18 MED ORDER — OXYCODONE HCL 5 MG PO TABS
15.0000 mg | ORAL_TABLET | ORAL | Status: AC
  Administered 2021-03-18: 15 mg via ORAL
  Filled 2021-03-18: qty 3

## 2021-03-18 MED ORDER — METOCLOPRAMIDE HCL 5 MG/ML IJ SOLN
10.0000 mg | Freq: Once | INTRAMUSCULAR | Status: AC
  Administered 2021-03-18: 10 mg via INTRAVENOUS
  Filled 2021-03-18: qty 2

## 2021-03-18 MED ORDER — FAMOTIDINE 20 MG PO TABS: 20.0000 mg | ORAL_TABLET | Freq: Two times a day (BID) | ORAL | 0 refills | Status: DC

## 2021-03-18 MED ORDER — METOCLOPRAMIDE HCL 10 MG PO TABS: 10.0000 mg | ORAL_TABLET | Freq: Four times a day (QID) | ORAL | 0 refills | Status: DC | PRN

## 2021-03-18 MED ORDER — INSULIN ASPART 100 UNIT/ML IJ SOLN
6.0000 [IU] | Freq: Once | INTRAMUSCULAR | Status: AC
  Administered 2021-03-18: 6 [IU] via INTRAVENOUS
  Filled 2021-03-18: qty 1

## 2021-03-18 MED ORDER — ALUMINUM-MAGNESIUM-SIMETHICONE 200-200-20 MG/5ML PO SUSP: 30.0000 mL | Freq: Three times a day (TID) | ORAL | 0 refills | Status: DC

## 2021-03-18 MED ORDER — ALUM & MAG HYDROXIDE-SIMETH 200-200-20 MG/5ML PO SUSP
30.0000 mL | Freq: Once | ORAL | Status: AC
  Administered 2021-03-18: 30 mL via ORAL
  Filled 2021-03-18: qty 30

## 2021-03-18 NOTE — ED Provider Notes (Signed)
[facility] Emergency Department Provider Note  ____________________________________________  Time seen: Approximately 9:23 PM  I have reviewed the triage vital signs and the nursing notes.   HISTORY  Chief Complaint Chest Pain    HPI Caleb [name] is a 45 y.o. male with a history of diabetes GERD hypertension PTSD pulmonary embolism who comes ED complaining of central chest discomfort was brought 8 out of 10.  Took nitroglycerin and aspirin.  Associated with shortness of breath.  Not exertional, not pleuritic.  No cough or fever.  Notes that he forgot to take his Amaryl diabetes medicine this morning.   Patient also complains of generalized headache, chills, body aches, fatigue for the past 2 or 3 days.   Past Medical History:  Diagnosis Date  . Anxiety   . Diabetes mellitus without complication ([place])   . GERD (gastroesophageal reflux disease)   . History of degenerative disc disease   . Hypercholesteremia   . Hypertension   . Knee pain, bilateral   . Myocardial infarct ([place])   . PTSD (post-traumatic stress disorder)   . Pulmonary embolism ([place])   . Stroke [facility]) 2015   mold stroke per patient     Patient Active Problem List   Diagnosis Date Noted  . Alcohol dependence, binge pattern ([place]) 09/30/2019  . Coronary artery disease involving native coronary artery of native heart with unstable angina pectoris ([place]) 09/23/2019  . S/P drug eluting coronary stent placement 09/23/2019  . Steroid-induced hyperglycemia 09/23/2019  . Chronic, continuous use of opioids 09/22/2019  . Heavy alcohol use 09/22/2019  . Hyperglycemia 09/22/2019  . Memory difficulties 09/22/2019  . Psychophysiological insomnia 03/06/2019  . Leukocytosis 07/01/2018  . Chest pain 06/30/2018  . Hypertensive urgency 06/30/2018  . Hypokalemia 06/30/2018  . Fall 06/30/2018  . Anxiety   . Diabetes mellitus without complication ([place])   . GERD (gastroesophageal reflux  disease)   . Hypertension   . Hypercholesteremia   . Stroke-like symptom 10/16/2017  . Gastritis, erosive 05/21/2017  . 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 Vitamin D deficiency 03/18/2017  . LVH (left ventricular hypertrophy) due to hypertensive disease, without heart failure 01/29/2017  . Unstable angina ([place]) 01/24/2017  . Bee sting allergy 06/13/2016  . Chronic midline low back pain without sciatica 06/13/2016  . Kidney stone 06/12/2016  . History of colonic diverticulitis 03/07/2016  . Diverticulitis 02/26/2016  . Diverticulitis of large intestine without perforation or abscess without bleeding   . Hemiplegic migraine without status migrainosus, 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/21/2015  . OSA (obstructive sleep apnea) 06/21/2015  . Dental caries, unspecified 06/24/2014  . Stroke ([place]) 11/20/2013  . Alcoholic liver disease, unspecified ([place]) 05/21/2013  . Allergy to radiographic contrast media 05/21/2013  . Chronic liver disease 05/21/2013  . High risk medications (not anticoagulants) long-term use 02/27/2013  . BMI 32.0-32.9,adult 12/12/2012  . Morbid obesity ([place]) 12/12/2012  . Personal history of combat and operational stress reaction 02/22/2012  . Primary localized osteoarthrosis, lower leg 12/04/2011  . Health care maintenance 07/26/2010  . Tobacco use disorder 07/26/2010  . Asthma 03/24/2009  . History of pulmonary embolus (PE) 03/24/2009     Past Surgical History:  Procedure Laterality Date  . COLONOSCOPY WITH PROPOFOL N/A 05/07/2017   Procedure: COLONOSCOPY WITH PROPOFOL;  Surgeon: Lollie Sails, MD;  Location: [facility] ENDOSCOPY;  Service: Endoscopy;  Laterality: N/A;  . ESOPHAGOGASTRODUODENOSCOPY (EGD) WITH PROPOFOL N/A 05/07/2017   Procedure: ESOPHAGOGASTRODUODENOSCOPY (EGD) WITH PROPOFOL;  Surgeon: Lollie Sails, MD;  Location:  [facility] ENDOSCOPY;  Service: Endoscopy;  Laterality: N/A;  . HERNIA REPAIR    . Knee surgeries    . TONSILLECTOMY       Prior to  Admission medications   Medication Sig Start Date End Date Taking? Authorizing Provider  aluminum-magnesium hydroxide-simethicone (MAALOX) I037812 MG/5ML SUSP Take 30 mLs by mouth 4 (four) times daily -  before meals and at bedtime. 03/18/21  Yes Carrie Mew, MD  famotidine (PEPCID) 20 MG tablet Take 1 tablet (20 mg total) by mouth 2 (two) times daily. 03/18/21  Yes Carrie Mew, MD  metoCLOPramide (REGLAN) 10 MG tablet Take 1 tablet (10 mg total) by mouth every 6 (six) hours as needed. 03/18/21  Yes Carrie Mew, MD  albuterol (PROVENTIL HFA;VENTOLIN HFA) 108 (90 Base) MCG/ACT inhaler Inhale 2 puffs into the lungs every 6 (six) hours as needed for shortness of breath. 06/13/16   [provider]  aspirin EC 81 MG tablet Take 81 mg by mouth daily.    [provider]  aspirin-sod bicarb-citric acid (ALKA-SELTZER) 325 MG TBEF tablet Take 650 mg by mouth every 6 (six) hours as needed (indigestion).     [provider]  atorvastatin (LIPITOR) 80 MG tablet Take 80 mg by mouth at bedtime.  03/04/15   [provider]  b complex vitamins capsule Take 1 capsule by mouth daily.    [provider]  baclofen (LIORESAL) 10 MG tablet Take 2 tablets by mouth at bedtime.  Patient not taking: Reported on 07/12/2020    [provider]  carvedilol (COREG) 6.25 MG tablet Take 6.25 mg by mouth 2 (two) times daily. 06/13/20   [provider]  cetirizine (ZYRTEC) 10 MG tablet Take 10 mg by mouth daily.    [provider]  clopidogrel (PLAVIX) 75 MG tablet Take 75 mg by mouth daily. 06/14/20   [provider]  diazepam (VALIUM) 5 MG tablet Take 5 mg by mouth at bedtime.  01/14/15   [provider]  diclofenac sodium (VOLTAREN) 1 % GEL Apply 2 g topically daily as needed (pain).     [provider]  Diphenhyd-Hydrocort-Nystatin (FIRST-DUKES MOUTHWASH) SUSP Use as directed 5 mLs in the mouth or throat 3 (three) times  daily. Patient not taking: Reported on 07/12/2020 05/14/18   Carrie Mew, MD  diphenhydrAMINE (BENADRYL) 25 mg capsule Take 50 mg by mouth every 6 (six) hours as needed for itching or allergies.    [provider]  fluticasone (FLONASE) 50 MCG/ACT nasal spray Place 2 sprays into the nose daily.    [provider]  glimepiride (AMARYL) 2 MG tablet Take 2 mg by mouth daily. 06/13/20   [provider]  guaiFENesin (ROBITUSSIN) 100 MG/5ML SOLN Take 5 mLs (100 mg total) by mouth every 4 (four) hours as needed for cough or to loosen phlegm. Patient not taking: Reported on 07/12/2020 05/14/18   Carrie Mew, MD  hydrOXYzine (VISTARIL) 50 MG capsule Take 50 mg by mouth daily. 06/04/17   [provider]  liraglutide (VICTOZA) 18 MG/3ML SOPN Inject 1.2 mg into the skin daily. Patient not taking: Reported on 07/12/2020 06/06/18   [provider]  losartan-hydrochlorothiazide (HYZAAR) 100-25 MG tablet Take 0.5 tablets by mouth daily. 07/02/18   Domenic Polite, MD  Menthol, Topical Analgesic, (ICY HOT EX) Apply 1 application topically daily as needed (pain).    [provider]  metoprolol succinate (TOPROL-XL) 50 MG 24 hr tablet Take 50 mg by mouth daily. 12/24/17   [provider]  MONTELUKAST SODIUM PO Take 1 tablet by mouth daily. Patient not taking: Reported on 07/12/2020    [provider]  naproxen (NAPROSYN) 500 MG tablet Take 1 tablet (500 mg total) by mouth 2 (two) times daily with a meal. 04/22/17   Triplett, Cari B, FNP  oxyCODONE (ROXICODONE) 15 MG immediate release tablet Take 15 mg by mouth 5 (five) times daily. For pain. 09/25/15   [provider]  oxymetazoline (AFRIN) 0.05 % nasal spray Place 1 spray into both nostrils 2 (two) times daily as needed for congestion. Patient not taking: Reported on 07/12/2020    [provider]  Phenylephrine-Pheniramine-DM [facility] COLD & COUGH PO) Take 2 capsules by mouth  every 4 (four) hours as needed (Cough and cold). Patient not taking: Reported on 07/12/2020    [provider]  PROMETHAZINE HCL PO Take by mouth. Patient not taking: Reported on 07/12/2020    [provider]     Allergies Bee venom, Iodinated diagnostic agents, Metformin and related, and Lisinopril   Family History  Problem Relation Age of Onset  . Diabetes Mother   . Cancer Paternal Grandmother   . Cancer Paternal Grandfather   . Heart attack Maternal Grandfather 66    Social History Social History   Tobacco Use  . Smoking status: Current Every Day Smoker    Packs/day: 1.00    Years: 20.00    Pack years: 20.00    Types: Cigarettes  . Smokeless tobacco: Never Used  Vaping Use  . Vaping Use: Never used  Substance Use Topics  . Alcohol use: Yes    Alcohol/week: 4.0 standard drinks    Types: 4 Cans of beer per week    Comment: occas.   . Drug use: No    Review of Systems  Constitutional:   No fever or chills.  ENT:   No sore throat. No rhinorrhea. Cardiovascular:   Positive chest pain as above without syncope. Respiratory: Positive shortness of breath without cough. Gastrointestinal:   Negative for abdominal pain, vomiting and diarrhea.  Musculoskeletal:   Negative for focal pain or swelling All other systems reviewed and are negative except as documented above in ROS and HPI.  ____________________________________________   PHYSICAL EXAM:  VITAL SIGNS: ED Triage Vitals [03/18/21 1703]  Enc Vitals Group     BP (!) 168/88     Pulse Rate 90     Resp (!) 22     Temp 98 F (36.7 C)     Temp Source Oral     SpO2 95 %     Weight 245 lb (111.1 kg)     Height 6' (1.829 m)     Head Circumference      Peak Flow      Pain Score 7     Pain Loc      Pain Edu?      Excl. in [place]?     Vital signs reviewed, nursing assessments reviewed.   Constitutional:   Alert and oriented. Non-toxic appearance. Eyes:   Conjunctivae are normal. EOMI.  PERRL. ENT      Head:   Normocephalic and atraumatic.      Nose:   Wearing a mask.      Mouth/Throat:   Wearing a mask.      Neck:   No meningismus. Full ROM. Hematological/Lymphatic/Immunilogical:   No cervical lymphadenopathy. Cardiovascular:   RRR. Symmetric bilateral radial and DP pulses.  No murmurs. Cap refill less than 2 seconds.  Respiratory:   Normal respiratory effort without tachypnea/retractions. Breath sounds are clear and equal bilaterally. No wheezes/rales/rhonchi. Gastrointestinal:   Soft with mild left upper quadrant tenderness. Non distended. There is no CVA tenderness.  No rebound, rigidity, or guarding. Genitourinary:   deferred Musculoskeletal:   Normal range of motion in all extremities. No joint effusions.  No lower extremity tenderness.  No edema. Neurologic:   Normal speech and language.  Motor grossly intact. No acute focal neurologic deficits are appreciated.  Skin:    Skin is warm, dry and intact. No rash noted.  No petechiae, purpura, or bullae.  ____________________________________________    LABS (pertinent positives/negatives) (all labs ordered are listed, but only abnormal results are displayed) Labs Reviewed  BASIC METABOLIC PANEL - Abnormal; Notable for the following components:      Result Value   Sodium 133 (*)    Glucose, Bld 471 (*)    All other components within normal limits  CBC - Abnormal; Notable for the following components:   MCHC 36.3 (*)    All other components within normal limits  CBG MONITORING, ED - Abnormal; Notable for the following components:   Glucose-Capillary 480 (*)    All other components within normal limits  CBG MONITORING, ED - Abnormal; Notable for the following components:   Glucose-Capillary 240 (*)    All other components within normal limits  SARS CORONAVIRUS 2 (TAT 6-24 HRS)  POC SARS CORONAVIRUS 2 AG -  ED  TROPONIN I (HIGH SENSITIVITY)  TROPONIN I (HIGH SENSITIVITY)    ____________________________________________   EKG  Interpreted by me Normal sinus rhythm rate of 96, normal axis and intervals.  Normal QRS ST segments and T waves.  No acute ischemic changes, no evidence of right heart strain.  ____________________________________________    RADIOLOGY  DG Chest 2 View  Result Date: 03/18/2021 CLINICAL DATA:  Centralized chest pain radiating to left arm, short of breath, dizziness nausea EXAM: CHEST - 2 VIEW COMPARISON:  06/25/2020 FINDINGS: Frontal and lateral views of the chest demonstrate an unremarkable cardiac silhouette. No airspace disease, effusion, or pneumothorax. No acute bony abnormalities. IMPRESSION: 1. No acute intrathoracic process. Electronically Signed   By: Randa Ngo M.D.   On: 03/18/2021 17:35    ____________________________________________   PROCEDURES Procedures  ____________________________________________  DIFFERENTIAL DIAGNOSIS   Non-STEMI, GERD, pneumonia, pleural effusion, pneumothorax, viral illness  CLINICAL IMPRESSION / ASSESSMENT AND PLAN / ED COURSE  Medications ordered in the ED: Medications  acetaminophen (TYLENOL) 325 MG tablet (has no administration in time range)  acetaminophen (TYLENOL) tablet 650 mg (650 mg Oral Given 03/18/21 1845)  sodium chloride 0.9 % bolus 1,000 mL (1,000 mLs Intravenous New Bag/Given 03/18/21 2141)  insulin aspart (novoLOG) injection 6 Units (6 Units Intravenous Given 03/18/21 2131)  alum & mag hydroxide-simeth (MAALOX/MYLANTA) 200-200-20 MG/5ML suspension 30 mL (30 mLs Oral Given 03/18/21 2133)  metoCLOPramide (REGLAN) injection 10 mg (10 mg Intravenous Given 03/18/21 2136)  famotidine (PEPCID) tablet 40 mg (40 mg Oral Given 03/18/21 2133)  oxyCODONE (Oxy IR/ROXICODONE) immediate release tablet 15 mg (15 mg Oral Given 03/18/21 2223)    Pertinent labs & imaging results that were available during my care of the patient were reviewed by me and considered in my medical decision  making (see chart for details).  Caleb [name] was evaluated in Emergency Department on 03/18/2021 for the symptoms described in the history of present illness. He was evaluated in the context of the global COVID-19 pandemic, which necessitated consideration that  the patient might be at risk for infection with the SARS-CoV-2 virus that causes COVID-19. Institutional protocols and algorithms that pertain to the evaluation of patients at risk for COVID-19 are in a state of rapid change based on information released by regulatory bodies including the CDC and federal and state organizations. These policies and algorithms were followed during the patient's care in the ED.   Patient presents with atypical chest pain.  Vital signs are normal.  Considering the patient's symptoms, medical history, and physical examination today, I have low suspicion for ACS, PE, TAD, pneumothorax, carditis, mediastinitis, pneumonia, CHF, or sepsis.  Serial troponins are negative.  Labs are unremarkable without evidence of acidosis.  Doubt pancreatitis or biliary disease.  Will give IV fluids and insulin aspart for hyperglycemia.  Will give GI cocktail.  Will check COVID rapid antigen, if negative send PCR.   ----------------------------------------- 11:01 PM on 03/18/2021 -----------------------------------------  Feeling better, vitals normal.  Stable for discharge      ____________________________________________   FINAL CLINICAL IMPRESSION(S) / ED DIAGNOSES    Final diagnoses:  Atypical chest pain  Type 2 diabetes mellitus with hyperglycemia, without long-term current use of insulin [facility])     ED Discharge Orders         Ordered    famotidine (PEPCID) 20 MG tablet  2 times daily        03/18/21 2300    metoCLOPramide (REGLAN) 10 MG tablet  Every 6 hours PRN        03/18/21 2300    aluminum-magnesium hydroxide-simethicone (MAALOX) 200-200-20 MG/5ML SUSP  3 times daily before meals & bedtime         03/18/21 2300          Portions of this note were generated with dragon dictation software. Dictation errors may occur despite best attempts at proofreading.   Carrie Mew, MD 03/18/21 2302

## 2021-03-18 NOTE — ED Triage Notes (Signed)
Pt to ER via [facility] with complaints of centralized chest pain that radiates into left arm accompanied with shortness of breath. Pt also endorses dizziness and nausea. Reports hx of MI. Per patient, cbg was 500 with EMS. Pt states he missed his morning medication for his diabetes.

## 2021-03-18 NOTE — ED Notes (Signed)
Patient transported to X-ray 

## 2021-03-18 NOTE — ED Notes (Signed)
First Nurse Note: Pt to ED via [facility] for sudden on set chest pain 8/10. Pt has hx/o HTN, DM. Pt given Aspirin 324 mg and Nitro x 2. Pts initial BP was 186/92, BP after 2 Ntg 142/78. Pts CBG 571

## 2021-03-18 NOTE — ED Notes (Signed)
Pt informed screener that he was feeling lightheaded. RN over to speak with pt and informed him that we are waiting to get some open rooms but he should be one of the next few to go back. Pt reassured that last vital signs were ok. Pt offered a blanket while he waited and pt decline.

## 2021-03-19 LAB — SARS CORONAVIRUS 2 (TAT 6-24 HRS): SARS Coronavirus 2: NEGATIVE

## 2021-03-22 ENCOUNTER — Telehealth: Payer: Self-pay

## 2021-03-22 ENCOUNTER — Other Ambulatory Visit: Payer: Self-pay

## 2021-03-22 ENCOUNTER — Telehealth (INDEPENDENT_AMBULATORY_CARE_PROVIDER_SITE_OTHER): Payer: Self-pay | Admitting: Gastroenterology

## 2021-03-22 DIAGNOSIS — Z86010 Personal history of colonic polyps: Secondary | ICD-10-CM

## 2021-03-22 MED ORDER — NA SULFATE-K SULFATE-MG SULF 17.5-3.13-1.6 GM/177ML PO SOLN: 1.0000 | Freq: Once | ORAL | 0 refills | Status: AC

## 2021-03-22 NOTE — Telephone Encounter (Signed)
Pt stated during triage call that his blood thinner request  Could be sent to Dr. Fletcher Anon.  Message received from front office said to sent to [facility] per [place].  Contacted patient and he said to send to Dr. Heide Spark.  Blood thinner request will now be sent to Dr. Maudry Diego office via [place].  Thanks,  [place], [place]

## 2021-03-22 NOTE — Telephone Encounter (Signed)
Patient stated during his triage call that he has severe PTSD.  He also said he experienced nausea and itching after his last procedure. He wanted to make sure that you and anesthesia were aware of this, and they also had a hard time waking him up.  I told him I would note this for you and he should make sure he mentions it to the anesthesiologist and again to you the morning of the procedure.  Thanks,  [place], [place]

## 2021-03-22 NOTE — Progress Notes (Signed)
Gastroenterology Pre-Procedure Review  Request Date: 04/11/21 Requesting Physician: Dr. Bonna Gains  PATIENT REVIEW QUESTIONS: The patient responded to the following health history questions as indicated:    1. Are you having any GI issues? no 2. Do you have a personal history of Polyps? yes (05/07/17 performed by Dr.Skulskie) 3. Do you have a family history of Colon Cancer or Polyps? no 4. Diabetes Mellitus? yes (type 2) 5. Joint replacements in the past 12 months?no 6. Major health problems in the past 3 months?no 7. Any artificial heart valves, MVP, or defibrillator?no    MEDICATIONS & ALLERGIES:    Patient reports the following regarding taking any anticoagulation/antiplatelet therapy:   Plavix, Coumadin, Eliquis, Xarelto, Lovenox, Pradaxa, Brilinta, or Effient? Plavix prescribed by Dr. Fletcher Anon blood thinner request sent via epic Aspirin? yes (81 mg)  Patient confirms/reports the following medications:  Current Outpatient Medications  Medication Sig Dispense Refill  . albuterol (PROVENTIL HFA;VENTOLIN HFA) 108 (90 Base) MCG/ACT inhaler Inhale 2 puffs into the lungs every 6 (six) hours as needed for shortness of breath.    Marland Kitchen aluminum-magnesium hydroxide-simethicone (MAALOX) [phone] MG/5ML SUSP Take 30 mLs by mouth 4 (four) times daily -  before meals and at bedtime. 355 mL 0  . amLODipine (NORVASC) 5 MG tablet Take by mouth.    Marland Kitchen aspirin EC 81 MG tablet Take 81 mg by mouth daily.    Marland Kitchen atorvastatin (LIPITOR) 80 MG tablet Take 80 mg by mouth at bedtime.   3  . b complex vitamins capsule Take 1 capsule by mouth daily.    . carvedilol (COREG) 6.25 MG tablet Take 6.25 mg by mouth 2 (two) times daily.    . cetirizine (ZYRTEC) 10 MG tablet Take 10 mg by mouth daily.    . clopidogrel (PLAVIX) 75 MG tablet Take 75 mg by mouth daily.    . diazepam (VALIUM) 5 MG tablet Take 5 mg by mouth at bedtime.     . diphenhydrAMINE (BENADRYL) 25 mg capsule Take 50 mg by mouth every 6 (six) hours as  needed for itching or allergies.    Marland Kitchen EPINEPHrine 0.3 mg/0.3 mL IJ SOAJ injection Inject into the muscle.    . famotidine (PEPCID) 20 MG tablet Take 1 tablet (20 mg total) by mouth 2 (two) times daily. 60 tablet 0  . fluticasone (FLONASE) 50 MCG/ACT nasal spray Place 2 sprays into the nose daily.    Marland Kitchen glimepiride (AMARYL) 2 MG tablet Take 2 mg by mouth daily.    . hydrOXYzine (VISTARIL) 50 MG capsule Take 50 mg by mouth daily.    Marland Kitchen losartan (COZAAR) 100 MG tablet Take by mouth.    . losartan-hydrochlorothiazide (HYZAAR) 100-25 MG tablet Take 0.5 tablets by mouth daily.  11  . metoCLOPramide (REGLAN) 10 MG tablet Take 1 tablet (10 mg total) by mouth every 6 (six) hours as needed. 30 tablet 0  . metoprolol succinate (TOPROL-XL) 50 MG 24 hr tablet Take 50 mg by mouth daily.    . naproxen (NAPROSYN) 500 MG tablet Take 1 tablet (500 mg total) by mouth 2 (two) times daily with a meal. 30 tablet 0  . nitroGLYCERIN (NITROSTAT) 0.4 MG SL tablet Place under the tongue.    Marland Kitchen oxyCODONE (ROXICODONE) 15 MG immediate release tablet Take 15 mg by mouth 5 (five) times daily. For pain.  0  . polyethylene glycol powder (GLYCOLAX/MIRALAX) 17 GM/SCOOP powder Take by mouth.    Marland Kitchen aspirin-sod bicarb-citric acid (ALKA-SELTZER) 325 MG TBEF tablet Take 650 mg by  mouth every 6 (six) hours as needed (indigestion).  (Patient not taking: Reported on 03/22/2021)    . baclofen (LIORESAL) 10 MG tablet Take 2 tablets by mouth at bedtime.  (Patient not taking: No sig reported)    . diclofenac sodium (VOLTAREN) 1 % GEL Apply 2 g topically daily as needed (pain).  (Patient not taking: Reported on 03/22/2021)    . Diphenhyd-Hydrocort-Nystatin (FIRST-DUKES MOUTHWASH) SUSP Use as directed 5 mLs in the mouth or throat 3 (three) times daily. (Patient not taking: No sig reported) 1 Bottle 0  . guaiFENesin (ROBITUSSIN) 100 MG/5ML SOLN Take 5 mLs (100 mg total) by mouth every 4 (four) hours as needed for cough or to loosen phlegm. (Patient not  taking: No sig reported) 120 mL 0  . isosorbide mononitrate (IMDUR) 30 MG 24 hr tablet Take 30 mg by mouth 2 (two) times daily.    Marland Kitchen liraglutide (VICTOZA) 18 MG/3ML SOPN Inject 1.2 mg into the skin daily. (Patient not taking: No sig reported)    . Menthol, Topical Analgesic, (ICY HOT EX) Apply 1 application topically daily as needed (pain). (Patient not taking: Reported on 03/22/2021)    . metoprolol tartrate (LOPRESSOR) 25 MG tablet Take 25 mg by mouth 2 (two) times daily.    Marland Kitchen MONTELUKAST SODIUM PO Take 1 tablet by mouth daily. (Patient not taking: No sig reported)    . ondansetron (ZOFRAN-ODT) 4 MG disintegrating tablet 4 mg every 8 (eight) hours as needed.    Marland Kitchen oxymetazoline (AFRIN) 0.05 % nasal spray Place 1 spray into both nostrils 2 (two) times daily as needed for congestion. (Patient not taking: No sig reported)    . Phenylephrine-Pheniramine-DM (THERAFLU COLD & COUGH PO) Take 2 capsules by mouth every 4 (four) hours as needed (Cough and cold). (Patient not taking: No sig reported)    . potassium chloride (KLOR-CON) 10 MEQ tablet Take 10 mEq by mouth 2 (two) times daily.    Marland Kitchen PROMETHAZINE HCL PO Take by mouth. (Patient not taking: No sig reported)     No current facility-administered medications for this visit.    Patient confirms/reports the following allergies:  Allergies  Allergen Reactions  . Bee Venom Anaphylaxis  . Iodinated Diagnostic Agents Anaphylaxis and Swelling  . Metformin And Related Anaphylaxis    Unknown   . Lisinopril Swelling    No orders of the defined types were placed in this encounter.   AUTHORIZATION INFORMATION Primary Insurance: 1D#: Group #:  Secondary Insurance: 1D#: Group #:  SCHEDULE INFORMATION: Date: 04/11/21 Time: Location:[facility]

## 2021-03-22 NOTE — Telephone Encounter (Signed)
Sharyn Lull, can you please fax his cardiac clearance and blood thinner clearance to his cardiologist. Thank you.

## 2021-03-22 NOTE — Telephone Encounter (Signed)
[facility] called to tell us that the patient has never seen there providers. Will need need to send patient back to his cardiologist at [facility].

## 2021-03-29 ENCOUNTER — Telehealth: Payer: Self-pay

## 2021-03-29 NOTE — Telephone Encounter (Signed)
Received blood thinner clearance back from Dr. Heide Spark today and was informed that pt is no longer taking Plavix. Contacted pt to confirm this information and he did say he was not taking the Clopidogrel any longer. He was only on a daily Aspirin 81mg . See media for clearance information.

## 2021-04-11 ENCOUNTER — Encounter: Payer: Self-pay | Admitting: Gastroenterology

## 2021-04-11 ENCOUNTER — Other Ambulatory Visit: Payer: Self-pay

## 2021-04-11 ENCOUNTER — Ambulatory Visit: Payer: Medicare HMO | Admitting: Anesthesiology

## 2021-04-11 ENCOUNTER — Ambulatory Visit
Admission: RE | Admit: 2021-04-11 | Discharge: 2021-04-11 | Disposition: A | Discharge status: Home / Self Care | Payer: Medicare HMO | Attending: Gastroenterology | Admitting: Gastroenterology

## 2021-04-11 ENCOUNTER — Encounter
Admission: RE | Disposition: A | Discharge status: Home / Self Care | Payer: Self-pay | Source: Home / Self Care | Attending: Gastroenterology

## 2021-04-11 DIAGNOSIS — Z91041 Radiographic dye allergy status: Secondary | ICD-10-CM | POA: Diagnosis not present

## 2021-04-11 DIAGNOSIS — Z8249 Family history of ischemic heart disease and other diseases of the circulatory system: Secondary | ICD-10-CM | POA: Diagnosis not present

## 2021-04-11 DIAGNOSIS — K635 Polyp of colon: Secondary | ICD-10-CM

## 2021-04-11 DIAGNOSIS — Z8673 Personal history of transient ischemic attack (TIA), and cerebral infarction without residual deficits: Secondary | ICD-10-CM | POA: Insufficient documentation

## 2021-04-11 DIAGNOSIS — Z7902 Long term (current) use of antithrombotics/antiplatelets: Secondary | ICD-10-CM | POA: Insufficient documentation

## 2021-04-11 DIAGNOSIS — Z888 Allergy status to other drugs, medicaments and biological substances status: Secondary | ICD-10-CM | POA: Insufficient documentation

## 2021-04-11 DIAGNOSIS — Z7951 Long term (current) use of inhaled steroids: Secondary | ICD-10-CM | POA: Diagnosis not present

## 2021-04-11 DIAGNOSIS — Z91030 Bee allergy status: Secondary | ICD-10-CM | POA: Diagnosis not present

## 2021-04-11 DIAGNOSIS — K648 Other hemorrhoids: Secondary | ICD-10-CM | POA: Diagnosis not present

## 2021-04-11 DIAGNOSIS — D124 Benign neoplasm of descending colon: Secondary | ICD-10-CM | POA: Diagnosis not present

## 2021-04-11 DIAGNOSIS — Z86010 Personal history of colonic polyps: Secondary | ICD-10-CM | POA: Diagnosis not present

## 2021-04-11 DIAGNOSIS — E119 Type 2 diabetes mellitus without complications: Secondary | ICD-10-CM | POA: Insufficient documentation

## 2021-04-11 DIAGNOSIS — I10 Essential (primary) hypertension: Secondary | ICD-10-CM | POA: Insufficient documentation

## 2021-04-11 DIAGNOSIS — Z86711 Personal history of pulmonary embolism: Secondary | ICD-10-CM | POA: Insufficient documentation

## 2021-04-11 DIAGNOSIS — Z79899 Other long term (current) drug therapy: Secondary | ICD-10-CM | POA: Diagnosis not present

## 2021-04-11 DIAGNOSIS — F17210 Nicotine dependence, cigarettes, uncomplicated: Secondary | ICD-10-CM | POA: Insufficient documentation

## 2021-04-11 DIAGNOSIS — D125 Benign neoplasm of sigmoid colon: Secondary | ICD-10-CM | POA: Diagnosis not present

## 2021-04-11 DIAGNOSIS — K5730 Diverticulosis of large intestine without perforation or abscess without bleeding: Secondary | ICD-10-CM | POA: Insufficient documentation

## 2021-04-11 DIAGNOSIS — Z1211 Encounter for screening for malignant neoplasm of colon: Secondary | ICD-10-CM | POA: Diagnosis present

## 2021-04-11 DIAGNOSIS — Z833 Family history of diabetes mellitus: Secondary | ICD-10-CM | POA: Insufficient documentation

## 2021-04-11 HISTORY — PX: COLONOSCOPY WITH PROPOFOL: SHX5780

## 2021-04-11 LAB — GLUCOSE, CAPILLARY: Glucose-Capillary: 207 mg/dL — ABNORMAL HIGH (ref 70–99)

## 2021-04-11 SURGERY — COLONOSCOPY WITH PROPOFOL
Anesthesia: General

## 2021-04-11 MED ORDER — PROPOFOL 500 MG/50ML IV EMUL
INTRAVENOUS | Status: DC | PRN
  Administered 2021-04-11: 140 ug/kg/min via INTRAVENOUS

## 2021-04-11 MED ORDER — PROPOFOL 10 MG/ML IV BOLUS
INTRAVENOUS | Status: DC | PRN
  Administered 2021-04-11: 80 mg via INTRAVENOUS
  Administered 2021-04-11: 40 mg via INTRAVENOUS
  Administered 2021-04-11: 50 mg via INTRAVENOUS

## 2021-04-11 MED ORDER — SODIUM CHLORIDE 0.9 % IV SOLN: INTRAVENOUS | Status: DC

## 2021-04-11 MED ORDER — DEXMEDETOMIDINE HCL 200 MCG/2ML IV SOLN
INTRAVENOUS | Status: DC | PRN
  Administered 2021-04-11: 20 ug via INTRAVENOUS

## 2021-04-11 NOTE — Op Note (Signed)
[facility] Gastroenterology Patient Name: Caleb [name] Procedure Date: 04/11/2021 8:25 AM MRN: [id number] Account #: [id number] Date of Birth: [birth year] Admit Type: Outpatient Age: 45 Room: [facility] ENDO ROOM 2 Gender: Male Note Status: Finalized Procedure:             Colonoscopy Indications:           High risk colon cancer surveillance: Personal history                         of colonic polyps Providers:             Amrie Gurganus B. Bonna Gains MD, MD Medicines:             Monitored Anesthesia Care Complications:         No immediate complications. Procedure:             Pre-Anesthesia Assessment:                        - ASA Grade Assessment: II - A patient with mild                         systemic disease.                        - Prior to the procedure, a History and Physical was                         performed, and patient medications, allergies and                         sensitivities were reviewed. The patient's tolerance                         of previous anesthesia was reviewed.                        - The risks and benefits of the procedure and the                         sedation options and risks were discussed with the                         patient. All questions were answered and informed                         consent was obtained.                        - Patient identification and proposed procedure were                         verified prior to the procedure by the physician, the                         nurse, the anesthesiologist, the anesthetist and the                         technician. The procedure was verified in the  procedure room.                        After obtaining informed consent, the colonoscope was                         passed under direct vision. Throughout the procedure,                         the patient's blood pressure, pulse, and oxygen                         saturations were monitored  continuously. The                         Colonoscope was introduced through the anus and                         advanced to the the cecum, identified by appendiceal                         orifice and ileocecal valve. The colonoscopy was                         performed with ease. The patient tolerated the                         procedure well. The quality of the bowel preparation                         was fair except the rectum was good, the sigmoid colon                         was good and the descending colon was good. Findings:      The perianal and digital rectal examinations were normal.      A 10 mm polyp was found in the descending colon. The polyp was       semi-pedunculated. The polyp was removed with a hot snare. Resection and       retrieval were complete.      Two sessile polyps were found in the sigmoid colon. The polyps were 4 to       5 mm in size. These polyps were removed with a cold snare. Resection and       retrieval were complete.      A few inverted diverticula were found in the sigmoid colon.      The exam was otherwise without abnormality.      The rectum, sigmoid colon, descending colon, transverse colon, ascending       colon and cecum appeared normal.      Non-bleeding internal hemorrhoids were found during retroflexion.      No additional abnormalities were found on retroflexion. Impression:            - One 10 mm polyp in the descending colon, removed                         with a hot snare. Resected and retrieved.                        -  Two 4 to 5 mm polyps in the sigmoid colon, removed                         with a cold snare. Resected and retrieved.                        - Diverticulosis in the sigmoid colon.                        - The examination was otherwise normal.                        - The rectum, sigmoid colon, descending colon,                         transverse colon, ascending colon and cecum are normal.                        -  Non-bleeding internal hemorrhoids. Recommendation:        - Discharge patient to home (with escort).                        - Advance diet as tolerated.                        - Continue present medications.                        - Await pathology results.                        - Repeat colonoscopy in 1 year, with 2 day prep.                        - The findings and recommendations were discussed with                         the patient.                        - The findings and recommendations were discussed with                         the patient's family.                        - Return to primary care physician as previously                         scheduled.                        - High fiber diet. Procedure Code(s):     --- Professional ---                        [phone], Colonoscopy, flexible; with removal of                         tumor(s), polyp(s), or other lesion(s) by snare  technique Diagnosis Code(s):     --- Professional ---                        K63.5, Polyp of colon                        Z86.010, Personal history of colonic polyps CPT copyright 2019 [employer]. All rights reserved. The codes documented in this report are preliminary and upon coder review may  be revised to meet current compliance requirements.  Vonda Antigua, MD Margretta Sidle B. Bonna Gains MD, MD 04/11/2021 9:01:21 AM This report has been signed electronically. Number of Addenda: 0 Note Initiated On: 04/11/2021 8:25 AM Scope Withdrawal Time: 0 hours 15 minutes 33 seconds  Total Procedure Duration: 0 hours 25 minutes 37 seconds  Estimated Blood Loss:  Estimated blood loss: none.      [facility]

## 2021-04-11 NOTE — Transfer of Care (Signed)
Immediate Anesthesia Transfer of Care Note  Patient: Caleb [name]  Procedure(s) Performed: COLONOSCOPY WITH PROPOFOL (N/A )  Patient Location: PACU  Anesthesia Type:General  Level of Consciousness: sedated  Airway & Oxygen Therapy: Patient Spontanous Breathing and Patient connected to nasal cannula oxygen  Post-op Assessment: Report given to RN and Post -op Vital signs reviewed and stable  Post vital signs: Reviewed and stable  Last Vitals:  Vitals Value Taken Time  BP 120/70 04/11/21 0859  Temp 36.2 C 04/11/21 0859  Pulse 75 04/11/21 0901  Resp 20 04/11/21 0901  SpO2 95 % 04/11/21 0901  Vitals shown include unvalidated device data.  Last Pain:  Vitals:   04/11/21 0859  TempSrc: Tympanic  PainSc: Asleep         Complications: No complications documented.

## 2021-04-11 NOTE — Anesthesia Preprocedure Evaluation (Signed)
Anesthesia Evaluation  Patient identified by MRN, date of birth, ID band Patient awake    Reviewed: Allergy & Precautions, NPO status , reviewed documented beta blocker date and time Preop documentation limited or incomplete due to emergent nature of procedure.  History of Anesthesia Complications (+) PROLONGED EMERGENCE and history of anesthetic complications  Airway Mallampati: III  TM Distance: <3 FB Neck ROM: limited    Dental  (+) Poor Dentition, Chipped, Loose, Missing   Pulmonary asthma , sleep apnea , COPD, Current Smoker,     + decreased breath sounds      Cardiovascular hypertension, Pt. on medications and Pt. on home beta blockers + angina + CAD and + Past MI   Rhythm:Regular     Neuro/Psych  Headaches, PSYCHIATRIC DISORDERS Anxiety  Neuromuscular disease CVA    GI/Hepatic Neg liver ROS, PUD, GERD  Medicated and Controlled,  Endo/Other  negative endocrine ROSdiabetes  Renal/GU Renal disease     Musculoskeletal  (+) Arthritis ,   Abdominal   Peds  Hematology negative hematology ROS (+)   Anesthesia Other Findings Past Medical History: No date: Anxiety No date: Diabetes mellitus without complication (HCC) No date: GERD (gastroesophageal reflux disease) No date: History of degenerative disc disease No date: Hypercholesteremia No date: Hypertension No date: Knee pain, bilateral No date: Myocardial infarct (HCC) No date: PTSD (post-traumatic stress disorder) No date: Pulmonary embolism ([place]) 2015: Stroke ([place])     Comment:  mold stroke per patient   Reproductive/Obstetrics                             Anesthesia Physical  Anesthesia Plan  ASA: III  Anesthesia Plan: General   Post-op Pain Management:    Induction: Intravenous  PONV Risk Score and Plan: 0 and TIVA and Propofol infusion  Airway Management Planned: Natural Airway and Nasal Cannula  Additional  Equipment:   Intra-op Plan:   Post-operative Plan:   Informed Consent: I have reviewed the patients History and Physical, chart, labs and discussed the procedure including the risks, benefits and alternatives for the proposed anesthesia with the patient or authorized representative who has indicated his/her understanding and acceptance.     Dental Advisory Given  Plan Discussed with: CRNA  Anesthesia Plan Comments: (Patient consented for risks of anesthesia including but not limited to:  - adverse reactions to medications - risk of airway placement if required - damage to eyes, teeth, lips or other oral mucosa - nerve damage due to positioning  - sore throat or hoarseness - Damage to heart, brain, nerves, lungs, other parts of body or loss of life  Patient voiced understanding.)        Anesthesia Quick Evaluation

## 2021-04-11 NOTE — H&P (Signed)
Vonda Antigua, MD [address] [place], [place], [place], [address] [address], [place], [place], [place], [address] Phone: [phone]  Fax: [phone]  Primary Care Physician:  Venida Jarvis, MD   Pre-Procedure History & Physical: HPI:  Caleb [name] is a 45 y.o. male is here for a colonoscopy.   Past Medical History:  Diagnosis Date  . Anxiety   . Diabetes mellitus without complication ([place])   . GERD (gastroesophageal reflux disease)   . History of degenerative disc disease   . Hypercholesteremia   . Hypertension   . Knee pain, bilateral   . Myocardial infarct ([place])   . PTSD (post-traumatic stress disorder)   . Pulmonary embolism ([place])   . Stroke [facility]) 2015   mold stroke per patient    Past Surgical History:  Procedure Laterality Date  . COLONOSCOPY WITH PROPOFOL N/A 05/07/2017   Procedure: COLONOSCOPY WITH PROPOFOL;  Surgeon: Lollie Sails, MD;  Location: [facility] ENDOSCOPY;  Service: Endoscopy;  Laterality: N/A;  . ESOPHAGOGASTRODUODENOSCOPY (EGD) WITH PROPOFOL N/A 05/07/2017   Procedure: ESOPHAGOGASTRODUODENOSCOPY (EGD) WITH PROPOFOL;  Surgeon: Lollie Sails, MD;  Location: [facility] ENDOSCOPY;  Service: Endoscopy;  Laterality: N/A;  . HERNIA REPAIR    . Knee surgeries    . TONSILLECTOMY      Prior to Admission medications   Medication Sig Start Date End Date Taking? Authorizing Provider  albuterol (PROVENTIL HFA;VENTOLIN HFA) 108 (90 Base) MCG/ACT inhaler Inhale 2 puffs into the lungs every 6 (six) hours as needed for shortness of breath. 06/13/16  Yes [provider]  amLODipine (NORVASC) 5 MG tablet Take by mouth. 12/17/20 12/17/21 Yes [provider]  aspirin EC 81 MG tablet Take 81 mg by mouth daily.   Yes [provider]  atorvastatin (LIPITOR) 80 MG tablet Take 80 mg by mouth at bedtime.  03/04/15  Yes [provider]  carvedilol (COREG) 6.25 MG tablet Take 6.25 mg by mouth 2 (two) times daily. 06/13/20   Yes [provider]  cetirizine (ZYRTEC) 10 MG tablet Take 10 mg by mouth daily.   Yes [provider]  hydrOXYzine (VISTARIL) 50 MG capsule Take 50 mg by mouth daily. 06/04/17  Yes [provider]  isosorbide mononitrate (IMDUR) 30 MG 24 hr tablet Take 30 mg by mouth 2 (two) times daily. 01/19/21  Yes [provider]  losartan-hydrochlorothiazide (HYZAAR) 100-25 MG tablet Take 0.5 tablets by mouth daily. 07/02/18  Yes Domenic Polite, MD  aluminum-magnesium hydroxide-simethicone (MAALOX) 200-200-20 MG/5ML SUSP Take 30 mLs by mouth 4 (four) times daily -  before meals and at bedtime. 03/18/21   Carrie Mew, MD  aspirin-sod bicarb-citric acid (ALKA-SELTZER) 325 MG TBEF tablet Take 650 mg by mouth every 6 (six) hours as needed (indigestion).  Patient not taking: Reported on 03/22/2021    [provider]  b complex vitamins capsule Take 1 capsule by mouth daily.    [provider]  baclofen (LIORESAL) 10 MG tablet Take 2 tablets by mouth at bedtime.  Patient not taking: No sig reported    [provider]  clopidogrel (PLAVIX) 75 MG tablet Take 75 mg by mouth daily. Patient not taking: Reported on 04/11/2021 06/14/20   [provider]  diazepam (VALIUM) 5 MG tablet Take 5 mg by mouth at bedtime.  01/14/15   [provider]  diclofenac sodium (VOLTAREN) 1 % GEL Apply 2 g topically daily as needed (pain).  Patient not taking: Reported on 03/22/2021    [provider]  Diphenhyd-Hydrocort-Nystatin (FIRST-DUKES MOUTHWASH) SUSP Use as directed 5 mLs in the mouth or throat 3 (three) times daily. Patient not taking: No sig reported 05/14/18   Carrie Mew, MD  diphenhydrAMINE (BENADRYL) 25 mg capsule Take 50 mg by mouth every 6 (six) hours as needed for itching or allergies.    [provider]  EPINEPHrine 0.3 mg/0.3 mL IJ SOAJ injection Inject into the muscle. 09/24/19   [provider]  famotidine  (PEPCID) 20 MG tablet Take 1 tablet (20 mg total) by mouth 2 (two) times daily. 03/18/21   Carrie Mew, MD  fluticasone [facility]) 50 MCG/ACT nasal spray Place 2 sprays into the nose daily.    [provider]  glimepiride (AMARYL) 2 MG tablet Take 2 mg by mouth daily. 06/13/20   [provider]  guaiFENesin (ROBITUSSIN) 100 MG/5ML SOLN Take 5 mLs (100 mg total) by mouth every 4 (four) hours as needed for cough or to loosen phlegm. Patient not taking: No sig reported 05/14/18   Carrie Mew, MD  liraglutide (VICTOZA) 18 MG/3ML SOPN Inject 1.2 mg into the skin daily. Patient not taking: No sig reported 06/06/18   [provider]  losartan (COZAAR) 100 MG tablet Take by mouth. 10/21/20 10/21/21  [provider]  Menthol, Topical Analgesic, (ICY HOT EX) Apply 1 application topically daily as needed (pain). Patient not taking: Reported on 03/22/2021    [provider]  metoCLOPramide (REGLAN) 10 MG tablet Take 1 tablet (10 mg total) by mouth every 6 (six) hours as needed. 03/18/21   Carrie Mew, MD  metoprolol succinate (TOPROL-XL) 50 MG 24 hr tablet Take 50 mg by mouth daily. 12/24/17   [provider]  metoprolol tartrate (LOPRESSOR) 25 MG tablet Take 25 mg by mouth 2 (two) times daily. 01/19/21   [provider]  MONTELUKAST SODIUM PO Take 1 tablet by mouth daily. Patient not taking: No sig reported    [provider]  naproxen (NAPROSYN) 500 MG tablet Take 1 tablet (500 mg total) by mouth 2 (two) times daily with a meal. 04/22/17   Triplett, Cari B, FNP  nitroGLYCERIN (NITROSTAT) 0.4 MG SL tablet Place under the tongue. 10/19/20 10/19/21  [provider]  ondansetron (ZOFRAN-ODT) 4 MG disintegrating tablet 4 mg every 8 (eight) hours as needed. 12/23/20   [provider]  oxyCODONE (ROXICODONE) 15 MG immediate release tablet Take 15 mg by mouth 5 (five) times daily. For pain. 09/25/15   [provider]   oxymetazoline (AFRIN) 0.05 % nasal spray Place 1 spray into both nostrils 2 (two) times daily as needed for congestion. Patient not taking: No sig reported    [provider]  Phenylephrine-Pheniramine-DM [facility] COLD & COUGH PO) Take 2 capsules by mouth every 4 (four) hours as needed (Cough and cold). Patient not taking: No sig reported    [provider]  polyethylene glycol powder (GLYCOLAX/MIRALAX) 17 GM/SCOOP powder Take by mouth. 12/16/20   [provider]  potassium chloride (KLOR-CON) 10 MEQ tablet Take 10 mEq by mouth 2 (two) times daily. 12/23/20   [provider]  PROMETHAZINE HCL PO Take by mouth. Patient not taking: No sig reported    [provider]    Allergies as of 03/22/2021 - Review Complete 03/18/2021  Allergen Reaction Noted  . Bee venom Anaphylaxis 05/21/2013  . Iodinated diagnostic agents Anaphylaxis and Swelling 04/30/2015  . Metformin and related Anaphylaxis 04/30/2015  . Lisinopril Swelling 01/24/2017    Family History  Problem Relation Age of Onset  . Diabetes Mother   . Cancer Paternal Grandmother   . Cancer Paternal Grandfather   . Heart attack Maternal Grandfather 35    Social History   Socioeconomic History  . Marital status: Married    Spouse name: Not on file  . Number of children: Not on file  . Years of education: Not on file  . Highest education level: Not on file  Occupational History  . Not on file  Tobacco Use  . Smoking status: Current Every Day Smoker    Packs/day: 1.00    Years: 20.00    Pack years: 20.00    Types: Cigarettes  . Smokeless tobacco: Never Used  Vaping Use  . Vaping Use: Never used  Substance and Sexual Activity  . Alcohol use: Not Currently    Alcohol/week: 4.0 standard drinks    Types: 4 Cans of beer per week    Comment: occas.   . Drug use: No  . Sexual activity: Yes  Other Topics Concern  . Not on file  Social History Narrative  . Not on file   Social  Determinants of Health   Financial Resource Strain: Not on file  Food Insecurity: Not on file  Transportation Needs: Not on file  Physical Activity: Not on file  Stress: Not on file  Social Connections: Not on file  Intimate Partner Violence: Not on file    Review of Systems: See HPI, otherwise negative ROS  Physical Exam: BP (!) 157/87   Pulse 82   Temp 97.9 F (36.6 C) (Temporal)   Resp 18   Ht 6' (1.829 m)   Wt 111.1 kg   SpO2 99%   BMI 33.23 kg/m  General:   Alert,  pleasant and cooperative in NAD Head:  Normocephalic and atraumatic. Neck:  Supple; no masses or thyromegaly. Lungs:  Clear throughout to auscultation, normal respiratory effort.    Heart:  +S1, +S2, Regular rate and rhythm, No edema. Abdomen:  Soft, nontender and nondistended. Normal bowel sounds, without guarding, and without rebound.   Neurologic:  Alert and  oriented x4;  grossly normal neurologically.  Impression/Plan: Caleb [name] is here for a colonoscopy to be performed for history of polyps, adenoma polyps in 2018.  Risks, benefits, limitations, and alternatives regarding  colonoscopy have been reviewed with the patient.  Questions have been answered.  All parties agreeable.   Virgel Manifold, MD  04/11/2021, 8:08 AM

## 2021-04-11 NOTE — Anesthesia Postprocedure Evaluation (Signed)
Anesthesia Post Note  Patient: Caleb [name]  Procedure(s) Performed: COLONOSCOPY WITH PROPOFOL (N/A )  Patient location during evaluation: Endoscopy Anesthesia Type: General Level of consciousness: awake and alert Pain management: pain level controlled Vital Signs Assessment: post-procedure vital signs reviewed and stable Respiratory status: spontaneous breathing, nonlabored ventilation, respiratory function stable and patient connected to nasal cannula oxygen Cardiovascular status: blood pressure returned to baseline and stable Postop Assessment: no apparent nausea or vomiting Anesthetic complications: no   No complications documented.   Last Vitals:  Vitals:   04/11/21 0919 04/11/21 0929  BP: (!) 146/89 (!) 151/98  Pulse: 78 71  Resp: (!) 21 (!) 21  Temp:    SpO2: 96% 98%    Last Pain:  Vitals:   04/11/21 0929  TempSrc:   PainSc: 0-No pain                 Precious Haws [place]

## 2021-04-12 ENCOUNTER — Encounter: Payer: Self-pay | Admitting: Gastroenterology

## 2021-04-13 LAB — SURGICAL PATHOLOGY

## 2021-04-14 ENCOUNTER — Encounter: Payer: Self-pay | Admitting: Gastroenterology

## 2021-07-28 ENCOUNTER — Ambulatory Visit
Admission: EM | Admit: 2021-07-28 | Discharge: 2021-07-28 | Disposition: A | Discharge status: Home / Self Care | Payer: Medicare HMO | Attending: Student | Admitting: Student

## 2021-07-28 ENCOUNTER — Other Ambulatory Visit: Payer: Self-pay

## 2021-07-28 DIAGNOSIS — L03116 Cellulitis of left lower limb: Secondary | ICD-10-CM

## 2021-07-28 DIAGNOSIS — G6289 Other specified polyneuropathies: Secondary | ICD-10-CM

## 2021-07-28 DIAGNOSIS — E118 Type 2 diabetes mellitus with unspecified complications: Secondary | ICD-10-CM

## 2021-07-28 MED ORDER — SULFAMETHOXAZOLE-TRIMETHOPRIM 800-160 MG PO TABS: 1.0000 | ORAL_TABLET | Freq: Two times a day (BID) | ORAL | 0 refills | Status: DC

## 2021-07-28 NOTE — ED Triage Notes (Signed)
Pt here with C/O spot on foot that looks like it is getting infected. Pt has been keeping it clean has been putting neosporin on it. Doesn't seem to be getting any better. Pt doesn't know the name of medications he is on.
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
## 2021-07-28 NOTE — Discharge Instructions (Addendum)
-  Take antibiotics as prescribed. -Keep bandage over the area on your left foot. -Can continue to clean with gentle soaps.

## 2022-01-24 ENCOUNTER — Other Ambulatory Visit: Payer: Self-pay

## 2022-01-24 ENCOUNTER — Ambulatory Visit
Admission: EM | Admit: 2022-01-24 | Discharge: 2022-01-24 | Disposition: A | Discharge status: Home / Self Care | Payer: Medicare HMO

## 2022-01-24 DIAGNOSIS — F4310 Post-traumatic stress disorder, unspecified: Secondary | ICD-10-CM | POA: Insufficient documentation

## 2022-01-24 DIAGNOSIS — H01001 Unspecified blepharitis right upper eyelid: Secondary | ICD-10-CM

## 2022-01-24 MED ORDER — ERYTHROMYCIN 5 MG/GM OP OINT: TOPICAL_OINTMENT | OPHTHALMIC | 0 refills | Status: DC

## 2022-01-24 NOTE — Discharge Instructions (Signed)
Perform eyelid hygiene twice daily with baby shampoo worked into a Scientist, physiological. Scrub your eyelids 10 times and rinse thoroughly. ? ?Apply a thin, 1 inch ribbon of erythromycin to your inner eyelid margin at bedtime daily for 2 weeks. ?  ?

## 2022-01-24 NOTE — ED Triage Notes (Signed)
Patient is here for Right eye swelling & outside of it raw. No fever. No injury. First noticed "about 5 days ago".  ?

## 2022-01-24 NOTE — ED Provider Notes (Signed)
[facility]    CSN: [id number] Arrival date & time: 01/24/22  1438      History   Chief Complaint Chief Complaint  Patient presents with   Eye Problem    HPI Caleb [name] is a 46 y.o. male.   HPI  46 year old male here for evaluation of right eye complaint.  Patient reports that he has been experiencing swelling to his right upper eyelid for the last 5 days and he describes the eyelid as occasionally burning but feeling raw.  There is flaking to the skin of the upper eyelid and the eyelid is erythematous.  Patient also reports a whitish-yellow mucoid discharge with his eye matted shut in the mornings.  He also endorses some mild blurry vision.  He denies any injury.  Past Medical History:  Diagnosis Date   Anxiety    Diabetes mellitus without complication ([place])    GERD (gastroesophageal reflux disease)    History of degenerative disc disease    Hypercholesteremia    Hypertension    Knee pain, bilateral    Myocardial infarct [facility])    PTSD (post-traumatic stress disorder)    Pulmonary embolism ([place])    Stroke ([place]) 2015   mold stroke per patient    Patient Active Problem List   Diagnosis Date Noted   PTSD (post-traumatic stress disorder) 01/24/2022   History of colonic polyps    Polyp of colon    Diabetic polyneuropathy associated with type 2 diabetes mellitus ([place]) 03/03/2021   Chronic pain syndrome 12/16/2020   Alcohol dependence, binge pattern ([place]) 09/30/2019   Coronary artery disease involving native coronary artery of native heart with unstable angina pectoris ([place]) 09/23/2019   S/P drug eluting coronary stent placement 09/23/2019   Steroid-induced hyperglycemia 09/23/2019   Chronic, continuous use of opioids 09/22/2019   Heavy alcohol use 09/22/2019   Hyperglycemia 09/22/2019   Memory difficulties 09/22/2019   Psychophysiological insomnia 03/06/2019   Leukocytosis 07/01/2018   Chest pain 06/30/2018   Hypertensive urgency 06/30/2018    Hypokalemia 06/30/2018   Fall 06/30/2018   Anxiety    Hypercholesteremia    Stroke-like symptom 10/16/2017   Gastritis, erosive 05/21/2017   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   Type 2 diabetes mellitus with microalbuminuria, without long-term current use of insulin ([place]) 03/18/2017   Vitamin D deficiency 03/18/2017   LVH (left ventricular hypertrophy) due to hypertensive disease, without heart failure 01/29/2017   Unstable angina ([place]) 01/24/2017   Bee sting allergy 06/13/2016   Chronic midline low back pain without sciatica 06/13/2016   Kidney stone 06/12/2016   History of colonic diverticulitis 03/07/2016   Diverticulitis 02/26/2016   Diverticulitis of large intestine without perforation or abscess without bleeding    Hemiplegic migraine without status migrainosus, 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/21/2015   OSA (obstructive sleep apnea) 06/21/2015   Dental caries, unspecified 06/24/2014   Stroke ([place]) 66/29/4765   Alcoholic liver disease, unspecified ([place]) 05/21/2013   Allergy to radiographic contrast media 05/21/2013   Chronic liver disease 05/21/2013   High risk medications (not anticoagulants) long-term use 02/27/2013   BMI 32.0-32.9,adult 12/12/2012   Morbid obesity ([place]) 12/12/2012   Personal history of combat and operational stress reaction 02/22/2012   Primary localized osteoarthrosis, lower leg 12/04/2011   Health care maintenance 07/26/2010   Tobacco use disorder 07/26/2010   Asthma 03/24/2009   History of pulmonary embolus (PE) 03/24/2009   Essential hypertension 01/12/2009   Gastroesophageal reflux disease without esophagitis 06/22/2004    Past Surgical History:  Procedure Laterality Date   COLONOSCOPY WITH PROPOFOL N/A 05/07/2017   Procedure: COLONOSCOPY WITH PROPOFOL;  Surgeon: Lollie Sails, MD;  Location: [facility] ENDOSCOPY;  Service: Endoscopy;  Laterality: N/A;   COLONOSCOPY WITH PROPOFOL N/A 04/11/2021   Procedure: COLONOSCOPY WITH PROPOFOL;  Surgeon:  Virgel Manifold, MD;  Location: [facility] ENDOSCOPY;  Service: Endoscopy;  Laterality: N/A;   ESOPHAGOGASTRODUODENOSCOPY (EGD) WITH PROPOFOL N/A 05/07/2017   Procedure: ESOPHAGOGASTRODUODENOSCOPY (EGD) WITH PROPOFOL;  Surgeon: Lollie Sails, MD;  Location: [facility] ENDOSCOPY;  Service: Endoscopy;  Laterality: N/A;   HERNIA REPAIR     Knee surgeries     TONSILLECTOMY         Home Medications    Prior to Admission medications   Medication Sig Start Date End Date Taking? Authorizing Provider  erythromycin ophthalmic ointment Place a 1/2 inch ribbon of ointment onto the upper eyelid margin. 01/24/22  Yes Margarette Canada, NP  losartan-hydrochlorothiazide (HYZAAR) 100-25 MG tablet Take 1 tablet by mouth daily. 12/19/21 12/19/22 Yes [provider]  Accu-Chek Softclix Lancets lancets 1 each 3 (three) times daily. 01/17/22   [provider]  albuterol (PROVENTIL HFA;VENTOLIN HFA) 108 (90 Base) MCG/ACT inhaler Inhale 2 puffs into the lungs every 6 (six) hours as needed for shortness of breath. 06/13/16   [provider]  aluminum-magnesium hydroxide-simethicone (MAALOX) 027-741-28 MG/5ML SUSP Take 30 mLs by mouth 4 (four) times daily -  before meals and at bedtime. 03/18/21   Carrie Mew, MD  amLODipine (NORVASC) 5 MG tablet Take by mouth. 12/17/20 12/17/21  [provider]  aspirin EC 81 MG tablet Take 81 mg by mouth daily.    [provider]  aspirin-sod bicarb-citric acid (ALKA-SELTZER) 325 MG TBEF tablet Take 650 mg by mouth every 6 (six) hours as needed (indigestion).  Patient not taking: Reported on 03/22/2021    [provider]  atorvastatin (LIPITOR) 80 MG tablet Take by mouth. 06/05/21 06/05/22  [provider]  b complex vitamins capsule Take 1 capsule by mouth daily.    [provider]  baclofen (LIORESAL) 10 MG tablet Take 2 tablets by mouth at bedtime.  Patient not taking: No sig reported    [provider]  Blood  Glucose Monitoring Suppl (ACCU-CHEK GUIDE ME) w/Device KIT See admin instructions. 01/17/22   [provider]  Blood Glucose Monitoring Suppl (FIFTY50 GLUCOSE METER 2.0) w/Device KIT Check once daily 10/28/21 10/28/22  [provider]  carvedilol (COREG) 6.25 MG tablet Take 6.25 mg by mouth 2 (two) times daily. 06/13/20   [provider]  cetirizine (ZYRTEC) 10 MG tablet Take 10 mg by mouth daily.    [provider]  clopidogrel (PLAVIX) 75 MG tablet Take 75 mg by mouth daily. Patient not taking: Reported on 04/11/2021 06/14/20   [provider]  Cysteamine Bitartrate (PROCYSBI) 300 MG PACK Use 1 each 3 (three) times daily 01/17/22 01/17/23  [provider]  diazepam (VALIUM) 5 MG tablet Take 5 mg by mouth at bedtime.  01/14/15   [provider]  diazepam (VALIUM) 5 MG tablet Take by mouth. 09/02/21   [provider]  diclofenac sodium (VOLTAREN) 1 % GEL Apply 2 g topically daily as needed (pain).  Patient not taking: Reported on 03/22/2021    [provider]  Diphenhyd-Hydrocort-Nystatin (FIRST-DUKES MOUTHWASH) SUSP Use as directed 5 mLs in the mouth or throat 3 (three) times daily. Patient not taking: No sig reported 05/14/18   Carrie Mew, MD  diphenhydrAMINE (BENADRYL) 25  mg capsule Take 50 mg by mouth every 6 (six) hours as needed for itching or allergies.    [provider]  EPINEPHrine 0.3 mg/0.3 mL IJ SOAJ injection Inject into the muscle. 09/24/19   [provider]  esomeprazole (NEXIUM) 20 MG capsule Take by mouth.    [provider]  ezetimibe (ZETIA) 10 MG tablet Take 1 tablet by mouth daily. 01/18/22 01/18/23  [provider]  famotidine (PEPCID) 20 MG tablet Take 1 tablet (20 mg total) by mouth 2 (two) times daily. 03/18/21   Carrie Mew, MD  fluticasone [facility]) 50 MCG/ACT nasal spray Place into the nose.    [provider]  glimepiride (AMARYL) 2 MG tablet Take  by mouth. 01/17/22   [provider]  glimepiride (AMARYL) 4 MG tablet Take by mouth. 12/19/21 03/19/22  [provider]  glucose blood (FREESTYLE INSULINX TEST) test strip daily. 04/25/21   [provider]  guaiFENesin (ROBITUSSIN) 100 MG/5ML SOLN Take 5 mLs (100 mg total) by mouth every 4 (four) hours as needed for cough or to loosen phlegm. Patient not taking: No sig reported 05/14/18   Carrie Mew, MD  hydrOXYzine (VISTARIL) 50 MG capsule Take 50 mg by mouth daily. 06/04/17   [provider]  isosorbide mononitrate (IMDUR) 30 MG 24 hr tablet Take by mouth. 12/19/21 12/19/22  [provider]  isosorbide mononitrate (IMDUR) 60 MG 24 hr tablet Take 60 mg by mouth daily. 01/17/22   [provider]  Lancets (FREESTYLE) lancets daily. 04/25/21   [provider]  liraglutide (VICTOZA) 18 MG/3ML SOPN Inject 1.2 mg into the skin daily. Patient not taking: No sig reported 06/06/18   [provider]  losartan (COZAAR) 100 MG tablet Take by mouth. 01/18/22 01/18/23  [provider]  Menthol, Topical Analgesic, (ICY HOT EX) Apply 1 application topically daily as needed (pain). Patient not taking: Reported on 03/22/2021    [provider]  metoCLOPramide (REGLAN) 10 MG tablet Take 1 tablet (10 mg total) by mouth every 6 (six) hours as needed. 03/18/21   Carrie Mew, MD  metoprolol succinate (TOPROL-XL) 50 MG 24 hr tablet Take 50 mg by mouth daily. 12/24/17   [provider]  metoprolol tartrate (LOPRESSOR) 25 MG tablet Take 25 mg by mouth 2 (two) times daily. 01/19/21   [provider]  MONTELUKAST SODIUM PO Take 1 tablet by mouth daily. Patient not taking: No sig reported    [provider]  naproxen (NAPROSYN) 500 MG tablet Take 1 tablet (500 mg total) by mouth 2 (two) times daily with a meal. 04/22/17   Triplett, Cari B, FNP  nicotine (NICODERM CQ - DOSED IN MG/24 HOURS) 21 mg/24hr patch Place onto  the skin. 01/16/22 04/10/22  [provider]  nortriptyline (PAMELOR) 25 MG capsule Take 25 mg by mouth at bedtime. 10/25/21   [provider]  ondansetron (ZOFRAN-ODT) 4 MG disintegrating tablet 4 mg every 8 (eight) hours as needed. 12/23/20   [provider]  oxyCODONE (ROXICODONE) 15 MG immediate release tablet Take by mouth. 12/19/21 03/19/22  [provider]  oxymetazoline (AFRIN) 0.05 % nasal spray Place 1 spray into both nostrils 2 (two) times daily as needed for congestion. Patient not taking: Reported on 07/12/2020    [provider]  polyethylene glycol powder (GLYCOLAX/MIRALAX) 17 GM/SCOOP powder Take by mouth. 12/16/20   [provider]  potassium chloride (KLOR-CON) 10 MEQ tablet Take 10 mEq by mouth 2 (two) times daily. 12/23/20  [provider]  PROMETHAZINE HCL PO Take by mouth. Patient not taking: No sig reported    [provider]  ranolazine (RANEXA) 500 MG 12 hr tablet Take 1 tablet by mouth 2 (two) times daily. 01/17/22 01/17/23  [provider]  Semaglutide,0.25 or 0.5MG/DOS, 2 MG/1.5ML SOPN Inject into the skin. 12/19/21 02/13/22  [provider]  sulfamethoxazole-trimethoprim (BACTRIM DS) 800-160 MG tablet Take 1 tablet by mouth 2 (two) times daily. 07/28/21   Lattie Corns, PA-C    Family History Family History  Problem Relation Age of Onset   Diabetes Mother    Cancer Paternal Grandmother    Cancer Paternal Grandfather    Heart attack Maternal Grandfather 95    Social History Social History   Tobacco Use   Smoking status: Every Day    Packs/day: 1.00    Years: 20.00    Pack years: 20.00    Types: Cigarettes   Smokeless tobacco: Never  Vaping Use   Vaping Use: Never used  Substance Use Topics   Alcohol use: Not Currently    Alcohol/week: 4.0 standard drinks    Types: 4 Cans of beer per week    Comment: occas.    Drug use: No     Allergies   Bee venom, Iodinated  contrast media, Metformin and related, and Lisinopril   Review of Systems Review of Systems  Eyes:  Positive for pain, discharge, redness, itching and visual disturbance. Negative for photophobia.    Physical Exam Triage Vital Signs ED Triage Vitals  Enc Vitals Group     BP 01/24/22 1538 119/70     Pulse Rate 01/24/22 1538 82     Resp 01/24/22 1538 20     Temp 01/24/22 1538 98.4 F (36.9 C)     Temp Source 01/24/22 1538 Oral     SpO2 01/24/22 1538 97 %     Weight 01/24/22 1536 240 lb (108.9 kg)     Height 01/24/22 1536 6' (1.829 m)     Head Circumference --      Peak Flow --      Pain Score 01/24/22 1536 0     Pain Loc --      Pain Edu? --      Excl. in [place]? --    No data found.  Updated Vital Signs BP 119/70 (BP Location: Left Arm)    Pulse 82    Temp 98.4 F (36.9 C) (Oral)    Resp 20    Ht 6' (1.829 m)    Wt 240 lb (108.9 kg)    SpO2 97%    BMI 32.55 kg/m   Visual Acuity Right Eye Distance: 20/40 Uncorrected Left Eye Distance: 20/30 Uncorrected Bilateral Distance: 20/30 Uncorrected  Right Eye Near:   Left Eye Near:    Bilateral Near:     Physical Exam Vitals and nursing note reviewed.  Constitutional:      Appearance: Normal appearance. He is not ill-appearing.  HENT:     Head: Normocephalic and atraumatic.  Eyes:     General:        Right eye: Discharge present.     Extraocular Movements: Extraocular movements intact.     Conjunctiva/sclera: Conjunctivae normal.     Pupils: Pupils are equal, round, and reactive to light.  Skin:    General: Skin is warm and dry.     Capillary Refill: Capillary refill takes less than 2 seconds.     Findings: No erythema or rash.  Neurological:     General: No focal deficit present.     Mental Status: He is alert and oriented to person, place, and time.  Psychiatric:        Mood and Affect: Mood normal.        Behavior: Behavior normal.        Thought Content: Thought content normal.        Judgment: Judgment normal.      UC Treatments / Results  Labs (all labs ordered are listed, but only abnormal results are displayed) Labs Reviewed - No data to display  EKG   Radiology No results found.  Procedures Procedures (including critical care time)  Medications Ordered in UC Medications - No data to display  Initial Impression / Assessment and Plan / UC Course  I have reviewed the triage vital signs and the nursing notes.  Pertinent labs & imaging results that were available during my care of the patient were reviewed by me and considered in my medical decision making (see chart for details).  Patient is a pleasant, nontoxic-appearing 46 year old male here for evaluation of redness and swelling to the right upper eyelid as outlined in HPI above.  On exam patient has scaly skin on the upper eyelid and is erythematous, tender, and firm.  The eyelid is too edematous to invert but I am able to see white dots along the eyelid margin that suggest plugged meibomian glands.  Patient's EOM is intact and his pupils equal round reactive.  Bulbar and labral conjunctiva appear normal in appearance.  I will treat the patient with erythromycin eye ointment and vigorous eyelid hygiene for the next 2 weeks.  I have advised him that if his symptoms do not improve, or they worsen, he needs to follow-up with ophthalmology.   Final Clinical Impressions(s) / UC Diagnoses   Final diagnoses:  Blepharitis of right upper eyelid, unspecified type     Discharge Instructions      Perform eyelid hygiene twice daily with baby shampoo worked into a Scientist, physiological. Scrub your eyelids 10 times and rinse thoroughly.  Apply a thin, 1 inch ribbon of erythromycin to your inner eyelid margin at bedtime daily for 2 weeks.       ED Prescriptions     Medication Sig Dispense Auth. Provider   erythromycin ophthalmic ointment Place a 1/2 inch ribbon of ointment onto the upper eyelid margin. 3.5 g Margarette Canada, NP      PDMP not  reviewed this encounter.   Margarette Canada, NP 01/24/22 [id number]

## 2022-02-09 ENCOUNTER — Emergency Department: Payer: Medicare HMO

## 2022-02-09 ENCOUNTER — Observation Stay
Admission: EM | Admit: 2022-02-09 | Discharge: 2022-02-10 | Disposition: A | Discharge status: Home / Self Care | Payer: Medicare HMO | Attending: Internal Medicine | Admitting: Internal Medicine

## 2022-02-09 ENCOUNTER — Other Ambulatory Visit: Payer: Self-pay

## 2022-02-09 DIAGNOSIS — K219 Gastro-esophageal reflux disease without esophagitis: Secondary | ICD-10-CM | POA: Diagnosis present

## 2022-02-09 DIAGNOSIS — F17210 Nicotine dependence, cigarettes, uncomplicated: Secondary | ICD-10-CM | POA: Diagnosis not present

## 2022-02-09 DIAGNOSIS — Z7984 Long term (current) use of oral hypoglycemic drugs: Secondary | ICD-10-CM | POA: Diagnosis not present

## 2022-02-09 DIAGNOSIS — I2699 Other pulmonary embolism without acute cor pulmonale: Secondary | ICD-10-CM | POA: Diagnosis not present

## 2022-02-09 DIAGNOSIS — Z7902 Long term (current) use of antithrombotics/antiplatelets: Secondary | ICD-10-CM | POA: Insufficient documentation

## 2022-02-09 DIAGNOSIS — I25119 Atherosclerotic heart disease of native coronary artery with unspecified angina pectoris: Secondary | ICD-10-CM | POA: Diagnosis not present

## 2022-02-09 DIAGNOSIS — I2510 Atherosclerotic heart disease of native coronary artery without angina pectoris: Secondary | ICD-10-CM | POA: Insufficient documentation

## 2022-02-09 DIAGNOSIS — R079 Chest pain, unspecified: Secondary | ICD-10-CM | POA: Diagnosis not present

## 2022-02-09 DIAGNOSIS — E1169 Type 2 diabetes mellitus with other specified complication: Secondary | ICD-10-CM

## 2022-02-09 DIAGNOSIS — Z79899 Other long term (current) drug therapy: Secondary | ICD-10-CM | POA: Diagnosis not present

## 2022-02-09 DIAGNOSIS — G4733 Obstructive sleep apnea (adult) (pediatric): Secondary | ICD-10-CM | POA: Diagnosis present

## 2022-02-09 DIAGNOSIS — I10 Essential (primary) hypertension: Secondary | ICD-10-CM | POA: Diagnosis not present

## 2022-02-09 DIAGNOSIS — Z8673 Personal history of transient ischemic attack (TIA), and cerebral infarction without residual deficits: Secondary | ICD-10-CM | POA: Insufficient documentation

## 2022-02-09 DIAGNOSIS — R0789 Other chest pain: Secondary | ICD-10-CM | POA: Diagnosis present

## 2022-02-09 DIAGNOSIS — Z7982 Long term (current) use of aspirin: Secondary | ICD-10-CM | POA: Diagnosis not present

## 2022-02-09 DIAGNOSIS — F1011 Alcohol abuse, in remission: Secondary | ICD-10-CM | POA: Diagnosis present

## 2022-02-09 DIAGNOSIS — E1165 Type 2 diabetes mellitus with hyperglycemia: Secondary | ICD-10-CM | POA: Diagnosis not present

## 2022-02-09 LAB — CBC
HCT: 46.4 % (ref 39.0–52.0)
Hemoglobin: 16.2 g/dL (ref 13.0–17.0)
MCH: 31 pg (ref 26.0–34.0)
MCHC: 34.9 g/dL (ref 30.0–36.0)
MCV: 88.7 fL (ref 80.0–100.0)
Platelets: 245 10*3/uL (ref 150–400)
RBC: 5.23 MIL/uL (ref 4.22–5.81)
RDW: 12.8 % (ref 11.5–15.5)
WBC: 6.6 10*3/uL (ref 4.0–10.5)
nRBC: 0 % (ref 0.0–0.2)

## 2022-02-09 LAB — BASIC METABOLIC PANEL
Anion gap: 12 (ref 5–15)
BUN: 10 mg/dL (ref 6–20)
CO2: 23 mmol/L (ref 22–32)
Calcium: 8.8 mg/dL — ABNORMAL LOW (ref 8.9–10.3)
Chloride: 105 mmol/L (ref 98–111)
Creatinine, Ser: 0.76 mg/dL (ref 0.61–1.24)
GFR, Estimated: >60 mL/min (ref >60)
Glucose, Bld: 304 mg/dL — ABNORMAL HIGH (ref 70–99)
Potassium: 3.2 mmol/L — ABNORMAL LOW (ref 3.5–5.1)
Sodium: 140 mmol/L (ref 135–145)

## 2022-02-09 LAB — TROPONIN I (HIGH SENSITIVITY)
Troponin I (High Sensitivity): 5 ng/L (ref <18)
Troponin I (High Sensitivity): 5 ng/L (ref <18)

## 2022-02-09 LAB — CBG MONITORING, ED: Glucose-Capillary: 321 mg/dL — ABNORMAL HIGH (ref 70–99)

## 2022-02-09 LAB — D-DIMER, QUANTITATIVE: D-Dimer, Quant: 0.32 ug/mL-FEU (ref 0.00–0.50)

## 2022-02-09 LAB — GLUCOSE, CAPILLARY: Glucose-Capillary: 308 mg/dL — ABNORMAL HIGH (ref 70–99)

## 2022-02-09 MED ORDER — PANTOPRAZOLE SODIUM 40 MG PO TBEC
40.0000 mg | DELAYED_RELEASE_TABLET | Freq: Every day | ORAL | Status: DC
  Administered 2022-02-10: 40 mg via ORAL
  Filled 2022-02-09: qty 1

## 2022-02-09 MED ORDER — OXYCODONE HCL 5 MG PO TABS
15.0000 mg | ORAL_TABLET | ORAL | Status: DC | PRN
  Administered 2022-02-09 – 2022-02-10 (×3): 15 mg via ORAL
  Filled 2022-02-09 (×3): qty 3

## 2022-02-09 MED ORDER — ONDANSETRON HCL 4 MG/2ML IJ SOLN
4.0000 mg | Freq: Four times a day (QID) | INTRAMUSCULAR | Status: DC | PRN
  Administered 2022-02-09: 4 mg via INTRAVENOUS
  Filled 2022-02-09: qty 2

## 2022-02-09 MED ORDER — POTASSIUM CHLORIDE CRYS ER 20 MEQ PO TBCR
40.0000 meq | EXTENDED_RELEASE_TABLET | Freq: Once | ORAL | Status: AC
  Administered 2022-02-09: 40 meq via ORAL
  Filled 2022-02-09: qty 2

## 2022-02-09 MED ORDER — METOPROLOL TARTRATE 25 MG PO TABS: 25.0000 mg | ORAL_TABLET | Freq: Two times a day (BID) | ORAL | Status: DC

## 2022-02-09 MED ORDER — ALBUTEROL SULFATE (2.5 MG/3ML) 0.083% IN NEBU: 3.0000 mL | INHALATION_SOLUTION | Freq: Four times a day (QID) | RESPIRATORY_TRACT | Status: DC | PRN

## 2022-02-09 MED ORDER — METOPROLOL SUCCINATE ER 50 MG PO TB24
50.0000 mg | ORAL_TABLET | Freq: Every day | ORAL | Status: DC
  Administered 2022-02-09: 50 mg via ORAL
  Filled 2022-02-09: qty 1

## 2022-02-09 MED ORDER — NITROGLYCERIN 0.4 MG SL SUBL
0.4000 mg | SUBLINGUAL_TABLET | Freq: Once | SUBLINGUAL | Status: AC
  Administered 2022-02-09: 0.4 mg via SUBLINGUAL
  Filled 2022-02-09 (×2): qty 1

## 2022-02-09 MED ORDER — INSULIN ASPART 100 UNIT/ML IJ SOLN
0.0000 [IU] | Freq: Three times a day (TID) | INTRAMUSCULAR | Status: DC
  Administered 2022-02-09: 11 [IU] via SUBCUTANEOUS
  Administered 2022-02-10: 8 [IU] via SUBCUTANEOUS
  Administered 2022-02-10: 5 [IU] via SUBCUTANEOUS
  Filled 2022-02-09 (×3): qty 1

## 2022-02-09 MED ORDER — ONDANSETRON HCL 4 MG PO TABS: 4.0000 mg | ORAL_TABLET | Freq: Four times a day (QID) | ORAL | Status: DC | PRN

## 2022-02-09 MED ORDER — ENOXAPARIN SODIUM 60 MG/0.6ML IJ SOSY
55.0000 mg | PREFILLED_SYRINGE | Freq: Every day | INTRAMUSCULAR | Status: DC
  Administered 2022-02-09: 55 mg via SUBCUTANEOUS
  Filled 2022-02-09: qty 0.6

## 2022-02-09 MED ORDER — ACETAMINOPHEN 325 MG PO TABS: 650.0000 mg | ORAL_TABLET | Freq: Four times a day (QID) | ORAL | Status: DC | PRN

## 2022-02-09 MED ORDER — ATORVASTATIN CALCIUM 20 MG PO TABS
80.0000 mg | ORAL_TABLET | Freq: Every day | ORAL | Status: DC
  Administered 2022-02-10: 80 mg via ORAL
  Filled 2022-02-09: qty 4

## 2022-02-09 MED ORDER — POLYETHYLENE GLYCOL 3350 17 G PO PACK: 17.0000 g | PACK | Freq: Every day | ORAL | Status: DC | PRN

## 2022-02-09 MED ORDER — DIPHENHYDRAMINE HCL 25 MG PO CAPS: 50.0000 mg | ORAL_CAPSULE | Freq: Four times a day (QID) | ORAL | Status: DC | PRN

## 2022-02-09 MED ORDER — NITROGLYCERIN 0.4 MG SL SUBL
0.4000 mg | SUBLINGUAL_TABLET | SUBLINGUAL | Status: DC | PRN
Start: 2022-02-09 — End: 2022-02-10

## 2022-02-09 MED ORDER — NORTRIPTYLINE HCL 25 MG PO CAPS
25.0000 mg | ORAL_CAPSULE | Freq: Every day | ORAL | Status: DC
  Administered 2022-02-09: 25 mg via ORAL
  Filled 2022-02-09: qty 1

## 2022-02-09 MED ORDER — LORAZEPAM 2 MG/ML IJ SOLN
1.0000 mg | Freq: Once | INTRAMUSCULAR | Status: AC
  Administered 2022-02-09: 1 mg via INTRAVENOUS
  Filled 2022-02-09: qty 1

## 2022-02-09 MED ORDER — EZETIMIBE 10 MG PO TABS
10.0000 mg | ORAL_TABLET | Freq: Every day | ORAL | Status: DC
  Administered 2022-02-10: 10 mg via ORAL
  Filled 2022-02-09: qty 1

## 2022-02-09 MED ORDER — ASPIRIN EC 81 MG PO TBEC
81.0000 mg | DELAYED_RELEASE_TABLET | Freq: Every day | ORAL | Status: DC
  Administered 2022-02-10: 81 mg via ORAL
  Filled 2022-02-09: qty 1

## 2022-02-09 MED ORDER — LORATADINE 10 MG PO TABS
10.0000 mg | ORAL_TABLET | Freq: Every day | ORAL | Status: DC
  Administered 2022-02-10: 10 mg via ORAL
  Filled 2022-02-09: qty 1

## 2022-02-09 MED ORDER — ACETAMINOPHEN 650 MG RE SUPP: 650.0000 mg | Freq: Four times a day (QID) | RECTAL | Status: DC | PRN

## 2022-02-09 MED ORDER — LOSARTAN POTASSIUM 50 MG PO TABS
100.0000 mg | ORAL_TABLET | Freq: Every day | ORAL | Status: DC
  Administered 2022-02-10: 100 mg via ORAL
  Filled 2022-02-09: qty 2

## 2022-02-09 MED ORDER — FAMOTIDINE 20 MG PO TABS
20.0000 mg | ORAL_TABLET | Freq: Two times a day (BID) | ORAL | Status: DC
  Administered 2022-02-09 – 2022-02-10 (×2): 20 mg via ORAL
  Filled 2022-02-09 (×2): qty 1

## 2022-02-09 MED ORDER — MORPHINE SULFATE (PF) 4 MG/ML IV SOLN
4.0000 mg | Freq: Once | INTRAVENOUS | Status: AC
Start: 2022-02-09 — End: 2022-02-09
  Administered 2022-02-09: 4 mg via INTRAVENOUS
  Filled 2022-02-09: qty 1

## 2022-02-09 MED ORDER — AMLODIPINE BESYLATE 5 MG PO TABS
5.0000 mg | ORAL_TABLET | Freq: Every day | ORAL | Status: DC
  Administered 2022-02-10: 5 mg via ORAL
  Filled 2022-02-09: qty 1

## 2022-02-09 MED ORDER — ONDANSETRON HCL 4 MG/2ML IJ SOLN
4.0000 mg | Freq: Once | INTRAMUSCULAR | Status: AC
  Administered 2022-02-09: 4 mg via INTRAVENOUS
  Filled 2022-02-09: qty 2

## 2022-02-09 MED ORDER — HYDRALAZINE HCL 20 MG/ML IJ SOLN: 10.0000 mg | Freq: Four times a day (QID) | INTRAMUSCULAR | Status: DC | PRN

## 2022-02-09 NOTE — Assessment & Plan Note (Signed)
?   Patient been placed on Accu-Cheks before every meal and nightly with sliding scale insulin ?? Holding home regimen of hypoglycemics ?? Hemoglobin A1C ordered ?? Diabetic Diet ?? Considering degree of hyperglycemia on arrival patient may benefit from basal bolus insulin therapy as opposed to patient's numerous oral and injectable hypoglycemics in the outpatient setting ? ?

## 2022-02-09 NOTE — ED Provider Notes (Signed)
? ?[facility] ?Provider Note ? ? ? Event Date/Time  ? First MD Initiated Contact with Patient 02/09/22 1803   ?  (approximate) ? ? ?History  ? ?Chief Complaint ?Chest Pain ? ? ?HPI ? ?Caleb [name] is a 46 y.o. male with past medical history of hypertension, hyperlipidemia, diabetes, CAD, alcohol abuse, and chronic pain syndrome who presents to the ED complaining of chest pain.  Patient reports that he initially noticed this morning that his blood sugar was fluctuating up and down, reaching as high as 400.  He then developed pain in the center of his chest about 3 hours ago.  He describes it as a constant tightness and squeezing that radiates towards his left arm.  The pain is not exacerbated or alleviated by anything in particular, is associated with some difficulty breathing but he denies any fevers or cough.  He has not noticed any pain or swelling in his legs.  He describes pain as similar to his MI last year as well as an episode of pain he had last month.  He was admitted to [place] at that time and catheterization showed "moderate disease similar to previous" that was managed medically. ?  ? ? ?Physical Exam  ? ?Triage Vital Signs: ?ED Triage Vitals [02/09/22 1758]  ?Enc Vitals Group  ?   BP 122/78  ?   Pulse Rate 90  ?   Resp 20  ?   Temp 98.4 ?F (36.9 ?C)  ?   Temp Source Oral  ?   SpO2 97 %  ?   Weight   ?   Height 6' (1.829 m)  ?   Head Circumference   ?   Peak Flow   ?   Pain Score 5  ?   Pain Loc   ?   Pain Edu?   ?   Excl. in [place]?   ? ? ?Most recent vital signs: ?Vitals:  ? 02/09/22 1830 02/09/22 1900  ?BP: 121/75 124/75  ?Pulse: 87 80  ?Resp: 19 (!) 24  ?Temp:    ?SpO2: 97% 96%  ? ? ?Constitutional: Alert and oriented. ?Eyes: Conjunctivae are normal. ?Head: Atraumatic. ?Nose: No congestion/rhinnorhea. ?Mouth/Throat: Mucous membranes are moist.  ?Cardiovascular: Normal rate, regular rhythm. Grossly normal heart sounds.  2+ radial pulses bilaterally. ?Respiratory: Normal  respiratory effort.  No retractions. Lungs CTAB.  No chest wall tenderness to palpation. ?Gastrointestinal: Soft and nontender. No distention. ?Musculoskeletal: No lower extremity tenderness nor edema.  ?Neurologic:  Normal speech and language. No gross focal neurologic deficits are appreciated. ? ? ? ?ED Results / Procedures / Treatments  ? ?Labs ?(all labs ordered are listed, but only abnormal results are displayed) ?Labs Reviewed  ?BASIC METABOLIC PANEL - Abnormal; Notable for the following components:  ?    Result Value  ? Potassium 3.2 (*)   ? Glucose, Bld 304 (*)   ? Calcium 8.8 (*)   ? All other components within normal limits  ?CBG MONITORING, ED - Abnormal; Notable for the following components:  ? Glucose-Capillary 321 (*)   ? All other components within normal limits  ?CBC  ?TROPONIN I (HIGH SENSITIVITY)  ?TROPONIN I (HIGH SENSITIVITY)  ? ? ? ?EKG ? ?ED ECG REPORT ?Tempie Hoist, the attending physician, personally viewed and interpreted this ECG. ? ? Date: 02/09/2022 ? EKG Time: 17:49 ? Rate: 96 ? Rhythm: normal sinus rhythm ? Axis: Normal ? Intervals:none ? ST&T Change: Inferior ST depressions with borderline elevation in avR,  V1, V2 ? ?RADIOLOGY ?Chest x-ray reviewed by me with no infiltrate, edema, or effusion. ? ?PROCEDURES: ? ?Critical Care performed: No ? ?Procedures ? ? ?MEDICATIONS ORDERED IN ED: ?Medications  ?LORazepam (ATIVAN) injection 1 mg (has no administration in time range)  ?nitroGLYCERIN (NITROSTAT) SL tablet 0.4 mg (0.4 mg Sublingual Given 02/09/22 1828)  ?morphine (PF) 4 MG/ML injection 4 mg (4 mg Intravenous Given 02/09/22 1842)  ?ondansetron [facility]) injection 4 mg (4 mg Intravenous Given 02/09/22 1839)  ? ? ? ?IMPRESSION / MDM / ASSESSMENT AND PLAN / ED COURSE  ?I reviewed the triage vital signs and the nursing notes. ?             ?               ? ?46 y.o. male with past medical history of hypertension, hyperlipidemia, diabetes, CAD, alcohol abuse, and chronic pain syndrome who  presents to the ED complaining of fluctuating blood sugar along with 3 hours of constant pain in the center of his chest starting earlier today. ? ?Differential diagnosis includes, but is not limited to, ACS, PE, pneumonia, pneumothorax, dissection, anxiety, GERD, and musculoskeletal pain. ? ?Patient is uncomfortable appearing but in no acute distress, vital signs are unremarkable.  Initial EKG is concerning for ACS with inferolateral ST depressions along with T wave inversions, borderline ST elevation noted in V1 and V2.  He had some improvement in pain following nitroglycerin x2 along with loading dose of aspirin, pain now worsening and we will give third dose of nitroglycerin.  EKG is unchanged on repeat, does not currently meet STEMI criteria.  Plan to check labs and chest x-ray, discuss with cardiology. ? ?EKG reviewed with Dr. Corky Sox of cardiology, who agrees that it does not meet STEMI criteria and recommends trending troponins and controlling chest pain.  Patient's chest pain is now improved following IV morphine and follow-up dose of nitroglycerin.  Initial troponin is within normal limits and remainder of labs are unremarkable with, no anemia or electrolyte abnormality noted.  Given EKG changes and high risk status, case discussed with hospitalist for admission. ? ?  ? ? ?FINAL CLINICAL IMPRESSION(S) / ED DIAGNOSES  ? ?Final diagnoses:  ?Nonspecific chest pain  ? ? ? ?Rx / DC Orders  ? ?ED Discharge Orders   ? ? None  ? ?  ? ? ? ?Note:  This document was prepared using Dragon voice recognition software and may include unintentional dictation errors. ?  ?Blake Divine, MD ?02/09/22 1954 ? ?

## 2022-02-09 NOTE — Assessment & Plan Note (Signed)
Continuing home regimen of daily PPI therapy.  

## 2022-02-09 NOTE — ED Notes (Signed)
Pharmacy called for nitro, pyxis out of stock.  ?

## 2022-02-09 NOTE — Assessment & Plan Note (Signed)
?   History of heavy alcohol use ?? Patient currently denies regular use ?? We will monitor for any evidence of withdrawal and consider initiation of CIWA protocol if this occurs. ?

## 2022-02-09 NOTE — ED Triage Notes (Signed)
Patient to ER via [facility] from home with complaints of substernal chest pain that radiates into left arm for approx 3 hours.  ? ?Reports he has been having issues all day controlling his blood sugar. States cbgs have been reading as high as 400. States he got dizzy, stumbled and possibly had a syncopal episode and fell into his washing machine. ? ?EMS administered x2 NTG sprays. Pain decreased from a 7/10 to a 5/10. Aspirin 324 also administered.  ?

## 2022-02-09 NOTE — Assessment & Plan Note (Signed)
?   Patient is presenting with chest discomfort with both typical and atypical features. ?? Review of medical records from [place] reveals that patient has had multiple cardiac catheterizations with most recent being in late February revealing no change in multivessel nonobstructive disease compared to prior cath in 2021. ?? Therefore, plaque rupture is unlikely ?? Monitoring patient on telemetry ?? Cycling cardiac enzymes, initial troponin so far is unremarkable ?? Obtaining D-dimer ?? Continue home regimen of aspirin ?? Continue home regimen of PPI, H2 blocker ?? As needed nitroglycerin for chest discomfort ?? We will request cardiology consultation for the morning to get their opinion on patient's presentation ?? N.p.o. after midnight in case they recommend any ischemic work-up ?

## 2022-02-09 NOTE — Assessment & Plan Note (Signed)
.   Patient is being counseled daily on smoking cessation. . Providing patient with nicotine replacement therapy during this hospitalization.   

## 2022-02-09 NOTE — Assessment & Plan Note (Addendum)
?   Waxing and waning atypical chest discomfort as detailed above ?? Monitoring patient on telemetry ?? Continue home regimen of antiplatelet therapy, lipid lowering therapy and AV nodal blocking therapy ?? NSTEMI in 09/2019 with DES to LAD.  Patient has had cardiac catheterizations in 2001 and again in 12/2021  revealing no change in multivessel nonobstructive disease compared to prior cath in 2021. ? ?

## 2022-02-09 NOTE — H&P (Signed)
?History and Physical  ? ? ?Patient: Caleb [name] MRN: [id number] DOA: 02/09/2022 ? ?Date of Service: the patient was seen and examined on 02/10/2022 ? ?Patient coming from: Home via EMS ? ?Chief Complaint:  ?Chief Complaint  ?Patient presents with  ? Chest Pain  ? ? ?HPI:  ? ?46 year old male with past medical history of coronary artery disease (NSTEMI S/P DES to LAD 09/2019 with repeat caths 10/2020 and 12/2021 revealing moderate nonobstructive disease), hyperlipidemia, nicotine dependence, hypertension, gastroesophageal reflux disease, non-insulin-dependent diabetes mellitus type 2, heavy alcohol use, obstructive sleep apnea, prior pulmonary embolism in the setting of knee surgery (2014), PTSD who presents to [facility] emergency department via EMS from home with complaints of chest discomfort. ? ?Of note, patient was recently hospitalized at [facility] on 12/24 and discharged on 12/28 after he presented with chest discomfort and concerns for unstable angina.  Cardiac catheterization was performed during that hospitalization revealing moderate multivessel disease however this was unchanged compared to prior cath.  Patient was discharged on 12/28 with outpatient follow-up. ? ?Patient explains that earlier in the day on 3/23 around approximately 3 PM he began to experience chest discomfort.  Patient explains that this chest discomfort is squeezing in quality, moderate to severe in intensity and midsternal in location.  This seems to radiate to the left shoulder and down the left arm.  There does not seem to be any association with exertion.  Patient denies shortness of breath, cough or fevers.  Patient denies any peripheral edema, PND or orthopnea. ? ?Due to patient's persisting symptoms EMS was contacted who promptly came to evaluate the patient and brought him into [facility] emergency department for evaluation. ? ?Upon evaluation in the emergency department initial troponin was found to be unremarkable  at 5.  Initial EKG was reviewed by the emergency department provider with some concern for minimal ST segment elevation in the anterior septal leads and therefore ER provider discussed and reviewed EKG with Dr. Corky Sox who felt that the findings were nonspecific and that the patient did not have a STEMI.  Due to ongoing chest discomfort in the setting of known coronary artery disease ER provider is requesting hospitalist group assess for admission to the hospital. ? ?Review of Systems: Review of Systems  ?Cardiovascular:  Positive for chest pain.  ? ? ?Past Medical History:  ?Diagnosis Date  ? Anxiety   ? Diabetes mellitus without complication ([place])   ? GERD (gastroesophageal reflux disease)   ? History of degenerative disc disease   ? Hypercholesteremia   ? Hypertension   ? Knee pain, bilateral   ? Myocardial infarct [facility])   ? PTSD (post-traumatic stress disorder)   ? Pulmonary embolism ([place])   ? Stroke [facility]) 2015  ? mold stroke per patient  ? ? ?Past Surgical History:  ?Procedure Laterality Date  ? COLONOSCOPY WITH PROPOFOL N/A 05/07/2017  ? Procedure: COLONOSCOPY WITH PROPOFOL;  Surgeon: Lollie Sails, MD;  Location: [facility] ENDOSCOPY;  Service: Endoscopy;  Laterality: N/A;  ? COLONOSCOPY WITH PROPOFOL N/A 04/11/2021  ? Procedure: COLONOSCOPY WITH PROPOFOL;  Surgeon: Virgel Manifold, MD;  Location: [facility] ENDOSCOPY;  Service: Endoscopy;  Laterality: N/A;  ? ESOPHAGOGASTRODUODENOSCOPY (EGD) WITH PROPOFOL N/A 05/07/2017  ? Procedure: ESOPHAGOGASTRODUODENOSCOPY (EGD) WITH PROPOFOL;  Surgeon: Lollie Sails, MD;  Location: [facility] ENDOSCOPY;  Service: Endoscopy;  Laterality: N/A;  ? HERNIA REPAIR    ? Knee surgeries    ? TONSILLECTOMY    ? ? ?Social History:  reports that he  has been smoking cigarettes. He has a 20.00 pack-year smoking history. He has never used smokeless tobacco. He reports that he does not currently use alcohol after a past usage of about 4.0 standard drinks per week. He reports that he does not  use drugs. ? ?Allergies  ?Allergen Reactions  ? Bee Venom Anaphylaxis  ? Iodinated Contrast Media Anaphylaxis and Swelling  ? Metformin And Related Anaphylaxis  ?  Unknown ?  ? Lisinopril Swelling  ? ? ?Family History  ?Problem Relation Age of Onset  ? Diabetes Mother   ? Cancer Paternal Grandmother   ? Cancer Paternal Grandfather   ? Heart attack Maternal Grandfather 50  ? ? ?Prior to Admission medications   ?Medication Sig Start Date End Date Taking? Authorizing Provider  ?Accu-Chek Softclix Lancets lancets 1 each 3 (three) times daily. 01/17/22   [provider]  ?albuterol (PROVENTIL HFA;VENTOLIN HFA) 108 (90 Base) MCG/ACT inhaler Inhale 2 puffs into the lungs every 6 (six) hours as needed for shortness of breath. 06/13/16   [provider]  ?aluminum-magnesium hydroxide-simethicone (MAALOX) 409-811-91 MG/5ML SUSP Take 30 mLs by mouth 4 (four) times daily -  before meals and at bedtime. 03/18/21   Carrie Mew, MD  ?amLODipine (NORVASC) 5 MG tablet Take by mouth. 12/17/20 12/17/21  [provider]  ?aspirin EC 81 MG tablet Take 81 mg by mouth daily.    [provider]  ?aspirin-sod bicarb-citric acid (ALKA-SELTZER) 325 MG TBEF tablet Take 650 mg by mouth every 6 (six) hours as needed (indigestion).  ?Patient not taking: Reported on 03/22/2021    [provider]  ?atorvastatin (LIPITOR) 80 MG tablet Take by mouth. 06/05/21 06/05/22  [provider]  ?b complex vitamins capsule Take 1 capsule by mouth daily.    [provider]  ?baclofen (LIORESAL) 10 MG tablet Take 2 tablets by mouth at bedtime.  ?Patient not taking: No sig reported    [provider]  ?Blood Glucose Monitoring Suppl (ACCU-CHEK GUIDE ME) w/Device KIT See admin instructions. 01/17/22   [provider]  ?Blood Glucose Monitoring Suppl (FIFTY50 GLUCOSE METER 2.0) w/Device KIT Check once daily 10/28/21 10/28/22  [provider]  ?carvedilol (COREG) 6.25 MG tablet Take  6.25 mg by mouth 2 (two) times daily. 06/13/20   [provider]  ?cetirizine (ZYRTEC) 10 MG tablet Take 10 mg by mouth daily.    [provider]  ?clopidogrel (PLAVIX) 75 MG tablet Take 75 mg by mouth daily. ?Patient not taking: Reported on 04/11/2021 06/14/20   [provider]  ?Cysteamine Bitartrate (PROCYSBI) 300 MG PACK Use 1 each 3 (three) times daily 01/17/22 01/17/23  [provider]  ?diazepam (VALIUM) 5 MG tablet Take 5 mg by mouth at bedtime.  01/14/15   [provider]  ?diazepam (VALIUM) 5 MG tablet Take by mouth. 09/02/21   [provider]  ?diclofenac sodium (VOLTAREN) 1 % GEL Apply 2 g topically daily as needed (pain).  ?Patient not taking: Reported on 03/22/2021    [provider]  ?Diphenhyd-Hydrocort-Nystatin (FIRST-DUKES MOUTHWASH) SUSP Use as directed 5 mLs in the mouth or throat 3 (three) times daily. ?Patient not taking: No sig reported 05/14/18   Carrie Mew, MD  ?diphenhydrAMINE (BENADRYL) 25 mg capsule Take 50 mg by mouth every 6 (six) hours as needed for itching or allergies.    [provider]  ?EPINEPHrine 0.3 mg/0.3 mL IJ SOAJ injection Inject into the muscle. 09/24/19   [provider]  ?erythromycin ophthalmic  ointment Place a 1/2 inch ribbon of ointment onto the upper eyelid margin. 01/24/22   Margarette Canada, NP  ?esomeprazole (NEXIUM) 20 MG capsule Take by mouth.    [provider]  ?ezetimibe (ZETIA) 10 MG tablet Take 1 tablet by mouth daily. 01/18/22 01/18/23  [provider]  ?famotidine (PEPCID) 20 MG tablet Take 1 tablet (20 mg total) by mouth 2 (two) times daily. 03/18/21   Carrie Mew, MD  ?fluticasone [facility]) 50 MCG/ACT nasal spray Place into the nose.    [provider]  ?glimepiride (AMARYL) 2 MG tablet Take by mouth. 01/17/22   [provider]  ?glimepiride (AMARYL) 4 MG tablet Take by mouth. 12/19/21 03/19/22  [provider]  ?glucose blood (FREESTYLE  INSULINX TEST) test strip daily. 04/25/21   [provider]  ?guaiFENesin (ROBITUSSIN) 100 MG/5ML SOLN Take 5 mLs (100 mg total) by mouth every 4 (four) hours as needed for cough or to loosen phle

## 2022-02-09 NOTE — Assessment & Plan Note (Signed)
.   Resume patients home regimen of oral antihypertensives . Titrate antihypertensive regimen as necessary to achieve adequate BP control . PRN intravenous antihypertensives for excessively elevated blood pressure   

## 2022-02-09 NOTE — Assessment & Plan Note (Signed)
?   While patient has a known history of OSA he currently does not use CPAP nightly ?? Supplemental oxygen nightly with sleep ? ?

## 2022-02-10 DIAGNOSIS — R079 Chest pain, unspecified: Secondary | ICD-10-CM | POA: Diagnosis not present

## 2022-02-10 DIAGNOSIS — I25119 Atherosclerotic heart disease of native coronary artery with unspecified angina pectoris: Secondary | ICD-10-CM | POA: Diagnosis not present

## 2022-02-10 DIAGNOSIS — I10 Essential (primary) hypertension: Secondary | ICD-10-CM | POA: Diagnosis not present

## 2022-02-10 LAB — COMPREHENSIVE METABOLIC PANEL
ALT: 31 U/L (ref 0–44)
AST: 21 U/L (ref 15–41)
Albumin: 3.4 g/dL — ABNORMAL LOW (ref 3.5–5.0)
Alkaline Phosphatase: 90 U/L (ref 38–126)
Anion gap: 5 (ref 5–15)
BUN: 11 mg/dL (ref 6–20)
CO2: 26 mmol/L (ref 22–32)
Calcium: 8.2 mg/dL — ABNORMAL LOW (ref 8.9–10.3)
Chloride: 105 mmol/L (ref 98–111)
Creatinine, Ser: 0.74 mg/dL (ref 0.61–1.24)
GFR, Estimated: >60 mL/min (ref >60)
Glucose, Bld: 276 mg/dL — ABNORMAL HIGH (ref 70–99)
Potassium: 3.6 mmol/L (ref 3.5–5.1)
Sodium: 136 mmol/L (ref 135–145)
Total Bilirubin: 0.5 mg/dL (ref 0.3–1.2)
Total Protein: 6.5 g/dL (ref 6.5–8.1)

## 2022-02-10 LAB — CBC WITH DIFFERENTIAL/PLATELET
Abs Immature Granulocytes: 0.01 10*3/uL (ref 0.00–0.07)
Basophils Absolute: 0 10*3/uL (ref 0.0–0.1)
Basophils Relative: 0 %
Eosinophils Absolute: 0.7 10*3/uL — ABNORMAL HIGH (ref 0.0–0.5)
Eosinophils Relative: 10 %
HCT: 42.1 % (ref 39.0–52.0)
Hemoglobin: 14.6 g/dL (ref 13.0–17.0)
Immature Granulocytes: 0 %
Lymphocytes Relative: 42 %
Lymphs Abs: 3.2 10*3/uL (ref 0.7–4.0)
MCH: 30.9 pg (ref 26.0–34.0)
MCHC: 34.7 g/dL (ref 30.0–36.0)
MCV: 89 fL (ref 80.0–100.0)
Monocytes Absolute: 0.5 10*3/uL (ref 0.1–1.0)
Monocytes Relative: 6 %
Neutro Abs: 3.2 10*3/uL (ref 1.7–7.7)
Neutrophils Relative %: 42 %
Platelets: 213 10*3/uL (ref 150–400)
RBC: 4.73 MIL/uL (ref 4.22–5.81)
RDW: 12.8 % (ref 11.5–15.5)
WBC: 7.6 10*3/uL (ref 4.0–10.5)
nRBC: 0 % (ref 0.0–0.2)

## 2022-02-10 LAB — HEMOGLOBIN A1C
Hgb A1c MFr Bld: 9.4 % — ABNORMAL HIGH (ref 4.8–5.6)
Mean Plasma Glucose: 223.08 mg/dL

## 2022-02-10 LAB — GLUCOSE, CAPILLARY
Glucose-Capillary: 215 mg/dL — ABNORMAL HIGH (ref 70–99)
Glucose-Capillary: 274 mg/dL — ABNORMAL HIGH (ref 70–99)

## 2022-02-10 LAB — TROPONIN I (HIGH SENSITIVITY): Troponin I (High Sensitivity): 5 ng/L (ref <18)

## 2022-02-10 LAB — MAGNESIUM: Magnesium: 1.9 mg/dL (ref 1.7–2.4)

## 2022-02-10 LAB — HIV ANTIBODY (ROUTINE TESTING W REFLEX): HIV Screen 4th Generation wRfx: NONREACTIVE

## 2022-02-10 MED ORDER — ISOSORBIDE MONONITRATE ER 30 MG PO TB24: 30.0000 mg | ORAL_TABLET | Freq: Every day | ORAL | 0 refills | Status: DC

## 2022-02-10 MED ORDER — NICOTINE 21 MG/24HR TD PT24
21.0000 mg | MEDICATED_PATCH | Freq: Every day | TRANSDERMAL | Status: DC
  Administered 2022-02-10: 21 mg via TRANSDERMAL
  Filled 2022-02-10: qty 1

## 2022-02-10 MED ORDER — TROLAMINE SALICYLATE 10 % EX CREA
TOPICAL_CREAM | CUTANEOUS | Status: DC | PRN
  Filled 2022-02-10: qty 85

## 2022-02-10 MED ORDER — MORPHINE SULFATE (PF) 4 MG/ML IV SOLN
4.0000 mg | Freq: Once | INTRAVENOUS | Status: AC
  Administered 2022-02-10: 4 mg via INTRAVENOUS
  Filled 2022-02-10: qty 1

## 2022-02-10 MED ORDER — MORPHINE SULFATE (PF) 2 MG/ML IV SOLN
2.0000 mg | INTRAVENOUS | Status: DC | PRN
  Administered 2022-02-10: 2 mg via INTRAVENOUS
  Filled 2022-02-10: qty 1

## 2022-02-10 MED ORDER — ISOSORBIDE MONONITRATE ER 30 MG PO TB24
30.0000 mg | ORAL_TABLET | Freq: Every day | ORAL | Status: DC
  Administered 2022-02-10: 30 mg via ORAL
  Filled 2022-02-10: qty 1

## 2022-02-10 NOTE — Consult Note (Signed)
?[facility] CONSULT NOTE  ? ?    ?Patient ID: ?Caleb [name] ?MRN: [id number] ?DOB/AGE: [birth year] 46 y.o. ? ?Admit date: 02/09/2022 ?Referring Physician Dr. Jimmye Norman ?Primary Physician Dr. Venida Jarvis [facility] family med ?Primary Cardiologist [facility]  ?Reason for Consultation chest pain ? ?HPI: The patient is a 46 year old male with a past medical history of CAD s/p PCI with DES x1 to mid LAD bifurcation 09/23/2019 and recent LHC 01/16/2022 at [facility] showing moderate CAD without clear intervention target, treated medically, tobacco abuse, poorly controlled type 2 diabetes, hypertension, hyperlipidemia, DDD on chronic opioids, anxiety, who presented to [facility] ED 02/10/2020 with chest discomfort.  Cardiology is consulted for further assistance. ? ?The patient is seen in the PCU this morning, comfortably sleeping upon my arrival in the room.  He states that yesterday he noticed his blood sugar was severely elevated to 300-400 and then shortly after he was driving his car noted a vague chest pressure/"hurting" he rated a 5-7/10 and was constant in nature.  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  He cannot tell me any aggravating or relieving factors for his symptoms yesterday, but states this morning his pain is now mostly in his back and rates his chest discomfort a 4/10.  He says last night he went to brush his teeth and all of a sudden blacked out and landed on the floor, which he attributes to his worsening back pain.  He denied hitting his head during the fall and eventually got himself back into bed, but declined diagnostic head CT as per hospital policy.  During interview today he is mostly concerned about his blood sugar being very elevated. ? ?He is a daily smoker about half pack a day since the age of 89 when he was in the [employer].  He has been sober from alcohol for 1 year, used to drink a significant amount daily  per patient.  Denies use of nonprescribed narcotics or illicit substances. ? ?Vitals are notable for blood pressure of 134/83 heart rate 78.  Labs show initially potassium 3.2, improved to 3.6 this morning.  Blood glucose 304, repeat 276 this morning.  Creatinine 0.74, EGFR greater than 60.  High-sensitivity troponin is not elevated at 5 - 5 - 5.  D-dimer negative for 0.32.  Hemoglobin A1c 9.4. ? ? ?Review of systems complete and found to be negative unless listed above  ? ? ? ?Past Medical History:  ?Diagnosis Date  ? Anxiety   ? Diabetes mellitus without complication ([place])   ? GERD (gastroesophageal reflux disease)   ? History of degenerative disc disease   ? Hypercholesteremia   ? Hypertension   ? Knee pain, bilateral   ? Myocardial infarct [facility])   ? PTSD (post-traumatic stress disorder)   ? Pulmonary embolism ([place])   ? Stroke [facility]) 2015  ? mold stroke per patient  ?  ?Past Surgical History:  ?Procedure Laterality Date  ? COLONOSCOPY WITH PROPOFOL N/A 05/07/2017  ? Procedure: COLONOSCOPY WITH PROPOFOL;  Surgeon: Lollie Sails, MD;  Location: [facility] ENDOSCOPY;  Service: Endoscopy;  Laterality: N/A;  ? COLONOSCOPY WITH PROPOFOL N/A 04/11/2021  ? Procedure: COLONOSCOPY WITH PROPOFOL;  Surgeon: Virgel Manifold, MD;  Location: [facility] ENDOSCOPY;  Service: Endoscopy;  Laterality: N/A;  ? ESOPHAGOGASTRODUODENOSCOPY (EGD) WITH PROPOFOL N/A 05/07/2017  ? Procedure: ESOPHAGOGASTRODUODENOSCOPY (EGD) WITH PROPOFOL;  Surgeon: Lollie Sails, MD;  Location: [facility] ENDOSCOPY;  Service: Endoscopy;  Laterality: N/A;  ? HERNIA REPAIR    ? Knee surgeries    ? TONSILLECTOMY    ?  ?Medications Prior to Admission  ?Medication Sig Dispense Refill Last Dose  ? Accu-Chek Softclix Lancets lancets 1 each 3 (three) times daily.     ? albuterol (PROVENTIL HFA;VENTOLIN HFA) 108 (90 Base) MCG/ACT inhaler Inhale 2 puffs into the lungs every 6 (six) hours as needed for shortness of breath.     ? aluminum-magnesium hydroxide-simethicone  (MAALOX) [phone] MG/5ML SUSP Take 30 mLs by mouth 4 (four) times daily -  before meals and at bedtime. 355 mL 0   ? amLODipine (NORVASC) 5 MG tablet Take by mouth.     ? aspirin EC 81 MG tablet Take 81 mg by mouth daily.     ? aspirin-sod bicarb-citric acid (ALKA-SELTZER) 325 MG TBEF tablet Take 650 mg by mouth every 6 (six) hours as needed (indigestion).  (Patient not taking: Reported on 03/22/2021)     ? atorvastatin (LIPITOR) 80 MG tablet Take by mouth.     ? b complex vitamins capsule Take 1 capsule by mouth daily.     ? baclofen (LIORESAL) 10 MG tablet Take 2 tablets by mouth at bedtime.  (Patient not taking: No sig reported)     ? Blood Glucose Monitoring Suppl (ACCU-CHEK GUIDE ME) w/Device KIT See admin instructions.     ? Blood Glucose Monitoring Suppl (FIFTY50 GLUCOSE METER 2.0) w/Device KIT Check once daily     ? carvedilol (COREG) 6.25 MG tablet Take 6.25 mg by mouth 2 (two) times daily.     ? cetirizine (ZYRTEC) 10 MG tablet Take 10 mg by mouth daily.     ? clopidogrel (PLAVIX) 75 MG tablet Take 75 mg by mouth daily. (Patient not taking: Reported on 04/11/2021)     ? Cysteamine Bitartrate (PROCYSBI) 300 MG PACK Use 1 each 3 (three) times daily     ? diazepam (VALIUM) 5 MG tablet Take 5 mg by mouth at bedtime.      ? diazepam (VALIUM) 5 MG tablet Take by mouth.     ? diclofenac sodium (VOLTAREN) 1 % GEL Apply 2 g topically daily as needed (pain).  (Patient not taking: Reported on 03/22/2021)     ? Diphenhyd-Hydrocort-Nystatin (FIRST-DUKES MOUTHWASH) SUSP Use as directed 5 mLs in the mouth or throat 3 (three) times daily. (Patient not taking: No sig reported) 1 Bottle 0   ? diphenhydrAMINE (BENADRYL) 25 mg capsule Take 50 mg by mouth every 6 (six) hours as needed for itching or allergies.     ? EPINEPHrine 0.3 mg/0.3 mL IJ SOAJ injection Inject into the muscle.     ? erythromycin ophthalmic ointment Place a 1/2 inch ribbon of ointment onto the upper eyelid margin. 3.5 g 0   ? esomeprazole (NEXIUM) 20 MG  capsule Take by mouth.     ? ezetimibe (ZETIA) 10 MG tablet Take 1 tablet by mouth daily.     ? famotidine (PEPCID) 20 MG tablet Take 1 tablet (20 mg total) by mouth 2 (two) times daily. 60 tablet 0   ? fluticasone (FLONASE) 50 MCG/ACT nasal spray Place into the nose.     ? glimepiride (AMARYL) 2 MG tablet Take by mouth.     ? glimepiride (AMARYL) 4 MG tablet Take by mouth.     ? glucose blood (FREESTYLE INSULINX TEST) test strip daily.     ? guaiFENesin (ROBITUSSIN) 100 MG/5ML SOLN Take 5 mLs (100 mg total) by  mouth every 4 (four) hours as needed for cough or to loosen phlegm. (Patient not taking: No sig reported) 120 mL 0   ? hydrOXYzine (VISTARIL) 50 MG capsule Take 50 mg by mouth daily.     ? isosorbide mononitrate (IMDUR) 30 MG 24 hr tablet Take by mouth.     ? isosorbide mononitrate (IMDUR) 60 MG 24 hr tablet Take 60 mg by mouth daily.     ? Lancets (FREESTYLE) lancets daily.     ? liraglutide (VICTOZA) 18 MG/3ML SOPN Inject 1.2 mg into the skin daily. (Patient not taking: No sig reported)     ? losartan (COZAAR) 100 MG tablet Take by mouth.     ? losartan-hydrochlorothiazide (HYZAAR) 100-25 MG tablet Take 1 tablet by mouth daily.     ? Menthol, Topical Analgesic, (ICY HOT EX) Apply 1 application topically daily as needed (pain). (Patient not taking: Reported on 03/22/2021)     ? metoCLOPramide (REGLAN) 10 MG tablet Take 1 tablet (10 mg total) by mouth every 6 (six) hours as needed. 30 tablet 0   ? metoprolol succinate (TOPROL-XL) 50 MG 24 hr tablet Take 50 mg by mouth daily.     ? metoprolol tartrate (LOPRESSOR) 25 MG tablet Take 25 mg by mouth 2 (two) times daily.     ? MONTELUKAST SODIUM PO Take 1 tablet by mouth daily. (Patient not taking: No sig reported)     ? naproxen (NAPROSYN) 500 MG tablet Take 1 tablet (500 mg total) by mouth 2 (two) times daily with a meal. 30 tablet 0   ? nicotine (NICODERM CQ - DOSED IN MG/24 HOURS) 21 mg/24hr patch Place onto the skin.     ? nortriptyline (PAMELOR) 25 MG capsule  Take 25 mg by mouth at bedtime.     ? ondansetron (ZOFRAN-ODT) 4 MG disintegrating tablet 4 mg every 8 (eight) hours as needed.     ? oxyCODONE (ROXICODONE) 15 MG immediate release tablet Take by mouth.     ? o

## 2022-02-10 NOTE — TOC CM/SW Note (Signed)
Patient has orders to discharge home today. Chart reviewed. PCP is Venida Jarvis, MD. On room air. No wounds. Received consult for medication assistance but patient has [employer]. No TOC needs identified. CSW signing off. ? ?[place] Scrape, [place] ?[phone] ? ?

## 2022-02-10 NOTE — Progress Notes (Signed)
?   02/10/22 0031  ?What Happened  ?Was fall witnessed? No  ?Was patient injured? Unsure  ?Patient found in bathroom  ?Found by Staff-comment  ?Stated prior activity ambulating-unassisted  ?Follow Up  ?MD notified B. Randol Kern, NP  ?Time MD notified [phone]  ?Family notified No - patient refusal  ?Additional tests Yes-comment ?(NP made aware, RN awaiting orders)  ?Simple treatment Other (comment) ?(PRN pain medications)  ?Adult Fall Risk Assessment  ?Risk Factor Category (scoring not indicated) Fall has occurred during this admission (document High fall risk)  ?Age 46  ?Fall History: Fall within 6 months prior to admission 0  ?Elimination; Bowel and/or Urine Incontinence 0  ?Elimination; Bowel and/or Urine Urgency/Frequency 0  ?Medications: includes PCA/Opiates, Anti-convulsants, Anti-hypertensives, Diuretics, Hypnotics, Laxatives, Sedatives, and Psychotropics 3  ?Patient Care Equipment 0  ?Mobility-Assistance 0  ?Mobility-Gait 0  ?Mobility-Sensory Deficit 0  ?Altered awareness of immediate physical environment 0  ?Impulsiveness 0  ?Lack of understanding of one's physical/cognitive limitations 0  ?Total Score 3  ?Patient Fall Risk Level High fall risk  ?Adult Fall Risk Interventions  ?Required Bundle Interventions *See Row Information* High fall risk - low, moderate, and high requirements implemented  ?Additional Interventions PT/OT need assessed if change in mobility from baseline;Use of appropriate toileting equipment (bedpan, BSC, etc.)  ?Screening for Fall Injury Risk (To be completed on HIGH fall risk patients) - Assessing Need for Floor Mats  ?Risk For Fall Injury- Criteria for Floor Mats Noncompliant with safety precautions  ?Pain Assessment  ?Pain Scale 0-10  ?Pain Score 9  ?Pain Type Chronic pain;Acute pain  ?Pain Location Back  ?Pain Orientation Lower  ?Pain Descriptors / Indicators Aching  ?Pain Frequency Constant  ?Pain Onset On-going  ?Neurological  ?Neuro (WDL) WDL  ?Level of Consciousness Alert   ?Musculoskeletal  ?Musculoskeletal (WDL) X  ?Generalized Weakness Yes  ?Weight Bearing Restrictions No  ?Musculoskeletal Details  ?RLE Weakness  ?LLE Weakness  ?Right Knee Weakness  ?Left Knee Weakness  ?Integumentary  ?Integumentary (WDL) WDL  ?Pain Assessment  ?Date Pain First Started  ?(Chronic degenerative disc disease)  ?Result of Injury No ?(Family history)  ?Pain Assessment  ?Work-Related Injury No  ?Pain Screening  ?Response to Interventions moderate relief  ?Effect of Pain on Daily Activities per pt report, does affect his daily activities  ?Clinical Progression Not changed  ? ? ?

## 2022-02-10 NOTE — Progress Notes (Signed)
Pt arrived to floor via stretcher from ED. Pt A&Ox4, ambulatory, in no apparent cardiac or respiratory distress. VSS, Focus assessment completed per hospital protocol. Cardiac monitor per order. Pt in NSR. Bed locked and in lowest position with call bell and bedside table within reach. RN assisted pt with using urinal. Pt instructed to call for assistance, urinal within reach. RN will continue to monitor.  ?

## 2022-02-10 NOTE — Progress Notes (Addendum)
Inpatient Diabetes Program Recommendations ? ?AACE/ADA: New Consensus Statement on Inpatient Glycemic Control (2015) ? ?Target Ranges:  Prepandial:   less than 140 mg/dL ?     Peak postprandial:   less than 180 mg/dL (1-2 hours) ?     Critically ill patients:  140 - 180 mg/dL  ? ?Lab Results  ?Component Value Date  ? GLUCAP 274 (H) 02/10/2022  ? HGBA1C 9.4 (H) 02/10/2022  ? ? ?Review of Glycemic Control ? ?Diabetes history: DM 2 ?Outpatient Diabetes medications: Amaryl 4 mg bid (recent dose increase 2-3 weeks ago) ?Current orders for Inpatient glycemic control:  ?Novolog 0-15 units tid + hs ? ?A1c 9.4% on 02/10/22 ? ?Spoke with pt at bedside and wife on speaker phone. Pt recently had a dose change in his Amaryl to 4 mg bid. Pt is no longer on injectables due to cost. Reviewed current A1c level. Discussed glucose and A1c goals. Emphasized the need for glucose control. Wife reports pt not being compliant with diet. I reviewed lifestyle modifications with pt. I do not think would be willing to take insulin at home. ? ?May consider Semglee 10 units Daily. ? ?Thanks, ? ?Tama Headings RN, MSN, BC-ADM ?Inpatient Diabetes Coordinator ?Team Pager [phone] (8a-5p) ? ? ? ? ? ?

## 2022-02-10 NOTE — Progress Notes (Addendum)
Pt refuses to do mandatory CT(head) after his unwitnessed fall. Pt states, "I will be leaving in the morning for [facility], there is no reason for me to do a head CT when I did not hit my head. RN educated pt on the importance of the diagnostic test. Pt declined head CT diagnostic test ?

## 2022-02-10 NOTE — Progress Notes (Signed)
Cross Cover ?Patient had unwitnessed fall No apparent injuries. Per policy, head CT ordered,, mandatory for unwitnessed fall ? ?

## 2022-02-10 NOTE — Discharge Summary (Signed)
Physician Discharge Summary  ?Caleb [name] EQA:[id number] DOB: 10-14-1976 DOA: 02/09/2022 ? ?PCP: Venida Jarvis, MD ? ?Admit date: 02/09/2022 ?Discharge date: 02/10/2022 ? ?Admitted From:  home ?Disposition:  home  ? ?Recommendations for Outpatient Follow-up:  ?Follow up with PCP in 1-2 weeks ?F/u w/ cardio at [facility] w/in 1 week  ? ?Home Health: no  ?Equipment/Devices: ? ?Discharge Condition: stable  ?CODE STATUS: full  ?Diet recommendation: Heart Healthy / Carb Modified ? ?Brief/Interim Summary: ?HPI was taken from Dr. Cyd Silence: ?46 year old male with past medical history of coronary artery disease (NSTEMI S/P DES to LAD 09/2019 with repeat caths 10/2020 and 12/2021 revealing moderate nonobstructive disease), hyperlipidemia, nicotine dependence, hypertension, gastroesophageal reflux disease, non-insulin-dependent diabetes mellitus type 2, heavy alcohol use, obstructive sleep apnea, prior pulmonary embolism in the setting of knee surgery (2014), PTSD who presents to [facility] emergency department via EMS from home with complaints of chest discomfort. ?  ?Of note, patient was recently hospitalized at [facility] on 12/24 and discharged on 12/28 after he presented with chest discomfort and concerns for unstable angina.  Cardiac catheterization was performed during that hospitalization revealing moderate multivessel disease however this was unchanged compared to prior cath.  Patient was discharged on 12/28 with outpatient follow-up. ?  ?Patient explains that earlier in the day on 3/23 around approximately 3 PM he began to experience chest discomfort.  Patient explains that this chest discomfort is squeezing in quality, moderate to severe in intensity and midsternal in location.  This seems to radiate to the left shoulder and down the left arm.  There does not seem to be any association with exertion.  Patient denies shortness of breath, cough or fevers.  Patient denies any peripheral edema, PND or  orthopnea. ?  ?Due to patient's persisting symptoms EMS was contacted who promptly came to evaluate the patient and brought him into [facility] emergency department for evaluation. ?  ?Upon evaluation in the emergency department initial troponin was found to be unremarkable at 5.  Initial EKG was reviewed by the emergency department provider with some concern for minimal ST segment elevation in the anterior septal leads and therefore ER provider discussed and reviewed EKG with Dr. Corky Sox who felt that the findings were nonspecific and that the patient did not have a STEMI.  Due to ongoing chest discomfort in the setting of known coronary artery disease ER provider is requesting hospitalist group assess for admission to the hospital. ? ?As per Dr. Jimmye Norman 02/10/22:  ?Cardio was consulted for chest pain. Pt evidently had an extensive cardiac work-up at [facility] less than 1 month as per cardio. No further inpatient cardiac work-up was recommended and pt should f/u w/ his cardiologist at [facility] w/in 1 week as per cardio. For more information, see previous progress/consult notes.  ? ?Discharge Diagnoses:  ?Principal Problem: ?  Chest pain ?Active Problems: ?  Coronary artery disease involving native coronary artery of native heart ?  Uncontrolled type 2 diabetes mellitus with hyperglycemia, without long-term current use of insulin ([place]) ?  Essential hypertension ?  History of alcohol abuse ?  Nicotine dependence, cigarettes, uncomplicated ?  Gastroesophageal reflux disease without esophagitis ?  OSA (obstructive sleep apnea) ? ?Chest pain: troponins neg x 3. Continue on aspirin, statin, imdur, metoprolol & losartan. Continue on tele. Echo done less 1 month ago at [facility] as per cardio. No further inpatient cardiac work-up as per cardio. Pt will f/u outpatient w/ his cardiologist at [facility] w/in 1 week ? ?Hx of CAD: s/p  cardiac caths in 2001 & 2023. 2023 cath showing multivessel nonobstructive disease compared to prior cath in 2021 ? ?Fall:  unwitnessed & overnight. CT head ordered but pt refused stating he did not hit his head  ? ?Chronic back pain: continue on home dose of oxycodone  ? ?DM2: poorly controlled. Continue on SSI w/ accuchecks while inpatient ? ?HTN: continue on metoprolol, losartan, imdur ? ?Hx of alcohol abuse: pt denies regular use. Will monitor of signs/symptoms of w/drawl ? ?Smoker: received smoking cessation counseling. Nicotine patch to prevent w/drawl ? ?GERD: continue on PPI ? ?OSA: does not use CPAP qhs  ? ?Obesity: BMI 31.1. Would benefit from weight loss  ? ?Discharge Instructions ? ?Discharge Instructions   ? ? Diet - low sodium heart healthy   Complete by: As directed ?  ? Diet Carb Modified   Complete by: As directed ?  ? Discharge instructions   Complete by: As directed ?  ? F/u w/ cardio at [facility] within 1 week. F/u w/ PCP in 1-2 weeks  ? Increase activity slowly   Complete by: As directed ?  ? ?  ? ?Allergies as of 02/10/2022   ? ?   Reactions  ? Bee Venom Anaphylaxis  ? Iodinated Contrast Media Anaphylaxis, Swelling  ? Metformin And Related Anaphylaxis  ? Unknown  ? Lisinopril Swelling  ? ?  ? ?  ?Medication List  ?  ? ?STOP taking these medications   ? ?diclofenac sodium 1 % Gel ?Commonly known as: VOLTAREN ?  ?ICY HOT EX ?  ?losartan-hydrochlorothiazide 100-25 MG tablet ?Commonly known as: HYZAAR ?  ?metoprolol tartrate 25 MG tablet ?Commonly known as: LOPRESSOR ?  ?oxymetazoline 0.05 % nasal spray ?Commonly known as: AFRIN ?  ? ?  ? ?TAKE these medications   ? ?Fifty50 Glucose Meter 2.0 w/Device Kit ?Check once daily ?  ?Accu-Chek Guide Me w/Device Kit ?See admin instructions. ?  ?freestyle lancets ?daily. ?  ?Accu-Chek Softclix Lancets lancets ?1 each 3 (three) times daily. ?  ?amLODipine 5 MG tablet ?Commonly known as: NORVASC ?Take 5 mg by mouth daily. ?  ?aspirin EC 81 MG tablet ?Take 81 mg by mouth daily. ?  ?atorvastatin 80 MG tablet ?Commonly known as: LIPITOR ?Take 80 mg by mouth daily. ?  ?cetirizine 10 MG  tablet ?Commonly known as: ZYRTEC ?Take 10 mg by mouth daily. ?  ?clopidogrel 75 MG tablet ?Commonly known as: PLAVIX ?Take 75 mg by mouth daily. ?  ?diphenhydrAMINE 25 mg capsule ?Commonly known as: BENADRYL ?Take 50 mg by mouth every 6 (six) hours as needed for itching or allergies. ?  ?EPINEPHrine 0.3 mg/0.3 mL Soaj injection ?Commonly known as: EPI-PEN ?Inject into the muscle. ?  ?erythromycin ophthalmic ointment ?Place a 1/2 inch ribbon of ointment onto the upper eyelid margin. ?  ?esomeprazole 20 MG capsule ?Commonly known as: [place] ?Take by mouth. ?  ?ezetimibe 10 MG tablet ?Commonly known as: ZETIA ?Take 10 mg by mouth daily. ?  ?FreeStyle InsuLinx Test test strip ?Generic drug: glucose blood ?daily. ?  ?glimepiride 2 MG tablet ?Commonly known as: AMARYL ?Take 2-4 mg by mouth 2 (two) times daily. ?What changed: Another medication with the same name was removed. Continue taking this medication, and follow the directions you see here. ?  ?isosorbide mononitrate 30 MG 24 hr tablet ?Commonly known as: IMDUR ?Take 1 tablet (30 mg total) by mouth daily. ?Start taking on: February 11, 2022 ?What changed:  ?how much to take ?when to take  this ?Another medication with the same name was removed. Continue taking this medication, and follow the directions you see here. ?  ?losartan 100 MG tablet ?Commonly known as: COZAAR ?Take 100 mg by mouth daily. ?  ?metoprolol succinate 50 MG 24 hr tablet ?Commonly known as: TOPROL-XL ?Take 50 mg by mouth daily. ?  ?MONTELUKAST SODIUM PO ?Take 1 tablet by mouth daily. ?  ?nicotine 21 mg/24hr patch ?Commonly known as: NICODERM CQ - dosed in mg/24 hours ?Place onto the skin. ?  ?nortriptyline 25 MG capsule ?Commonly known as: PAMELOR ?Take 25 mg by mouth at bedtime. ?  ?oxyCODONE 15 MG immediate release tablet ?Commonly known as: ROXICODONE ?Take 15 mg by mouth 5 (five) times daily. ?  ?Procysbi 300 MG Pack ?Generic drug: Cysteamine Bitartrate ?Use 1 each 3 (three) times daily ?   ?ranolazine 500 MG 12 hr tablet ?Commonly known as: RANEXA ?Take 500 mg by mouth 2 (two) times daily. ?  ?Semaglutide(0.25 or 0.5MG/DOS) 2 MG/1.5ML Sopn ?Inject into the skin. ?  ? ?  ? ? ?Allergies  ?Allergen React

## 2022-02-28 ENCOUNTER — Other Ambulatory Visit: Payer: Self-pay

## 2022-02-28 ENCOUNTER — Telehealth: Payer: Self-pay | Admitting: Gastroenterology

## 2022-02-28 DIAGNOSIS — Z86010 Personal history of colonic polyps: Secondary | ICD-10-CM

## 2022-02-28 MED ORDER — PEG 3350-KCL-NA BICARB-NACL 420 G PO SOLR: 4000.0000 mL | Freq: Once | ORAL | 0 refills | Status: AC

## 2022-02-28 NOTE — Telephone Encounter (Signed)
Pt left message to sche. colonoscpy ?

## 2022-02-28 NOTE — Progress Notes (Signed)
Gastroenterology Pre-Procedure Review ? ?Request Date: 03/21/2022 ?Requesting Physician: Dr. Marius Ditch ? ?PATIENT REVIEW QUESTIONS: The patient responded to the following health history questions as indicated:   ? ?1. Are you having any GI issues? no ?2. Do you have a personal history of Polyps? yes (last colonoscopy ) ?3. Do you have a family history of Colon Cancer or Polyps? no ?4. Diabetes Mellitus? yes (type 2) ?5. Joint replacements in the past 12 months?no ?6. Major health problems in the past 3 months?no ?7. Any artificial heart valves, MVP, or defibrillator?yes (stints in heart) ?   ?MEDICATIONS & ALLERGIES:    ?Patient reports the following regarding taking any anticoagulation/antiplatelet therapy:   ?Plavix, Coumadin, Eliquis, Xarelto, Lovenox, Pradaxa, Brilinta, or Effient? yes (plavix) ?Aspirin? yes (81 mg) ? ?Patient confirms/reports the following medications:  ?Current Outpatient Medications  ?Medication Sig Dispense Refill  ? Accu-Chek Softclix Lancets lancets 1 each 3 (three) times daily.    ? amLODipine (NORVASC) 5 MG tablet Take 5 mg by mouth daily.    ? aspirin EC 81 MG tablet Take 81 mg by mouth daily.    ? atorvastatin (LIPITOR) 80 MG tablet Take 80 mg by mouth daily.    ? Blood Glucose Monitoring Suppl (ACCU-CHEK GUIDE ME) w/Device KIT See admin instructions.    ? Blood Glucose Monitoring Suppl (FIFTY50 GLUCOSE METER 2.0) w/Device KIT Check once daily    ? cetirizine (ZYRTEC) 10 MG tablet Take 10 mg by mouth daily.    ? clopidogrel (PLAVIX) 75 MG tablet Take 75 mg by mouth daily.    ? Cysteamine Bitartrate (PROCYSBI) 300 MG PACK Use 1 each 3 (three) times daily    ? diphenhydrAMINE (BENADRYL) 25 mg capsule Take 50 mg by mouth every 6 (six) hours as needed for itching or allergies.    ? EPINEPHrine 0.3 mg/0.3 mL IJ SOAJ injection Inject into the muscle.    ? erythromycin ophthalmic ointment Place a 1/2 inch ribbon of ointment onto the upper eyelid margin. 3.5 g 0  ? esomeprazole (NEXIUM) 20 MG  capsule Take by mouth. (Patient not taking: Reported on 02/10/2022)    ? ezetimibe (ZETIA) 10 MG tablet Take 10 mg by mouth daily.    ? glimepiride (AMARYL) 2 MG tablet Take 2-4 mg by mouth 2 (two) times daily.    ? glucose blood (FREESTYLE INSULINX TEST) test strip daily.    ? isosorbide mononitrate (IMDUR) 30 MG 24 hr tablet Take 1 tablet (30 mg total) by mouth daily. 30 tablet 0  ? Lancets (FREESTYLE) lancets daily.    ? losartan (COZAAR) 100 MG tablet Take 100 mg by mouth daily.    ? metoprolol succinate (TOPROL-XL) 50 MG 24 hr tablet Take 50 mg by mouth daily.    ? MONTELUKAST SODIUM PO Take 1 tablet by mouth daily. (Patient not taking: Reported on 07/12/2020)    ? nicotine (NICODERM CQ - DOSED IN MG/24 HOURS) 21 mg/24hr patch Place onto the skin. (Patient not taking: Reported on 02/10/2022)    ? nortriptyline (PAMELOR) 25 MG capsule Take 25 mg by mouth at bedtime.    ? oxyCODONE (ROXICODONE) 15 MG immediate release tablet Take 15 mg by mouth 5 (five) times daily.    ? ranolazine (RANEXA) 500 MG 12 hr tablet Take 500 mg by mouth 2 (two) times daily. (Patient not taking: Reported on 02/10/2022)    ? ?No current facility-administered medications for this visit.  ? ? ?Patient confirms/reports the following allergies:  ?Allergies  ?Allergen Reactions  ?  Bee Venom Anaphylaxis  ? Iodinated Contrast Media Anaphylaxis and Swelling  ? Metformin And Related Anaphylaxis  ?  Unknown ?  ? Lisinopril Swelling  ? ? ?No orders of the defined types were placed in this encounter. ? ? ?AUTHORIZATION INFORMATION ?Primary Insurance: ?1D#: ?Group #: ? ?Secondary Insurance: ?1D#: ?Group #: ? ?SCHEDULE INFORMATION: ?Date: 03/21/2022 ?Time: ?Location:[facility] ? ?

## 2022-03-21 ENCOUNTER — Ambulatory Visit: Payer: Medicare HMO | Admitting: Registered Nurse

## 2022-03-21 ENCOUNTER — Ambulatory Visit
Admission: RE | Admit: 2022-03-21 | Discharge: 2022-03-21 | Disposition: A | Discharge status: Home / Self Care | Payer: Medicare HMO | Attending: Gastroenterology | Admitting: Gastroenterology

## 2022-03-21 ENCOUNTER — Other Ambulatory Visit: Payer: Self-pay

## 2022-03-21 ENCOUNTER — Encounter: Payer: Self-pay | Admitting: Gastroenterology

## 2022-03-21 ENCOUNTER — Encounter
Admission: RE | Disposition: A | Discharge status: Home / Self Care | Payer: Self-pay | Source: Home / Self Care | Attending: Gastroenterology

## 2022-03-21 DIAGNOSIS — K5730 Diverticulosis of large intestine without perforation or abscess without bleeding: Secondary | ICD-10-CM | POA: Diagnosis not present

## 2022-03-21 DIAGNOSIS — G4730 Sleep apnea, unspecified: Secondary | ICD-10-CM | POA: Insufficient documentation

## 2022-03-21 DIAGNOSIS — I10 Essential (primary) hypertension: Secondary | ICD-10-CM | POA: Diagnosis not present

## 2022-03-21 DIAGNOSIS — K219 Gastro-esophageal reflux disease without esophagitis: Secondary | ICD-10-CM | POA: Diagnosis not present

## 2022-03-21 DIAGNOSIS — J45909 Unspecified asthma, uncomplicated: Secondary | ICD-10-CM | POA: Insufficient documentation

## 2022-03-21 DIAGNOSIS — F17210 Nicotine dependence, cigarettes, uncomplicated: Secondary | ICD-10-CM | POA: Insufficient documentation

## 2022-03-21 DIAGNOSIS — Z86010 Personal history of colonic polyps: Secondary | ICD-10-CM | POA: Diagnosis not present

## 2022-03-21 DIAGNOSIS — Z8673 Personal history of transient ischemic attack (TIA), and cerebral infarction without residual deficits: Secondary | ICD-10-CM | POA: Diagnosis not present

## 2022-03-21 DIAGNOSIS — I252 Old myocardial infarction: Secondary | ICD-10-CM | POA: Diagnosis not present

## 2022-03-21 DIAGNOSIS — Z1211 Encounter for screening for malignant neoplasm of colon: Secondary | ICD-10-CM | POA: Insufficient documentation

## 2022-03-21 DIAGNOSIS — I2510 Atherosclerotic heart disease of native coronary artery without angina pectoris: Secondary | ICD-10-CM | POA: Diagnosis not present

## 2022-03-21 DIAGNOSIS — Z7984 Long term (current) use of oral hypoglycemic drugs: Secondary | ICD-10-CM | POA: Insufficient documentation

## 2022-03-21 DIAGNOSIS — E119 Type 2 diabetes mellitus without complications: Secondary | ICD-10-CM | POA: Insufficient documentation

## 2022-03-21 DIAGNOSIS — Z8711 Personal history of peptic ulcer disease: Secondary | ICD-10-CM | POA: Insufficient documentation

## 2022-03-21 HISTORY — PX: COLONOSCOPY WITH PROPOFOL: SHX5780

## 2022-03-21 HISTORY — DX: Other chronic pain: G89.29

## 2022-03-21 HISTORY — DX: Sleep apnea, unspecified: G47.30

## 2022-03-21 HISTORY — DX: Unspecified osteoarthritis, unspecified site: M19.90

## 2022-03-21 LAB — GLUCOSE, CAPILLARY: Glucose-Capillary: 189 mg/dL — ABNORMAL HIGH (ref 70–99)

## 2022-03-21 SURGERY — COLONOSCOPY WITH PROPOFOL
Anesthesia: General

## 2022-03-21 MED ORDER — PROPOFOL 10 MG/ML IV BOLUS
INTRAVENOUS | Status: DC | PRN
Start: 2022-03-21 — End: 2022-03-21
  Administered 2022-03-21: 100 mg via INTRAVENOUS

## 2022-03-21 MED ORDER — DEXMEDETOMIDINE (PRECEDEX) IN NS 20 MCG/5ML (4 MCG/ML) IV SYRINGE
PREFILLED_SYRINGE | INTRAVENOUS | Status: DC | PRN
  Administered 2022-03-21: 12 ug via INTRAVENOUS

## 2022-03-21 MED ORDER — SODIUM CHLORIDE 0.9 % IV SOLN: INTRAVENOUS | Status: DC

## 2022-03-21 MED ORDER — MIDAZOLAM HCL 2 MG/2ML IJ SOLN
INTRAMUSCULAR | Status: DC | PRN
Start: 2022-03-21 — End: 2022-03-21
  Administered 2022-03-21: 2 mg via INTRAVENOUS

## 2022-03-21 MED ORDER — GLYCOPYRROLATE 0.2 MG/ML IJ SOLN
INTRAMUSCULAR | Status: DC | PRN
  Administered 2022-03-21: .1 mg via INTRAVENOUS

## 2022-03-21 MED ORDER — MIDAZOLAM HCL 2 MG/2ML IJ SOLN
INTRAMUSCULAR | Status: AC
  Filled 2022-03-21: qty 2

## 2022-03-21 MED ORDER — PROPOFOL 500 MG/50ML IV EMUL
INTRAVENOUS | Status: DC | PRN
Start: 2022-03-21 — End: 2022-03-21
  Administered 2022-03-21: 150 ug/kg/min via INTRAVENOUS

## 2022-03-21 NOTE — Transfer of Care (Signed)
Immediate Anesthesia Transfer of Care Note ? ?Patient: Caleb [name] ? ?Procedure(s) Performed: Procedure(s): ?COLONOSCOPY WITH PROPOFOL (N/A) ? ?Patient Location: PACU and Endoscopy Unit ? ?Anesthesia Type:General ? ?Level of Consciousness: sedated ? ?Airway & Oxygen Therapy: Patient Spontanous Breathing and Patient connected to nasal cannula oxygen ? ?Post-op Assessment: Report given to RN and Post -op Vital signs reviewed and stable ? ?Post vital signs: Reviewed and stable ? ?Last Vitals:  ?Vitals:  ? 03/21/22 1040 03/21/22 1045  ?BP: (!) 86/43 (!) 86/43  ?Pulse: 73 71  ?Resp: 15 15  ?Temp: (!) 36.2 ?C (!) 36.2 ?C  ?SpO2: 98% 95%  ? ? ?Complications: No apparent anesthesia complications ?

## 2022-03-21 NOTE — Anesthesia Procedure Notes (Signed)
Date/Time: 03/21/2022 10:19 AM ?Performed by: Doreen Salvage, CRNA ?Pre-anesthesia Checklist: Patient identified, Emergency Drugs available, Suction available and Patient being monitored ?Patient Re-evaluated:Patient Re-evaluated prior to induction ?Oxygen Delivery Method: Nasal cannula ?Induction Type: IV induction ?Dental Injury: Teeth and Oropharynx as per pre-operative assessment  ?Comments: Nasal cannula with etCO2 monitoring ? ? ? ? ?

## 2022-03-21 NOTE — Anesthesia Postprocedure Evaluation (Signed)
Anesthesia Post Note ? ?Patient: Caleb [name] ? ?Procedure(s) Performed: COLONOSCOPY WITH PROPOFOL ? ?Patient location during evaluation: Endoscopy ?Anesthesia Type: General ?Level of consciousness: awake and alert ?Pain management: pain level controlled ?Vital Signs Assessment: post-procedure vital signs reviewed and stable ?Respiratory status: spontaneous breathing, nonlabored ventilation, respiratory function stable and patient connected to nasal cannula oxygen ?Cardiovascular status: blood pressure returned to baseline and stable ?Postop Assessment: no apparent nausea or vomiting ?Anesthetic complications: no ? ? ?No notable events documented. ? ? ?Last Vitals:  ?Vitals:  ? 03/21/22 1100 03/21/22 1110  ?BP: 113/63 (!) 142/94  ?Pulse: 77 70  ?Resp: 18 19  ?Temp:    ?SpO2: 96% 96%  ?  ?Last Pain:  ?Vitals:  ? 03/21/22 1045  ?TempSrc: Temporal  ?PainSc:   ? ? ?  ?  ?  ?  ?  ?  ? ?[name] ? ? ? ? ?

## 2022-03-21 NOTE — Anesthesia Preprocedure Evaluation (Signed)
Anesthesia Evaluation  ?Patient identified by MRN, date of birth, ID band ?Patient awake ? ? ? ?Reviewed: ?Allergy & Precautions, NPO status , Patient's Chart, lab work & pertinent test results ? ?History of Anesthesia Complications ?Negative for: history of anesthetic complications ? ?Airway ?Mallampati: III ? ?TM Distance: <3 FB ?Neck ROM: full ? ? ? Dental ? ?(+) Chipped, Poor Dentition, Missing ?  ?Pulmonary ?asthma , sleep apnea , Current Smoker,  ?  ?Pulmonary exam normal ? ? ? ? ? ? ? Cardiovascular ?Exercise Tolerance: Good ?hypertension, (-) angina+ CAD and + Past MI  ?Normal cardiovascular exam ? ? ?  ?Neuro/Psych ? Headaches, PSYCHIATRIC DISORDERS  Neuromuscular disease CVA   ? GI/Hepatic ?Neg liver ROS, PUD, GERD  Controlled,  ?Endo/Other  ?diabetes, Type 2 ? Renal/GU ?Renal disease  ?negative genitourinary ?  ?Musculoskeletal ? ?(+) Arthritis ,  ? Abdominal ?  ?Peds ? Hematology ?negative hematology ROS ?(+)   ?Anesthesia Other Findings ?Past Medical History: ?No date: Anxiety ?No date: Diabetes mellitus without complication ([place]) ?No date: GERD (gastroesophageal reflux disease) ?No date: History of degenerative disc disease ?No date: Hypercholesteremia ?No date: Hypertension ?No date: Knee pain, bilateral ?No date: Myocardial infarct [facility]) ?No date: PTSD (post-traumatic stress disorder) ?No date: Pulmonary embolism ([place]) ?2015: Stroke ([place]) ?    Comment:  mold stroke per patient ? ?Past Surgical History: ?05/07/2017: COLONOSCOPY WITH PROPOFOL; N/A ?    Comment:  Procedure: COLONOSCOPY WITH PROPOFOL;  Surgeon:  ?             Lollie Sails, MD;  Location: [facility] ENDOSCOPY;   ?             Service: Endoscopy;  Laterality: N/A; ?04/11/2021: COLONOSCOPY WITH PROPOFOL; N/A ?    Comment:  Procedure: COLONOSCOPY WITH PROPOFOL;  Surgeon:  ?             Virgel Manifold, MD;  Location: [facility] ENDOSCOPY;   ?             Service: Endoscopy;  Laterality: N/A; ?05/07/2017:  ESOPHAGOGASTRODUODENOSCOPY (EGD) WITH PROPOFOL; N/A ?    Comment:  Procedure: ESOPHAGOGASTRODUODENOSCOPY (EGD) WITH  ?             PROPOFOL;  Surgeon: Lollie Sails, MD;  Location:  ?             [facility];  Service: Endoscopy;  Laterality: N/A; ?No date: HERNIA REPAIR ?No date: Knee surgeries ?No date: TONSILLECTOMY ? ? ? ? Reproductive/Obstetrics ?negative OB ROS ? ?  ? ? ? ? ? ? ? ? ? ? ? ? ? ?  ?  ? ? ? ? ? ? ? ? ?Anesthesia Physical ?Anesthesia Plan ? ?ASA: 3 ? ?Anesthesia Plan: General  ? ?Post-op Pain Management:   ? ?Induction: Intravenous ? ?PONV Risk Score and Plan: Propofol infusion and TIVA ? ?Airway Management Planned: Natural Airway and Nasal Cannula ? ?Additional Equipment:  ? ?Intra-op Plan:  ? ?Post-operative Plan:  ? ?Informed Consent: I have reviewed the patients History and Physical, chart, labs and discussed the procedure including the risks, benefits and alternatives for the proposed anesthesia with the patient or authorized representative who has indicated his/her understanding and acceptance.  ? ? ? ?Dental Advisory Given ? ?Plan Discussed with: Anesthesiologist, CRNA and Surgeon ? ?Anesthesia Plan Comments: (Patient consented for risks of anesthesia including but not limited to:  ?- adverse reactions to medications ?- risk of airway placement if required ?- damage to  eyes, teeth, lips or other oral mucosa ?- nerve damage due to positioning  ?- sore throat or hoarseness ?- Damage to heart, brain, nerves, lungs, other parts of body or loss of life ? ?Patient voiced understanding.)  ? ? ? ? ? ? ?Anesthesia Quick Evaluation ? ?

## 2022-03-21 NOTE — Op Note (Signed)
[facility] ?Gastroenterology ?Patient Name: Caleb [name] ?Procedure Date: 03/21/2022 10:12 AM ?MRN: [id number] ?Account #: [id number] ?Date of Birth: Mar 25, 1976 ?Admit Type: Outpatient ?Age: 46 ?Room: [facility] ENDO ROOM 3 ?Gender: Male ?Note Status: Finalized ?Instrument Name: Colonscope [id number] ?Procedure:             Colonoscopy ?Indications:           High risk colon cancer surveillance: Personal history  ?                       of colonic polyps, Surveillance: History of piecemeal  ?                       removal adenoma on last colonoscopy (< 3 yrs) ?Providers:             Lin Landsman MD, MD ?Referring MD:          Venida Jarvis (Referring MD) ?Medicines:             General Anesthesia ?Complications:         No immediate complications. Estimated blood loss: None. ?Procedure:             Pre-Anesthesia Assessment: ?                       - Prior to the procedure, a History and Physical was  ?                       performed, and patient medications and allergies were  ?                       reviewed. The patient is competent. The risks and  ?                       benefits of the procedure and the sedation options and  ?                       risks were discussed with the patient. All questions  ?                       were answered and informed consent was obtained.  ?                       Patient identification and proposed procedure were  ?                       verified by the physician, the nurse, the  ?                       anesthesiologist, the anesthetist and the technician  ?                       in the pre-procedure area in the procedure room in the  ?                       endoscopy suite. Mental Status Examination: alert and  ?                       oriented. Airway Examination: normal oropharyngeal  ?  airway and neck mobility. Respiratory Examination:  ?                       clear to auscultation. CV Examination: normal.  ?                        Prophylactic Antibiotics: The patient does not require  ?                       prophylactic antibiotics. Prior Anticoagulants: The  ?                       patient has taken no previous anticoagulant or  ?                       antiplatelet agents. ASA Grade Assessment: III - A  ?                       patient with severe systemic disease. After reviewing  ?                       the risks and benefits, the patient was deemed in  ?                       satisfactory condition to undergo the procedure. The  ?                       anesthesia plan was to use general anesthesia.  ?                       Immediately prior to administration of medications,  ?                       the patient was re-assessed for adequacy to receive  ?                       sedatives. The heart rate, respiratory rate, oxygen  ?                       saturations, blood pressure, adequacy of pulmonary  ?                       ventilation, and response to care were monitored  ?                       throughout the procedure. The physical status of the  ?                       patient was re-assessed after the procedure. ?                       After obtaining informed consent, the colonoscope was  ?                       passed under direct vision. Throughout the procedure,  ?                       the patient's blood pressure, pulse, and oxygen  ?  saturations were monitored continuously. The  ?                       Colonoscope was introduced through the anus and  ?                       advanced to the the cecum, identified by appendiceal  ?                       orifice and ileocecal valve. The colonoscopy was  ?                       performed without difficulty. The patient tolerated  ?                       the procedure well. The quality of the bowel  ?                       preparation was good. ?Findings: ?     The perianal and digital rectal examinations were normal. Pertinent  ?     negatives include normal  sphincter tone and no palpable rectal lesions. ?     Many diverticula were found in the recto-sigmoid colon and sigmoid colon. ?     The retroflexed view of the distal rectum and anal verge was normal and  ?     showed no anal or rectal abnormalities. ?     The exam was otherwise without abnormality. ?     The terminal ileum appeared normal. ?Impression:            - Diverticulosis in the recto-sigmoid colon and in the  ?                       sigmoid colon. ?                       - The distal rectum and anal verge are normal on  ?                       retroflexion view. ?                       - The examination was otherwise normal. ?                       - The examined portion of the ileum was normal. ?                       - No specimens collected. ?Recommendation:        - Discharge patient to home (with escort). ?                       - Resume previous diet today. ?                       - Continue present medications. ?                       - Repeat colonoscopy in 5 years for surveillance. ?Procedure Code(s):     --- Professional --- ?  G0105, Colorectal cancer screening; colonoscopy on  ?                       individual at high risk ?Diagnosis Code(s):     --- Professional --- ?                       Z86.010, Personal history of colonic polyps ?                       K57.30, Diverticulosis of large intestine without  ?                       perforation or abscess without bleeding ?CPT copyright 2019 American Medical Association. All rights reserved. ?The codes documented in this report are preliminary and upon coder review may  ?be revised to meet current compliance requirements. ?Dr. Ulyess Mort ?Sahar Ryback Raeanne Gathers MD, MD ?03/21/2022 10:43:20 AM ?This report has been signed electronically. ?Number of Addenda: 0 ?Note Initiated On: 03/21/2022 10:12 AM ?Scope Withdrawal Time: 0 hours 12 minutes 13 seconds  ?Total Procedure Duration: 0 hours 16 minutes 9 seconds  ?Estimated Blood Loss:   Estimated blood loss: none. ?     [facility] ?

## 2022-03-21 NOTE — H&P (Signed)
?Cephas Darby, MD ?[address]  ?[address]  ?[place],  [address]  ?Main: [phone]  ?Fax: [phone] ?Pager: [phone] ? ?Primary Care Physician:  Venida Jarvis, MD ?Primary Gastroenterologist:  Dr. Cephas Darby ? ?Pre-Procedure History & Physical: ?HPI:  Caleb [name] is a 46 y.o. male is here for an colonoscopy. ?  ?Past Medical History:  ?Diagnosis Date  ? Anxiety   ? Arthritis   ? Chronic pain   ? Diabetes mellitus without complication ([place])   ? GERD (gastroesophageal reflux disease)   ? History of degenerative disc disease   ? Hypercholesteremia   ? Hypertension   ? Knee pain, bilateral   ? Myocardial infarct [facility])   ? PTSD (post-traumatic stress disorder)   ? Pulmonary embolism ([place])   ? Sleep apnea   ? Stroke [facility]) 2015  ? mold stroke per patient  ? ? ?Past Surgical History:  ?Procedure Laterality Date  ? ANGIOPLASTY / STENTING FEMORAL    ? 2 stents  ? COLONOSCOPY WITH PROPOFOL N/A 05/07/2017  ? Procedure: COLONOSCOPY WITH PROPOFOL;  Surgeon: Lollie Sails, MD;  Location: [facility] ENDOSCOPY;  Service: Endoscopy;  Laterality: N/A;  ? COLONOSCOPY WITH PROPOFOL N/A 04/11/2021  ? Procedure: COLONOSCOPY WITH PROPOFOL;  Surgeon: Virgel Manifold, MD;  Location: [facility] ENDOSCOPY;  Service: Endoscopy;  Laterality: N/A;  ? ESOPHAGOGASTRODUODENOSCOPY (EGD) WITH PROPOFOL N/A 05/07/2017  ? Procedure: ESOPHAGOGASTRODUODENOSCOPY (EGD) WITH PROPOFOL;  Surgeon: Lollie Sails, MD;  Location: [facility] ENDOSCOPY;  Service: Endoscopy;  Laterality: N/A;  ? HERNIA REPAIR    ? Knee surgeries    ? TONSILLECTOMY    ? ? ?Prior to Admission medications   ?Medication Sig Start Date End Date Taking? Authorizing Provider  ?Accu-Chek Softclix Lancets lancets 1 each 3 (three) times daily. 01/17/22   [provider]  ?amLODipine (NORVASC) 5 MG tablet Take 5 mg by mouth daily. 12/17/20 02/10/22  [provider]  ?aspirin EC 81 MG tablet Take 81 mg by mouth daily.    [provider]  ?atorvastatin (LIPITOR) 80 MG tablet Take 80 mg by mouth daily. 06/05/21 06/05/22  [provider]  ?Blood Glucose Monitoring Suppl (ACCU-CHEK GUIDE ME) w/Device KIT See admin instructions. 01/17/22   [provider]  ?Blood Glucose Monitoring Suppl (FIFTY50 GLUCOSE METER 2.0) w/Device KIT Check once daily 10/28/21 10/28/22  [provider]  ?cetirizine (ZYRTEC) 10 MG tablet Take 10 mg by mouth daily.    [provider]  ?clopidogrel (PLAVIX) 75 MG tablet Take 75 mg by mouth daily. 06/14/20   [provider]  ?Cysteamine Bitartrate (PROCYSBI) 300 MG PACK Use 1 each 3 (three) times daily 01/17/22 01/17/23  [provider]  ?diphenhydrAMINE (BENADRYL) 25 mg capsule Take 50 mg by mouth every 6 (six) hours as needed for itching or allergies.    [provider]  ?EPINEPHrine 0.3 mg/0.3 mL IJ SOAJ injection Inject into the muscle. 09/24/19   [provider]  ?erythromycin ophthalmic ointment Place a 1/2 inch ribbon of ointment onto the upper eyelid margin. 01/24/22   Margarette Canada, NP  ?esomeprazole (NEXIUM) 20 MG capsule Take by mouth. ?Patient not taking: Reported on 02/10/2022    [provider]  ?ezetimibe (ZETIA) 10 MG tablet Take 10 mg by mouth daily. 01/18/22 01/18/23  [provider]  ?glimepiride (AMARYL) 2 MG tablet Take 2-4 mg by mouth 2 (two) times daily. 01/17/22   [provider]  ?glucose blood (FREESTYLE INSULINX TEST) test strip daily.  04/25/21   [provider]  ?isosorbide mononitrate (IMDUR) 30 MG 24 hr tablet Take 1 tablet (30 mg total) by mouth daily. 02/11/22 03/13/22  Wyvonnia Dusky, MD  ?Lancets (FREESTYLE) lancets daily. 04/25/21   [provider]  ?losartan (COZAAR) 100 MG tablet Take 100 mg by mouth daily. 01/18/22 01/18/23  [provider]  ?metoprolol succinate (TOPROL-XL) 50 MG 24 hr tablet Take 50 mg by mouth daily. 12/24/17   [provider]  ?MONTELUKAST SODIUM PO Take 1  tablet by mouth daily. ?Patient not taking: Reported on 07/12/2020    [provider]  ?nicotine (NICODERM CQ - DOSED IN MG/24 HOURS) 21 mg/24hr patch Place onto the skin. ?Patient not taking: Reported on 02/10/2022 01/16/22 04/10/22  [provider]  ?nicotine (NICODERM CQ - DOSED IN MG/24 HOURS) 21 mg/24hr patch Place onto the skin. 01/25/22 04/19/22  [provider]  ?nortriptyline (PAMELOR) 25 MG capsule Take 25 mg by mouth at bedtime. 10/25/21   [provider]  ?ranolazine (RANEXA) 500 MG 12 hr tablet Take 500 mg by mouth 2 (two) times daily. ?Patient not taking: Reported on 02/10/2022 01/17/22 01/17/23  [provider]  ? ? ?Allergies as of 03/01/2022 - Review Complete 02/28/2022  ?Allergen Reaction Noted  ? Bee venom Anaphylaxis 05/21/2013  ? Iodinated contrast media Anaphylaxis and Swelling 04/30/2015  ? Metformin and related Anaphylaxis 04/30/2015  ? Lisinopril Swelling 01/24/2017  ? ? ?Family History  ?Problem Relation Age of Onset  ? Diabetes Mother   ? Cancer Paternal Grandmother   ? Cancer Paternal Grandfather   ? Heart attack Maternal Grandfather 50  ? ? ?Social History  ? ?Socioeconomic History  ? Marital status: Married  ?  Spouse name: Not on file  ? Number of children: Not on file  ? Years of education: Not on file  ? Highest education level: Not on file  ?Occupational History  ? Not on file  ?Tobacco Use  ? Smoking status: Every Day  ?  Packs/day: 1.00  ?  Years: 20.00  ?  Pack years: 20.00  ?  Types: Cigarettes  ? Smokeless tobacco: Never  ?Vaping Use  ? Vaping Use: Never used  ?Substance and Sexual Activity  ? Alcohol use: Yes  ?  Alcohol/week: 4.0 standard drinks  ?  Types: 4 Cans of beer per week  ?  Comment: occas.   ? Drug use: No  ? Sexual activity: Yes  ?Other Topics Concern  ? Not on file  ?Social History Narrative  ? Not on file  ? ?Social Determinants of Health  ? ?Financial Resource Strain: Not on file  ?Food Insecurity: Not on file  ?Transportation  Needs: Not on file  ?Physical Activity: Not on file  ?Stress: Not on file  ?Social Connections: Not on file  ?Intimate Partner Violence: Not on file  ? ? ?Review of Systems: ?See HPI, otherwise negative ROS ? ?Physical Exam: ?BP (!) 133/93   Pulse 72   Temp (!) 97.2 ?F (36.2 ?C) (Temporal)   Resp 18   Ht 6' (1.829 m)   Wt 104.3 kg   SpO2 98%   BMI 31.19 kg/m?  ?General:   Alert,  pleasant and cooperative in NAD ?Head:  Normocephalic and atraumatic. ?Neck:  Supple; no masses or thyromegaly. ?Lungs:  Clear throughout to auscultation.    ?Heart:  Regular rate and rhythm. ?Abdomen:  Soft, nontender and nondistended. Normal bowel sounds, without guarding, and without rebound.   ?Neurologic:  Alert  and  oriented x4;  grossly normal neurologically. ? ?Impression/Plan: ?Caleb [name] is here for an colonoscopy to be performed for h/o colon adenomas ? ?Risks, benefits, limitations, and alternatives regarding  colonoscopy have been reviewed with the patient.  Questions have been answered.  All parties agreeable. ? ? ?Sherri Sear, MD  03/21/2022, 10:14 AM ?

## 2022-03-22 ENCOUNTER — Encounter: Payer: Self-pay | Admitting: Gastroenterology

## 2022-04-24 ENCOUNTER — Other Ambulatory Visit: Payer: Self-pay

## 2022-04-24 ENCOUNTER — Ambulatory Visit
Admission: EM | Admit: 2022-04-24 | Discharge: 2022-04-24 | Disposition: A | Discharge status: Home / Self Care | Payer: Medicare HMO

## 2022-04-24 DIAGNOSIS — L03213 Periorbital cellulitis: Secondary | ICD-10-CM | POA: Diagnosis not present

## 2022-04-24 DIAGNOSIS — H1033 Unspecified acute conjunctivitis, bilateral: Secondary | ICD-10-CM | POA: Diagnosis not present

## 2022-04-24 MED ORDER — AMOXICILLIN-POT CLAVULANATE 875-125 MG PO TABS: 1.0000 | ORAL_TABLET | Freq: Two times a day (BID) | ORAL | 0 refills | Status: AC

## 2022-04-24 MED ORDER — CIPROFLOXACIN HCL 0.3 % OP SOLN: OPHTHALMIC | 0 refills | Status: AC

## 2022-04-24 NOTE — ED Triage Notes (Signed)
Pt states sx started on Saturday. Peri orbital swelling, eye is sore and has been draining.  VISUAL ACUITY Right eye uncorrected 20/30 Left eye uncorrected 20/25

## 2022-04-24 NOTE — ED Provider Notes (Signed)
[facility]    CSN: [id number] Arrival date & time: 04/24/22  1154      History   Chief Complaint Chief Complaint  Patient presents with   Eye Problem    HPI Caleb [name] is a 46 y.o. male presenting for left lower eyelid swelling and redness for the past 2 days.  Patient also reports yellowish-green drainage from bilateral eyes but more so on the left side.  Reports similar symptoms as to when he had infection of his tear duct and eye about 2 months ago.  Patient reports the swollen area ruptured at that time and pus came out.  Used erythromycin ointment at that time.  He is reporting periorbital pain.  Denies pain with movement of the eye or change in vision.  Patient does not wear contacts or glasses.  Has tried over-the-counter redness relief eyedrops without improvement in symptoms.  He has significant medical history for diabetes, hypertension, hyperlipidemia and previous stroke and MI.  Also history of PE, chronic pain, anxiety, and sleep apnea.  HPI  Past Medical History:  Diagnosis Date   Anxiety    Arthritis    Chronic pain    Diabetes mellitus without complication (HCC)    GERD (gastroesophageal reflux disease)    History of degenerative disc disease    Hypercholesteremia    Hypertension    Knee pain, bilateral    Myocardial infarct [facility])    PTSD (post-traumatic stress disorder)    Pulmonary embolism ([place])    Sleep apnea    Stroke ([place]) 2015   mold stroke per patient    Patient Active Problem List   Diagnosis Date Noted   PTSD (post-traumatic stress disorder) 01/24/2022   History of colonic polyps    Polyp of colon    Diabetic polyneuropathy associated with type 2 diabetes mellitus ([place]) 03/03/2021   Chronic pain syndrome 12/16/2020   History of alcohol abuse 09/30/2019   Coronary artery disease involving native coronary artery of native heart 09/23/2019   S/P drug eluting coronary stent placement 09/23/2019   Steroid-induced  hyperglycemia 09/23/2019   Chronic, continuous use of opioids 09/22/2019   Heavy alcohol use 09/22/2019   Hyperglycemia 09/22/2019   Memory difficulties 09/22/2019   Psychophysiological insomnia 03/06/2019   Leukocytosis 07/01/2018   Chest pain 06/30/2018   Hypertensive urgency 06/30/2018   Hypokalemia 06/30/2018   Fall 06/30/2018   Anxiety    Hypercholesteremia    Stroke-like symptom 10/16/2017   Gastritis, erosive 05/21/2017   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) 03/18/2017   Vitamin D deficiency 03/18/2017   LVH (left ventricular hypertrophy) due to hypertensive disease, without heart failure 01/29/2017   Bee sting allergy 06/13/2016   Chronic midline low back pain without sciatica 06/13/2016   Kidney stone 06/12/2016   History of colonic diverticulitis 03/07/2016   Diverticulitis 02/26/2016   Diverticulitis of large intestine without perforation or abscess without bleeding    Hemiplegic migraine without status migrainosus, 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/21/2015   OSA (obstructive sleep apnea) 06/21/2015   Dental caries, unspecified 06/24/2014   Stroke ([place]) 62/83/1517   Alcoholic liver disease, unspecified ([place]) 05/21/2013   Allergy to radiographic contrast media 05/21/2013   Chronic liver disease 05/21/2013   High risk medications (not anticoagulants) long-term use 02/27/2013   BMI 32.0-32.9,adult 12/12/2012   Morbid obesity ([place]) 12/12/2012   Personal history of combat and operational stress reaction 02/22/2012  Primary localized osteoarthrosis, lower leg 12/04/2011   Health care maintenance 07/26/2010   Nicotine dependence, cigarettes, uncomplicated 16/08/9603   Asthma 03/24/2009   History of pulmonary embolus (PE) 03/24/2009   Essential hypertension 01/12/2009   Gastroesophageal reflux disease without esophagitis 06/22/2004    Past Surgical History:  Procedure  Laterality Date   ANGIOPLASTY / STENTING FEMORAL     2 stents   COLONOSCOPY WITH PROPOFOL N/A 05/07/2017   Procedure: COLONOSCOPY WITH PROPOFOL;  Surgeon: Lollie Sails, MD;  Location: [facility] ENDOSCOPY;  Service: Endoscopy;  Laterality: N/A;   COLONOSCOPY WITH PROPOFOL N/A 04/11/2021   Procedure: COLONOSCOPY WITH PROPOFOL;  Surgeon: Virgel Manifold, MD;  Location: [facility] ENDOSCOPY;  Service: Endoscopy;  Laterality: N/A;   COLONOSCOPY WITH PROPOFOL N/A 03/21/2022   Procedure: COLONOSCOPY WITH PROPOFOL;  Surgeon: Lin Landsman, MD;  Location: [facility] ENDOSCOPY;  Service: Gastroenterology;  Laterality: N/A;   ESOPHAGOGASTRODUODENOSCOPY (EGD) WITH PROPOFOL N/A 05/07/2017   Procedure: ESOPHAGOGASTRODUODENOSCOPY (EGD) WITH PROPOFOL;  Surgeon: Lollie Sails, MD;  Location: [facility] ENDOSCOPY;  Service: Endoscopy;  Laterality: N/A;   HERNIA REPAIR     Knee surgeries     TONSILLECTOMY         Home Medications    Prior to Admission medications   Medication Sig Start Date End Date Taking? Authorizing Provider  amoxicillin-clavulanate (AUGMENTIN) 875-125 MG tablet Take 1 tablet by mouth every 12 (twelve) hours for 7 days. 04/24/22 05/01/22 Yes Danton Clap, PA-C  ciprofloxacin (CILOXAN) 0.3 % ophthalmic solution Administer 1 drop, every 2 hours, while awake, for 2 days. Then 1 drop, every 4 hours, while awake, for the next 5 days. 04/24/22 05/01/22 Yes Laurene Footman B, PA-C  diazepam (VALIUM) 5 MG tablet Take by mouth. 03/20/22  Yes [provider]  losartan-hydrochlorothiazide (HYZAAR) 100-25 MG tablet Take 1 tablet by mouth daily. 03/20/22 03/20/23 Yes [provider]  oxyCODONE (ROXICODONE) 15 MG immediate release tablet Take by mouth. 03/20/22 05/19/22 Yes [provider]  Accu-Chek Softclix Lancets lancets 1 each 3 (three) times daily. 01/17/22   [provider]  amLODipine (NORVASC) 5 MG tablet Take 5 mg by mouth daily. 12/17/20 02/10/22  [provider]   aspirin EC 81 MG tablet Take 81 mg by mouth daily.    [provider]  atorvastatin (LIPITOR) 80 MG tablet Take 80 mg by mouth daily. 06/05/21 06/05/22  [provider]  Blood Glucose Monitoring Suppl (ACCU-CHEK GUIDE ME) w/Device KIT See admin instructions. 01/17/22   [provider]  Blood Glucose Monitoring Suppl (FIFTY50 GLUCOSE METER 2.0) w/Device KIT Check once daily 10/28/21 10/28/22  [provider]  cetirizine (ZYRTEC) 10 MG tablet Take 10 mg by mouth daily.    [provider]  clopidogrel (PLAVIX) 75 MG tablet Take 75 mg by mouth daily. 06/14/20   [provider]  Cysteamine Bitartrate (PROCYSBI) 300 MG PACK Use 1 each 3 (three) times daily 01/17/22 01/17/23  [provider]  diphenhydrAMINE (BENADRYL) 25 mg capsule Take 50 mg by mouth every 6 (six) hours as needed for itching or allergies.    [provider]  EPINEPHrine 0.3 mg/0.3 mL IJ SOAJ injection Inject into the muscle. 09/24/19   [provider]  erythromycin ophthalmic ointment Place a 1/2 inch ribbon of ointment onto the upper eyelid margin. 01/24/22   Margarette Canada, NP  esomeprazole (NEXIUM) 20 MG capsule Take by mouth.    [provider]  ezetimibe (ZETIA) 10 MG tablet Take 10 mg by  mouth daily. 01/18/22 01/18/23  [provider]  glimepiride (AMARYL) 2 MG tablet Take 2-4 mg by mouth 2 (two) times daily. 01/17/22   [provider]  glucose blood (FREESTYLE INSULINX TEST) test strip daily. 04/25/21   [provider]  isosorbide mononitrate (IMDUR) 30 MG 24 hr tablet Take 1 tablet (30 mg total) by mouth daily. 02/11/22 03/13/22  Wyvonnia Dusky, MD  Lancets (FREESTYLE) lancets daily. 04/25/21   [provider]  losartan (COZAAR) 100 MG tablet Take 100 mg by mouth daily. 01/18/22 01/18/23  [provider]  metoprolol succinate (TOPROL-XL) 50 MG 24 hr tablet Take 50 mg by mouth daily. 12/24/17   [provider]   MONTELUKAST SODIUM PO Take 1 tablet by mouth daily. Patient not taking: Reported on 07/12/2020    [provider]  nortriptyline (PAMELOR) 25 MG capsule Take 25 mg by mouth at bedtime. 10/25/21   [provider]  ranolazine (RANEXA) 500 MG 12 hr tablet Take 500 mg by mouth 2 (two) times daily. Patient not taking: Reported on 02/10/2022 01/17/22 01/17/23  [provider]    Family History Family History  Problem Relation Age of Onset   Diabetes Mother    Cancer Paternal Grandmother    Cancer Paternal Grandfather    Heart attack Maternal Grandfather 45    Social History Social History   Tobacco Use   Smoking status: Former    Packs/day: 1.00    Years: 20.00    Pack years: 20.00    Types: Cigarettes    Quit date: 04/21/2022   Smokeless tobacco: Never  Vaping Use   Vaping Use: Never used  Substance Use Topics   Alcohol use: Yes    Alcohol/week: 4.0 standard drinks    Types: 4 Cans of beer per week    Comment: occas.    Drug use: Not Currently     Allergies   Bee venom, Iodinated contrast media, Metformin and related, and Lisinopril   Review of Systems Review of Systems  Constitutional:  Negative for fatigue and fever.  HENT:  Positive for facial swelling. Negative for congestion.   Eyes:  Positive for discharge, redness and itching. Negative for photophobia and visual disturbance.  Respiratory:  Negative for cough.   Neurological:  Negative for dizziness and headaches.    Physical Exam Triage Vital Signs ED Triage Vitals  Enc Vitals Group     BP 04/24/22 1214 (!) 143/65     Pulse Rate 04/24/22 1214 85     Resp 04/24/22 1214 19     Temp 04/24/22 1214 98.3 F (36.8 C)     Temp Source 04/24/22 1214 Oral     SpO2 04/24/22 1214 97 %     Weight 04/24/22 1209 240 lb (108.9 kg)     Height 04/24/22 1209 6' (1.829 m)     Head Circumference --      Peak Flow --      Pain Score 04/24/22 1209 2     Pain Loc --      Pain Edu? --      Excl. in  [place]? --    No data found.  Updated Vital Signs BP (!) 143/65 (BP Location: Left Arm)   Pulse 85   Temp 98.3 F (36.8 C) (Oral)   Resp 19   Ht 6' (1.829 m)   Wt 240 lb (108.9 kg)   SpO2 97%   BMI 32.55 kg/m   Visual Acuity Right Eye Distance:  Left Eye Distance:   Bilateral Distance:    Physical Exam Vitals and nursing note reviewed.  Constitutional:      General: He is not in acute distress.    Appearance: Normal appearance. He is well-developed. He is not ill-appearing.  HENT:     Head: Normocephalic and atraumatic.     Nose: Nose normal.     Mouth/Throat:     Mouth: Mucous membranes are moist.     Pharynx: Oropharynx is clear.  Eyes:     General: Lids are everted, no foreign bodies appreciated. No scleral icterus.       Right eye: Discharge (thick yellow drainage) present.        Left eye: Discharge (thick yellow drainage) present.    Extraocular Movements: Extraocular movements intact.     Conjunctiva/sclera:     Right eye: Right conjunctiva is injected.     Left eye: Left conjunctiva is injected.     Pupils: Pupils are equal, round, and reactive to light.     Comments: Moderate swelling of the left lower eyelid.  Tenderness to this area with associated erythema and increased warmth.  Cardiovascular:     Rate and Rhythm: Normal rate and regular rhythm.  Pulmonary:     Effort: Pulmonary effort is normal. No respiratory distress.     Breath sounds: Normal breath sounds.  Musculoskeletal:     Cervical back: Neck supple.  Skin:    General: Skin is warm and dry.     Capillary Refill: Capillary refill takes less than 2 seconds.  Neurological:     General: No focal deficit present.     Mental Status: He is alert. Mental status is at baseline.     Motor: No weakness.  Psychiatric:        Mood and Affect: Mood normal.        Behavior: Behavior normal.        Thought Content: Thought content normal.      UC Treatments / Results  Labs (all labs ordered are  listed, but only abnormal results are displayed) Labs Reviewed - No data to display  EKG   Radiology No results found.  Procedures Procedures (including critical care time)  Medications Ordered in UC Medications - No data to display  Initial Impression / Assessment and Plan / UC Course  I have reviewed the triage vital signs and the nursing notes.  Pertinent labs & imaging results that were available during my care of the patient were reviewed by me and considered in my medical decision making (see chart for details).  47 year old male presenting for 2-day history of left lower eyelid redness, swelling as well as conjunctival erythema and yellowish-green drainage from both eyes but most significantly on the left side.  No associated fever, vision changes.  Does not wear contacts or glasses.  Reporting no injury.  Today he is afebrile and overall well-appearing.  Have included a photo in his chart.  He does have moderate swelling of the left lower eyelid with associated erythema and this entire area is tender to palpation.  It is mostly soft.  Conjunctival injection bilaterally. Thick yellow/green drainage bilaterally.  Vision intact.  We will treat patient at this time for conjunctivitis as well as possible early periorbital cellulitis.  Sent Ciloxan and Augmentin.  Reviewed use of warm compresses and close monitoring.  ED precautions thoroughly reviewed with patient.   Final Clinical Impressions(s) / UC Diagnoses   Final diagnoses:  Periorbital cellulitis of left  eye  Acute bacterial conjunctivitis of both eyes     Discharge Instructions      -You have an infected inflamed tear duct as well as conjunctivitis of both your eyes.  I have sent an eyedrop that contains antibiotic to pharmacy as well as an oral antibiotic.  I also want you to start performing warm compresses on the eye every couple of hours for 15 minutes at a time. - If you develop a fever or increased swelling or  pain, you need to go to the ER.  If no improvement in the next 2 to 3 days you should be reevaluated. - Consider following up with [facility] as well if you are having these recurrent issues.     ED Prescriptions     Medication Sig Dispense Auth. Provider   ciprofloxacin (CILOXAN) 0.3 % ophthalmic solution Administer 1 drop, every 2 hours, while awake, for 2 days. Then 1 drop, every 4 hours, while awake, for the next 5 days. 5 mL Laurene Footman B, PA-C   amoxicillin-clavulanate (AUGMENTIN) 875-125 MG tablet Take 1 tablet by mouth every 12 (twelve) hours for 7 days. 14 tablet Gretta Cool      PDMP not reviewed this encounter.   Danton Clap, PA-C 04/24/22 1305

## 2022-04-24 NOTE — Discharge Instructions (Signed)
-  You have an infected inflamed tear duct as well as conjunctivitis of both your eyes.  I have sent an eyedrop that contains antibiotic to pharmacy as well as an oral antibiotic.  I also want you to start performing warm compresses on the eye every couple of hours for 15 minutes at a time. - If you develop a fever or increased swelling or pain, you need to go to the ER.  If no improvement in the next 2 to 3 days you should be reevaluated. - Consider following up with [facility] as well if you are having these recurrent issues.

## 2022-05-08 ENCOUNTER — Emergency Department: Payer: Medicare HMO

## 2022-05-08 ENCOUNTER — Observation Stay
Admission: EM | Admit: 2022-05-08 | Discharge: 2022-05-10 | Disposition: A | Discharge status: Home / Self Care | Payer: Medicare HMO | Attending: Internal Medicine | Admitting: Internal Medicine

## 2022-05-08 ENCOUNTER — Encounter: Payer: Self-pay | Admitting: *Deleted

## 2022-05-08 ENCOUNTER — Other Ambulatory Visit: Payer: Self-pay

## 2022-05-08 DIAGNOSIS — Z8673 Personal history of transient ischemic attack (TIA), and cerebral infarction without residual deficits: Secondary | ICD-10-CM | POA: Diagnosis not present

## 2022-05-08 DIAGNOSIS — E7800 Pure hypercholesterolemia, unspecified: Secondary | ICD-10-CM | POA: Diagnosis present

## 2022-05-08 DIAGNOSIS — F17210 Nicotine dependence, cigarettes, uncomplicated: Secondary | ICD-10-CM | POA: Diagnosis not present

## 2022-05-08 DIAGNOSIS — F4310 Post-traumatic stress disorder, unspecified: Secondary | ICD-10-CM | POA: Diagnosis present

## 2022-05-08 DIAGNOSIS — G4733 Obstructive sleep apnea (adult) (pediatric): Secondary | ICD-10-CM | POA: Diagnosis not present

## 2022-05-08 DIAGNOSIS — E876 Hypokalemia: Secondary | ICD-10-CM | POA: Diagnosis not present

## 2022-05-08 DIAGNOSIS — I2510 Atherosclerotic heart disease of native coronary artery without angina pectoris: Secondary | ICD-10-CM | POA: Insufficient documentation

## 2022-05-08 DIAGNOSIS — Z8679 Personal history of other diseases of the circulatory system: Secondary | ICD-10-CM

## 2022-05-08 DIAGNOSIS — Z7984 Long term (current) use of oral hypoglycemic drugs: Secondary | ICD-10-CM | POA: Diagnosis not present

## 2022-05-08 DIAGNOSIS — I10 Essential (primary) hypertension: Secondary | ICD-10-CM | POA: Diagnosis not present

## 2022-05-08 DIAGNOSIS — Z7982 Long term (current) use of aspirin: Secondary | ICD-10-CM | POA: Insufficient documentation

## 2022-05-08 DIAGNOSIS — E1142 Type 2 diabetes mellitus with diabetic polyneuropathy: Secondary | ICD-10-CM

## 2022-05-08 DIAGNOSIS — R079 Chest pain, unspecified: Secondary | ICD-10-CM | POA: Diagnosis not present

## 2022-05-08 DIAGNOSIS — Z79899 Other long term (current) drug therapy: Secondary | ICD-10-CM | POA: Diagnosis not present

## 2022-05-08 DIAGNOSIS — R0789 Other chest pain: Principal | ICD-10-CM | POA: Insufficient documentation

## 2022-05-08 DIAGNOSIS — Z7902 Long term (current) use of antithrombotics/antiplatelets: Secondary | ICD-10-CM | POA: Diagnosis not present

## 2022-05-08 DIAGNOSIS — R739 Hyperglycemia, unspecified: Secondary | ICD-10-CM

## 2022-05-08 DIAGNOSIS — E1165 Type 2 diabetes mellitus with hyperglycemia: Secondary | ICD-10-CM | POA: Diagnosis not present

## 2022-05-08 DIAGNOSIS — Z86711 Personal history of pulmonary embolism: Secondary | ICD-10-CM | POA: Insufficient documentation

## 2022-05-08 LAB — BASIC METABOLIC PANEL
Anion gap: 9 (ref 5–15)
BUN: 11 mg/dL (ref 6–20)
CO2: 23 mmol/L (ref 22–32)
Calcium: 9 mg/dL (ref 8.9–10.3)
Chloride: 105 mmol/L (ref 98–111)
Creatinine, Ser: 0.62 mg/dL (ref 0.61–1.24)
GFR, Estimated: >60 mL/min (ref >60)
Glucose, Bld: 293 mg/dL — ABNORMAL HIGH (ref 70–99)
Potassium: 3.2 mmol/L — ABNORMAL LOW (ref 3.5–5.1)
Sodium: 137 mmol/L (ref 135–145)

## 2022-05-08 LAB — CBC
HCT: 43.8 % (ref 39.0–52.0)
Hemoglobin: 16.1 g/dL (ref 13.0–17.0)
MCH: 31.9 pg (ref 26.0–34.0)
MCHC: 36.8 g/dL — ABNORMAL HIGH (ref 30.0–36.0)
MCV: 86.7 fL (ref 80.0–100.0)
Platelets: 240 10*3/uL (ref 150–400)
RBC: 5.05 MIL/uL (ref 4.22–5.81)
RDW: 11.7 % (ref 11.5–15.5)
WBC: 8.4 10*3/uL (ref 4.0–10.5)
nRBC: 0 % (ref 0.0–0.2)

## 2022-05-08 LAB — MAGNESIUM: Magnesium: 1.9 mg/dL (ref 1.7–2.4)

## 2022-05-08 LAB — TROPONIN I (HIGH SENSITIVITY)
Troponin I (High Sensitivity): 6 ng/L (ref <18)
Troponin I (High Sensitivity): 7 ng/L (ref <18)

## 2022-05-08 LAB — D-DIMER, QUANTITATIVE: D-Dimer, Quant: 0.45 ug/mL-FEU (ref 0.00–0.50)

## 2022-05-08 MED ORDER — LORAZEPAM 2 MG/ML IJ SOLN: 2.0000 mg | INTRAMUSCULAR | Status: DC | PRN

## 2022-05-08 MED ORDER — ONDANSETRON HCL 4 MG/2ML IJ SOLN: 4.0000 mg | Freq: Four times a day (QID) | INTRAMUSCULAR | Status: DC | PRN

## 2022-05-08 MED ORDER — ONDANSETRON HCL 4 MG/2ML IJ SOLN
4.0000 mg | Freq: Once | INTRAMUSCULAR | Status: AC
  Administered 2022-05-08: 4 mg via INTRAVENOUS
  Filled 2022-05-08: qty 2

## 2022-05-08 MED ORDER — NITROGLYCERIN 0.4 MG SL SUBL
0.4000 mg | SUBLINGUAL_TABLET | SUBLINGUAL | Status: DC | PRN
  Administered 2022-05-08: 0.4 mg via SUBLINGUAL
  Filled 2022-05-08 (×2): qty 1

## 2022-05-08 MED ORDER — HEPARIN BOLUS VIA INFUSION
4000.0000 [IU] | Freq: Once | INTRAVENOUS | Status: AC
  Administered 2022-05-08: 4000 [IU] via INTRAVENOUS
  Filled 2022-05-08: qty 4000

## 2022-05-08 MED ORDER — ALUM & MAG HYDROXIDE-SIMETH 200-200-20 MG/5ML PO SUSP: 30.0000 mL | Freq: Four times a day (QID) | ORAL | Status: DC | PRN

## 2022-05-08 MED ORDER — HEPARIN (PORCINE) 25000 UT/250ML-% IV SOLN
1550.0000 [IU]/h | INTRAVENOUS | Status: DC
  Administered 2022-05-08: 1350 [IU]/h via INTRAVENOUS
  Filled 2022-05-08: qty 250

## 2022-05-08 MED ORDER — LIDOCAINE VISCOUS HCL 2 % MT SOLN: 15.0000 mL | Freq: Four times a day (QID) | OROMUCOSAL | Status: DC | PRN

## 2022-05-08 MED ORDER — FENTANYL CITRATE PF 50 MCG/ML IJ SOSY
50.0000 ug | PREFILLED_SYRINGE | Freq: Once | INTRAMUSCULAR | Status: AC
  Administered 2022-05-08: 50 ug via INTRAVENOUS
  Filled 2022-05-08: qty 1

## 2022-05-08 MED ORDER — NITROGLYCERIN 2 % TD OINT: 1.0000 [in_us] | TOPICAL_OINTMENT | Freq: Four times a day (QID) | TRANSDERMAL | Status: DC | PRN

## 2022-05-08 MED ORDER — OXYCODONE HCL 5 MG PO TABS
15.0000 mg | ORAL_TABLET | ORAL | Status: AC
  Administered 2022-05-08: 15 mg via ORAL
  Filled 2022-05-08: qty 3

## 2022-05-08 MED ORDER — ENOXAPARIN SODIUM 40 MG/0.4ML IJ SOSY: 40.0000 mg | PREFILLED_SYRINGE | INTRAMUSCULAR | Status: DC

## 2022-05-08 MED ORDER — ISOSORBIDE MONONITRATE ER 60 MG PO TB24
60.0000 mg | ORAL_TABLET | Freq: Every day | ORAL | Status: DC
  Administered 2022-05-09 – 2022-05-10 (×2): 60 mg via ORAL
  Filled 2022-05-08 (×2): qty 1

## 2022-05-08 MED ORDER — OXYCODONE HCL 5 MG PO TABS
15.0000 mg | ORAL_TABLET | ORAL | Status: DC | PRN
  Administered 2022-05-09 – 2022-05-10 (×6): 15 mg via ORAL
  Filled 2022-05-08 (×6): qty 3

## 2022-05-08 MED ORDER — RANOLAZINE ER 500 MG PO TB12: 500.0000 mg | ORAL_TABLET | Freq: Two times a day (BID) | ORAL | Status: DC

## 2022-05-08 MED ORDER — ALUM & MAG HYDROXIDE-SIMETH 200-200-20 MG/5ML PO SUSP
30.0000 mL | Freq: Once | ORAL | Status: AC
  Administered 2022-05-08: 30 mL via ORAL
  Filled 2022-05-08: qty 30

## 2022-05-08 MED ORDER — NORTRIPTYLINE HCL 25 MG PO CAPS
25.0000 mg | ORAL_CAPSULE | Freq: Every day | ORAL | Status: DC
  Administered 2022-05-08 – 2022-05-09 (×2): 25 mg via ORAL
  Filled 2022-05-08 (×2): qty 1

## 2022-05-08 MED ORDER — POTASSIUM CHLORIDE 10 MEQ/100ML IV SOLN
10.0000 meq | INTRAVENOUS | Status: DC
  Filled 2022-05-08: qty 100

## 2022-05-08 MED ORDER — ATORVASTATIN CALCIUM 80 MG PO TABS
80.0000 mg | ORAL_TABLET | Freq: Every day | ORAL | Status: DC
  Administered 2022-05-09 – 2022-05-10 (×2): 80 mg via ORAL
  Filled 2022-05-08: qty 4
  Filled 2022-05-08: qty 1

## 2022-05-08 MED ORDER — LACTATED RINGERS IV BOLUS
1000.0000 mL | Freq: Once | INTRAVENOUS | Status: AC
  Administered 2022-05-08: 1000 mL via INTRAVENOUS

## 2022-05-08 MED ORDER — ACETAMINOPHEN 325 MG PO TABS
650.0000 mg | ORAL_TABLET | ORAL | Status: DC | PRN
Start: 2022-05-08 — End: 2022-05-10
  Administered 2022-05-09 – 2022-05-10 (×2): 650 mg via ORAL
  Filled 2022-05-08 (×2): qty 2

## 2022-05-08 MED ORDER — POTASSIUM CHLORIDE CRYS ER 20 MEQ PO TBCR
40.0000 meq | EXTENDED_RELEASE_TABLET | Freq: Once | ORAL | Status: AC
Start: 2022-05-08 — End: 2022-05-08
  Administered 2022-05-08: 40 meq via ORAL
  Filled 2022-05-08: qty 2

## 2022-05-08 MED ORDER — NICOTINE 21 MG/24HR TD PT24: 21.0000 mg | MEDICATED_PATCH | Freq: Every day | TRANSDERMAL | Status: DC | PRN

## 2022-05-08 MED ORDER — RANOLAZINE ER 500 MG PO TB12
500.0000 mg | ORAL_TABLET | Freq: Two times a day (BID) | ORAL | Status: DC
  Administered 2022-05-08 – 2022-05-10 (×4): 500 mg via ORAL
  Filled 2022-05-08 (×4): qty 1

## 2022-05-08 MED ORDER — SUCRALFATE 1 G PO TABS
1.0000 g | ORAL_TABLET | Freq: Once | ORAL | Status: AC
  Administered 2022-05-08: 1 g via ORAL
  Filled 2022-05-08: qty 1

## 2022-05-08 MED ORDER — PANTOPRAZOLE SODIUM 40 MG IV SOLR
40.0000 mg | Freq: Once | INTRAVENOUS | Status: AC
  Administered 2022-05-08: 40 mg via INTRAVENOUS
  Filled 2022-05-08: qty 10

## 2022-05-08 MED ORDER — MORPHINE SULFATE (PF) 4 MG/ML IV SOLN: 4.0000 mg | INTRAVENOUS | Status: DC | PRN

## 2022-05-08 MED ORDER — ASPIRIN 81 MG PO TBEC
81.0000 mg | DELAYED_RELEASE_TABLET | Freq: Every day | ORAL | Status: DC
  Administered 2022-05-09 – 2022-05-10 (×2): 81 mg via ORAL
  Filled 2022-05-08 (×2): qty 1

## 2022-05-08 NOTE — H&P (Signed)
History and Physical   Caleb [name] INO:[id number] DOB: Oct 27, 1976 DOA: 05/08/2022  PCP: Venida Jarvis, MD  Outpatient Specialists:  Patient coming from: home via EMS  I have personally briefly reviewed patient's old medical records in [place].  Chief Concern: chest pain  HPI: [name] is a 46 year old male with history of hypertension, chronic pain syndrome, currently has a pain contract, hyperlipidemia, CAD and NSTEMI status post DES to LAD in 09/2019, history of provoked DVT after knee surgery, continued tobacco user, heavy alcohol use, chronic opioid use, PTSD, who presents emergency department for chief concerns of chest pain.  Initial vitals in the emergency department showed temperature of 98.5, respiration rate of 17, heart rate of 128 and improved to 59, blood pressure 138/100, SPO2 of 97% on room air.  Serum sodium is 137, potassium 3.2, chloride 105, bicarb 23, nonfasting blood glucose 2 93, GFR greater than 60, magnesium 1.9, high sensitive troponin was 6 and then 7.  WBC 8.4, hemoglobin 14.1, platelets of 240.  D-dimer was 0.45.  ED treatment: Maalox, fentanyl 50 mcg, oxycodone 50 mg p.o. one-time dose, Protonix 40 mg IV, potassium chloride 40 mill equivalent, sulfate 1 g, LR 1 L bolus, nitroglycerin sublingual 0.4 mg every 5 minutes as needed for chest pain.  He is aao to self, age, location.   He reports squeezing chest tightness that is occasionally sharp.  He experiences radiation to  his jaw, left shoulder, and left upper extremity, with associated shortness of breath. He endorses shortness of breath with exertion. He states these symptoms are similar to prior episodes of chest pressure like in February of 2023 this year.   He endorses that his wife knows his medication and anything that she tells him to take he does.  He is not certain if he has had his Imdur or Ranexa, he states to ask his wife.-Atorvastatin 80 mg daily resumed  Social  history: He lives with his wife. He currently smokes 0.25 ppd. At his peak, he was smoking more than 1 pack per day. He formerly was a heavy etoh user, 8-10 shots per day. Last drink was months ago. He denies current recreational drug use.   ROS: Constitutional: no weight change, no fever ENT/Mouth: no sore throat, no rhinorrhea Eyes: no eye pain, no vision changes Cardiovascular: + chest pain, + dyspnea,  no edema, no palpitations Respiratory: no cough, no sputum, no wheezing Gastrointestinal: no nausea, no vomiting, no diarrhea, no constipation Genitourinary: no urinary incontinence, no dysuria, no hematuria Musculoskeletal: no arthralgias, no myalgias Skin: no skin lesions, no pruritus, Neuro: + weakness, no loss of consciousness, no syncope Psych: no anxiety, no depression, + decrease appetite Heme/Lymph: no bruising, no bleeding  ED Course: Discussed with emergency medicine provider, patient requiring hospitalization for chief concerns of chest pain.  Assessment/Plan  Principal Problem:   Chest pain Active Problems:   Nicotine dependence, cigarettes, uncomplicated   OSA (obstructive sleep apnea)   Hypercholesteremia   Morbid obesity (HCC)   PTSD (post-traumatic stress disorder)   Assessment and Plan:  * Chest pain - Nitroglycerin ointment 1 inch, every 6 hours as needed for chest pain - Resumed home atorvastatin 80 mg daily daily - Oxycodone 15 mg p.o. every 4 hours as needed for moderate pain, this is a home medication Morphine 4 mg IV every 4 hours as needed for severe pain, 4 doses ordered - EDP initiated heparin bolus gtt. - EDP consulted Dr. Stanford Breed and he does not recommend nitroglycerin  gtt. at this time - AM team to consult cardiology if further indication needed - Admit to telemetry cardiac, observation  Nicotine dependence, cigarettes, uncomplicated - Nicotine patch ordered  Hypercholesteremia - Atorvastatin 80 mg daily resumed  I discussed with patient  that it was per [facility] recommendation that if he continues to experience chest pain, it was recommended that he get a cardiac MRI.  Patient adamant states that he cannot get an MRI due to claustrophobia.  Med rec - am team to complete med reconciliiation  Chart reviewed.   Hospitalization from 01/13/2022 to 01/17/2022: Patient had left heart cath which showed similar to prior, no intervention was done at that time.  Patient was started on Plavix, Imdur, Ranexa - It was recommended that if patient continues to have chest pain, a cardiac MRI should be ordered to evaluate further  Patient has had multiple hospitalization for chest pain and CT coronary fractional flow   DVT prophylaxis: Heparin GTT Code Status: full code  Diet: heart healthy Family Communication: wife knows hes here Disposition Plan: Pending clinical course Consults called: None at this time, a.m. team to consult cardiology if patient continues to have pain Admission status: Telemetry cardiac, observation  Past Medical History:  Diagnosis Date   Anxiety    Arthritis    Chronic pain    Diabetes mellitus without complication (HCC)    GERD (gastroesophageal reflux disease)    History of degenerative disc disease    Hypercholesteremia    Hypertension    Knee pain, bilateral    Myocardial infarct [facility])    PTSD (post-traumatic stress disorder)    Pulmonary embolism ([place])    Sleep apnea    Stroke ([place]) 2015   mold stroke per patient   Past Surgical History:  Procedure Laterality Date   ANGIOPLASTY / STENTING FEMORAL     2 stents   COLONOSCOPY WITH PROPOFOL N/A 05/07/2017   Procedure: COLONOSCOPY WITH PROPOFOL;  Surgeon: Lollie Sails, MD;  Location: [facility] ENDOSCOPY;  Service: Endoscopy;  Laterality: N/A;   COLONOSCOPY WITH PROPOFOL N/A 04/11/2021   Procedure: COLONOSCOPY WITH PROPOFOL;  Surgeon: Virgel Manifold, MD;  Location: [facility] ENDOSCOPY;  Service: Endoscopy;  Laterality: N/A;   COLONOSCOPY WITH PROPOFOL  N/A 03/21/2022   Procedure: COLONOSCOPY WITH PROPOFOL;  Surgeon: Lin Landsman, MD;  Location: [facility] ENDOSCOPY;  Service: Gastroenterology;  Laterality: N/A;   ESOPHAGOGASTRODUODENOSCOPY (EGD) WITH PROPOFOL N/A 05/07/2017   Procedure: ESOPHAGOGASTRODUODENOSCOPY (EGD) WITH PROPOFOL;  Surgeon: Lollie Sails, MD;  Location: [facility] ENDOSCOPY;  Service: Endoscopy;  Laterality: N/A;   HERNIA REPAIR     Knee surgeries     TONSILLECTOMY     Social History:  reports that he has been smoking cigarettes. He has a 20.00 pack-year smoking history. He has never used smokeless tobacco. He reports that he does not currently use alcohol after a past usage of about 4.0 standard drinks of alcohol per week. He reports that he does not currently use drugs.  Allergies  Allergen Reactions   Bee Venom Anaphylaxis   Iodinated Contrast Media Anaphylaxis and Swelling   Metformin And Related Anaphylaxis    Unknown    Lisinopril Swelling   Family History  Problem Relation Age of Onset   Diabetes Mother    Cancer Paternal Grandmother    Cancer Paternal Grandfather    Heart attack Maternal Grandfather 36   Family history: Family history reviewed and not pertinent  Prior to Admission medications   Medication Sig Start Date End  Date Taking? Authorizing Provider  Accu-Chek Softclix Lancets lancets 1 each 3 (three) times daily. 01/17/22   [provider]  amLODipine (NORVASC) 5 MG tablet Take 5 mg by mouth daily. 12/17/20 02/10/22  [provider]  aspirin EC 81 MG tablet Take 81 mg by mouth daily.    [provider]  atorvastatin (LIPITOR) 80 MG tablet Take 80 mg by mouth daily. 06/05/21 06/05/22  [provider]  Blood Glucose Monitoring Suppl (ACCU-CHEK GUIDE ME) w/Device KIT See admin instructions. 01/17/22   [provider]  Blood Glucose Monitoring Suppl (FIFTY50 GLUCOSE METER 2.0) w/Device KIT Check once daily 10/28/21 10/28/22  [provider]  cetirizine  (ZYRTEC) 10 MG tablet Take 10 mg by mouth daily.    [provider]  clopidogrel (PLAVIX) 75 MG tablet Take 75 mg by mouth daily. 06/14/20   [provider]  Cysteamine Bitartrate (PROCYSBI) 300 MG PACK Use 1 each 3 (three) times daily 01/17/22 01/17/23  [provider]  diazepam (VALIUM) 5 MG tablet Take by mouth. 03/20/22   [provider]  diphenhydrAMINE (BENADRYL) 25 mg capsule Take 50 mg by mouth every 6 (six) hours as needed for itching or allergies.    [provider]  EPINEPHrine 0.3 mg/0.3 mL IJ SOAJ injection Inject into the muscle. 09/24/19   [provider]  erythromycin ophthalmic ointment Place a 1/2 inch ribbon of ointment onto the upper eyelid margin. 01/24/22   Margarette Canada, NP  esomeprazole (NEXIUM) 20 MG capsule Take by mouth.    [provider]  ezetimibe (ZETIA) 10 MG tablet Take 10 mg by mouth daily. 01/18/22 01/18/23  [provider]  glimepiride (AMARYL) 2 MG tablet Take 2-4 mg by mouth 2 (two) times daily. 01/17/22   [provider]  glucose blood (FREESTYLE INSULINX TEST) test strip daily. 04/25/21   [provider]  isosorbide mononitrate (IMDUR) 30 MG 24 hr tablet Take 1 tablet (30 mg total) by mouth daily. 02/11/22 03/13/22  Wyvonnia Dusky, MD  Lancets (FREESTYLE) lancets daily. 04/25/21   [provider]  losartan (COZAAR) 100 MG tablet Take 100 mg by mouth daily. 01/18/22 01/18/23  [provider]  losartan-hydrochlorothiazide (HYZAAR) 100-25 MG tablet Take 1 tablet by mouth daily. 03/20/22 03/20/23  [provider]  metoprolol succinate (TOPROL-XL) 50 MG 24 hr tablet Take 50 mg by mouth daily. 12/24/17   [provider]  MONTELUKAST SODIUM PO Take 1 tablet by mouth daily. Patient not taking: Reported on 07/12/2020    [provider]  nortriptyline (PAMELOR) 25 MG capsule Take 25 mg by mouth at bedtime. 10/25/21   [provider]  oxyCODONE  (ROXICODONE) 15 MG immediate release tablet Take by mouth. 03/20/22 05/19/22  [provider]  ranolazine (RANEXA) 500 MG 12 hr tablet Take 500 mg by mouth 2 (two) times daily. Patient not taking: Reported on 02/10/2022 01/17/22 01/17/23  [provider]   Physical Exam: Vitals:   05/08/22 2006 05/08/22 2018 05/08/22 2100 05/08/22 2258  BP: (!) 153/77 (!) 153/77 (!) 151/92 (!) 155/90  Pulse: 74 70 68 67  Resp:  17  20  Temp:  98 F (36.7 C)  97.7 F (36.5 C)  TempSrc:    Oral  SpO2: 97% 97% 95% 96%  Weight:      Height:       Constitutional: appears older than chronological age, NAD, calm, comfortable Eyes: PERRL, lids and conjunctivae normal ENMT: Mucous membranes are moist. Posterior pharynx  clear of any exudate or lesions. Age-appropriate dentition. Hearing appropriate Neck: normal, supple, no masses, no thyromegaly Respiratory: clear to auscultation bilaterally, no wheezing, no crackles. Normal respiratory effort. No accessory muscle use.  Cardiovascular: Regular rate and rhythm, no murmurs / rubs / gallops. No extremity edema. 2+ pedal pulses. No carotid bruits.  Abdomen: Obese abdomen, no tenderness, no masses palpated, no hepatosplenomegaly. Bowel sounds positive.  Musculoskeletal: no clubbing / cyanosis. No joint deformity upper and lower extremities. Good ROM, no contractures, no atrophy. Normal muscle tone.  Skin: no rashes, lesions, ulcers. No induration Neurologic: Sensation intact. Strength 5/5 in all 4.  Psychiatric: Normal judgment and insight. Alert and oriented x 3. Normal mood.   EKG: independently reviewed, showing sinus rhythm with rate of 72, QTc 405  Chest x-ray on Admission: I personally reviewed and I agree with radiologist reading as below.  DG Chest 2 View  Result Date: 05/08/2022 CLINICAL DATA:  Chest pain EXAM: CHEST - 2 VIEW COMPARISON:  Chest x-ray 02/09/2022 FINDINGS: Heart size and mediastinal contours are within normal limits. Mild  likely subsegmental atelectasis at the left lung base. No pleural effusion or pneumothorax visualized. No acute osseous abnormality appreciated. IMPRESSION: Mild likely subsegmental atelectasis at the left lung base. Electronically Signed   By: Ofilia Neas M.D.   On: 05/08/2022 18:38    Labs on Admission: I have personally reviewed following labs  CBC: Recent Labs  Lab 05/08/22 1820  WBC 8.4  HGB 16.1  HCT 43.8  MCV 86.7  PLT 239   Basic Metabolic Panel: Recent Labs  Lab 05/08/22 1820 05/08/22 1825  NA 137  --   K 3.2*  --   CL 105  --   CO2 23  --   GLUCOSE 293*  --   BUN 11  --   CREATININE 0.62  --   CALCIUM 9.0  --   MG  --  1.9   GFR: Estimated Creatinine Clearance: 148.6 mL/min (by C-G formula based on SCr of 0.62 mg/dL).  Urine analysis:    Component Value Date/Time   COLORURINE YELLOW 07/01/2018 0004   APPEARANCEUR CLEAR 07/01/2018 0004   APPEARANCEUR Clear 12/23/2014 1855   LABSPEC 1.030 07/01/2018 0004   LABSPEC 1.011 12/23/2014 1855   PHURINE 5.0 07/01/2018 0004   GLUCOSEU >=500 (A) 07/01/2018 0004   GLUCOSEU Negative 12/23/2014 1855   HGBUR NEGATIVE 07/01/2018 0004   BILIRUBINUR NEGATIVE 07/01/2018 0004   BILIRUBINUR Negative 12/23/2014 1855   KETONESUR NEGATIVE 07/01/2018 0004   PROTEINUR NEGATIVE 07/01/2018 0004   UROBILINOGEN 0.2 12/07/2009 1204   NITRITE NEGATIVE 07/01/2018 0004   LEUKOCYTESUR NEGATIVE 07/01/2018 0004   LEUKOCYTESUR Negative 12/23/2014 1855   Dr. Tobie Poet [facility]  If 7PM-7AM, please contact overnight-coverage provider If 7AM-7PM, please contact day coverage provider [email or web address]  05/08/2022, 11:32 PM

## 2022-05-08 NOTE — Assessment & Plan Note (Signed)
Nicotine patch ordered.

## 2022-05-08 NOTE — Hospital Course (Signed)
Mr. Samuel Mcpeek is a 46 year old male with history of hypertension, chronic pain syndrome, currently has a pain contract, hyperlipidemia, CAD and NSTEMI status post DES to LAD in 09/2019, history of provoked DVT after knee surgery, continued tobacco user, heavy alcohol use, chronic opioid use, PTSD, who presents emergency department for chief concerns of chest pain.  Initial vitals in the emergency department showed temperature of 98.5, respiration rate of 17, heart rate of 128 and improved to 59, blood pressure 138/100, SPO2 of 97% on room air.  Serum sodium is 137, potassium 3.2, chloride 105, bicarb 23, nonfasting blood glucose 2 93, GFR greater than 60, magnesium 1.9, high sensitive troponin was 6 and then 7.  WBC 8.4, hemoglobin 14.1, platelets of 240.  D-dimer was 0.45.  ED treatment: Maalox, fentanyl 50 mcg, oxycodone 50 mg p.o. one-time dose, Protonix 40 mg IV, potassium chloride 40 mill equivalent, sulfate 1 g, LR 1 L bolus, nitroglycerin sublingual 0.4 mg every 5 minutes as needed for chest pain.

## 2022-05-08 NOTE — ED Provider Notes (Signed)
[facility] Provider Note    Event Date/Time   First MD Initiated Contact with Patient 05/08/22 1938     (approximate)   History   Chest Pain   HPI  Caleb [name] is a 46 y.o. male with past medical history of anxiety, arthritis, DM, GERD, HTN, HDL, PTSD, PE, OSA, PE not currently anticoagulated and CAD followed by cardiology at [facility] with a history of a previous stent placed in 2020 most recently evaluated during an admission of this year at [facility] in February status post left heart catheter showing unchanged CAD and no interventions were performed who presents for evaluation of some acute left-sided chest pain rating to his left shoulder and jaw associate with some nausea that started around 5 PM.  Patient states he was sitting in his car when it started.  No clear leaving aggravating factors other than some nitroglycerin which he received by EMS which slightly improved his pain.  He also received 4 tablets of baby ASA with EMS.  He states he was in his usual health before his pain started this morning.  No recent fevers, cough, vomiting, diarrhea, abdominal pain, back pain rash or extremity pain.  Endorses tobacco abuse and states he takes oxycodone chronically for chronic pain but denies any illicit drug use or heavy EtOH use recently.  States he thinks he is compliant with all his medications.      Physical Exam  Triage Vital Signs: ED Triage Vitals  Enc Vitals Group     BP 05/08/22 1816 (!) 138/100     Pulse Rate 05/08/22 1807 (!) 128     Resp 05/08/22 1807 (!) 2     Temp 05/08/22 1807 98.5 F (36.9 C)     Temp Source 05/08/22 1807 Oral     SpO2 05/08/22 1807 97 %     Weight 05/08/22 1808 240 lb (108.9 kg)     Height 05/08/22 1808 6' (1.829 m)     Head Circumference --      Peak Flow --      Pain Score 05/08/22 1808 8     Pain Loc --      Pain Edu? --      Excl. in [place]? --     Most recent vital signs: Vitals:   05/08/22 2018 05/08/22 2100   BP: (!) 153/77 (!) 151/92  Pulse: 70 68  Resp: 17   Temp: 98 F (36.7 C)   SpO2: 97% 95%    General: Awake, uncomfortable appearing. CV:  Good peripheral perfusion.  2+ radial pulse. Resp:  Normal effort.  Clear bilaterally. Abd:  No distention.  Soft. Other:  Significant lower extremity edema.   ED Results / Procedures / Treatments  Labs (all labs ordered are listed, but only abnormal results are displayed) Labs Reviewed  BASIC METABOLIC PANEL - Abnormal; Notable for the following components:      Result Value   Potassium 3.2 (*)    Glucose, Bld 293 (*)    All other components within normal limits  CBC - Abnormal; Notable for the following components:   MCHC 36.8 (*)    All other components within normal limits  MAGNESIUM  D-DIMER, QUANTITATIVE (NOT AT [facility])  HEMOGLOBIN A1C  TROPONIN I (HIGH SENSITIVITY)  TROPONIN I (HIGH SENSITIVITY)     EKG  EKG is remarkable for sinus rhythm with a ventricular rate of 72, normal axis with nonspecific ST change in lead III without other clear evidence of  acute ischemia or significant arrhythmia.   RADIOLOGY  Chest x-ray my interpretation shows no pneumothorax, overt edema, large effusion but does appear to show some subtle atelectasis versus early infiltrate at the right base without any other clear focal consolidation or acute process.  PROCEDURES:  Critical Care performed: Yes, see critical care procedure note(s)  .Critical Care  Performed by: Lucrezia Starch, MD Authorized by: Lucrezia Starch, MD   Critical care provider statement:    Critical care time (minutes):  30   Critical care was necessary to treat or prevent imminent or life-threatening deterioration of the following conditions:  Cardiac failure   Critical care was time spent personally by me on the following activities:  Development of treatment plan with patient or surrogate, discussions with consultants, evaluation of patient's response to treatment,  examination of patient, ordering and review of laboratory studies, ordering and review of radiographic studies, ordering and performing treatments and interventions, pulse oximetry, re-evaluation of patient's condition and review of old charts     MEDICATIONS ORDERED IN ED: Medications  atorvastatin (LIPITOR) tablet 80 mg (has no administration in time range)  ranolazine (RANEXA) 12 hr tablet 500 mg (has no administration in time range)  nortriptyline (PAMELOR) capsule 25 mg (has no administration in time range)  acetaminophen (TYLENOL) tablet 650 mg (has no administration in time range)  ondansetron (ZOFRAN) injection 4 mg (has no administration in time range)  enoxaparin (LOVENOX) injection 40 mg (has no administration in time range)  alum & mag hydroxide-simeth (MAALOX/MYLANTA) 200-200-20 MG/5ML suspension 30 mL (has no administration in time range)    And  lidocaine (XYLOCAINE) 2 % viscous mouth solution 15 mL (has no administration in time range)  ondansetron (ZOFRAN) injection 4 mg (4 mg Intravenous Given 05/08/22 1957)  fentaNYL (SUBLIMAZE) injection 50 mcg (50 mcg Intravenous Given 05/08/22 1958)  potassium chloride SA (KLOR-CON M) CR tablet 40 mEq (40 mEq Oral Given 05/08/22 2022)  pantoprazole (PROTONIX) injection 40 mg (40 mg Intravenous Given 05/08/22 2119)  sucralfate (CARAFATE) tablet 1 g (1 g Oral Given 05/08/22 2114)  alum & mag hydroxide-simeth (MAALOX/MYLANTA) 200-200-20 MG/5ML suspension 30 mL (30 mLs Oral Given 05/08/22 2114)  lactated ringers bolus 1,000 mL (1,000 mLs Intravenous New Bag/Given 05/08/22 2122)  oxyCODONE (Oxy IR/ROXICODONE) immediate release tablet 15 mg (15 mg Oral Given 05/08/22 2143)     IMPRESSION / MDM / [employer] / ED COURSE  I reviewed the triage vital signs and the nursing notes. Patient's presentation is most consistent with acute presentation with potential threat to life or bodily function.                               Differential  diagnosis includes, but is not limited to, ACS, PE, pneumothorax, MSK and esophageal spasm with lower suspicion for dissection given description of symptoms as tightness without any ripping or tearing to the back.  EKG is remarkable for sinus rhythm with a ventricular rate of 72, normal axis with nonspecific ST change in lead III without other clear evidence of acute ischemia or significant arrhythmia.  Troponins x2 are not elevated and not suggestive of an NSTEMI although overall his history and risk factors are concerning for unstable angina.  Chest x-ray my interpretation shows no pneumothorax, overt edema, large effusion but does appear to show some subtle atelectasis versus early infiltrate at the right base without any other clear focal consolidation or acute process.  BMP is remarkable for K of 3.2 and a glucose of 293 without any other significant derangements.  CBC without leukocytosis or acute anemia.  Magnesium 1.9.  D-dimer 0.45 and not suggestive of PE.  Overall I have a lower suspicion for dissection and I am concerned for unstable angina.  Patient reported ongoing tightness after GI cocktail as well.  I consulted with on-call cardiologist Dr. Stanford Breed who recommended against initiating nitroglycerin drip.  Will initiate heparin drip and admitted to hospitalist service with concern for unstable angina.      FINAL CLINICAL IMPRESSION(S) / ED DIAGNOSES   Final diagnoses:  Chest pain, unspecified type  History of CAD (coronary artery disease)  Hypertension, unspecified type  Hypokalemia  Hyperglycemia     Rx / DC Orders   ED Discharge Orders     None        Note:  This document was prepared using Dragon voice recognition software and may include unintentional dictation errors.   Lucrezia Starch, MD 05/08/22 2219

## 2022-05-08 NOTE — ED Notes (Signed)
ED Provider at bedside. 

## 2022-05-08 NOTE — ED Triage Notes (Addendum)
Patient arrived by EMS from home with c/o left chest pain with radiation to left jaw, shoulder blade and arm. HX stents and PE. EMS administered '4mg'$  zofran, 324 aspirin and 3 nitro spray. EMS vitals Blood sugar 221, 099.3oral, 73HR, 97% RA, 30RR, 159/73 blood pressure.

## 2022-05-08 NOTE — ED Triage Notes (Signed)
Pt brought in via ems from home with chest pain.  Pt also has sob.  Ems gave asa and ntg and nausea med.  Sx began at 1700  pt alert  speech clear.  Iv in place.

## 2022-05-08 NOTE — Assessment & Plan Note (Signed)
-   Atorvastatin 80 mg daily resumed 

## 2022-05-08 NOTE — Assessment & Plan Note (Signed)
-   Nitroglycerin ointment 1 inch, every 6 hours as needed for chest pain - Resumed home atorvastatin 80 mg daily daily - Oxycodone 15 mg p.o. every 4 hours as needed for moderate pain, this is a home medication Morphine 4 mg IV every 4 hours as needed for severe pain, 4 doses ordered - EDP initiated heparin bolus gtt. - EDP consulted Dr. Stanford Breed and he does not recommend nitroglycerin gtt. at this time - AM team to consult cardiology if further indication needed - Admit to telemetry cardiac, observation

## 2022-05-08 NOTE — Consult Note (Signed)
ANTICOAGULATION CONSULT NOTE - Follow Up Consult  Pharmacy Consult for heparin gtt Indication: chest pain/ACS  Allergies  Allergen Reactions   Bee Venom Anaphylaxis   Iodinated Contrast Media Anaphylaxis and Swelling   Metformin And Related Anaphylaxis    Unknown    Lisinopril Swelling    Patient Measurements: Height: 6' (182.9 cm) Weight: 108.9 kg (240 lb) IBW/kg (Calculated) : 77.6 Heparin Dosing Weight: 100.6kg  Vital Signs: Temp: 98 F (36.7 C) (06/19 2018) Temp Source: Oral (06/19 1807) BP: 151/92 (06/19 2100) Pulse Rate: 68 (06/19 2100)  Labs: Recent Labs    05/08/22 1820 05/08/22 2003  HGB 16.1  --   HCT 43.8  --   PLT 240  --   CREATININE 0.62  --   TROPONINIHS 6 7   Estimated Creatinine Clearance: 148.6 mL/min (by C-G formula based on SCr of 0.62 mg/dL). Heparin Dosing Weight: 100.6kg  Medications:  PTA: no AC. Pt on ASA '81mg'$  QD & plavix '75mg'$  QD. Inpatient: +Hep gtt  Assessment: 46 y.o. male w/ h/o anxiety, arthritis, DM, GERD, HTN, HDL, PTSD, OSA, PE (not currently anticoagulated) and CAD (stent placed in 2020) presents for evaluation of some acute left-sided chest pain rating to his left shoulder and jaw associated with some nausea that started around 5 PM.Pharmacy consulted for mgmt of heparin gtt.  Date Time aPTT/HL Rate/Comment       Baseline Labs: aPTT - pending INR - pending Hgb - 16.1 Plts - 240 Trop - 7>__  Goal of Therapy:  Heparin level 0.3-0.7 units/ml Monitor platelets by anticoagulation protocol: Yes   Plan:  Give 4000 units bolus x1; then start heparin infusion at 1350 units/hr Check anti-Xa level in 6 hours and daily once consecutively therapeutic. Continue to monitor H&H and platelets daily while on heparin gtt.  Shanon Brow Suleyma Wafer 05/08/2022,10:23 PM

## 2022-05-09 ENCOUNTER — Encounter: Payer: Self-pay | Admitting: Internal Medicine

## 2022-05-09 ENCOUNTER — Other Ambulatory Visit (HOSPITAL_COMMUNITY): Payer: Self-pay

## 2022-05-09 ENCOUNTER — Other Ambulatory Visit: Payer: Self-pay

## 2022-05-09 DIAGNOSIS — R079 Chest pain, unspecified: Secondary | ICD-10-CM | POA: Diagnosis not present

## 2022-05-09 LAB — HEMOGLOBIN A1C
Hgb A1c MFr Bld: 10.2 % — ABNORMAL HIGH (ref 4.8–5.6)
Mean Plasma Glucose: 246.04 mg/dL

## 2022-05-09 LAB — URINE DRUG SCREEN, QUALITATIVE (ARMC ONLY)
Amphetamines, Ur Screen: NOT DETECTED
Barbiturates, Ur Screen: NOT DETECTED
Benzodiazepine, Ur Scrn: NOT DETECTED
Cannabinoid 50 Ng, Ur ~~LOC~~: NOT DETECTED
Cocaine Metabolite,Ur ~~LOC~~: NOT DETECTED
MDMA (Ecstasy)Ur Screen: NOT DETECTED
Methadone Scn, Ur: NOT DETECTED
Opiate, Ur Screen: NOT DETECTED
Phencyclidine (PCP) Ur S: NOT DETECTED
Tricyclic, Ur Screen: POSITIVE — AB

## 2022-05-09 LAB — CBC
HCT: 44.3 % (ref 39.0–52.0)
Hemoglobin: 15.7 g/dL (ref 13.0–17.0)
MCH: 31.3 pg (ref 26.0–34.0)
MCHC: 35.4 g/dL (ref 30.0–36.0)
MCV: 88.4 fL (ref 80.0–100.0)
Platelets: 205 10*3/uL (ref 150–400)
RBC: 5.01 MIL/uL (ref 4.22–5.81)
RDW: 11.9 % (ref 11.5–15.5)
WBC: 9.3 10*3/uL (ref 4.0–10.5)
nRBC: 0 % (ref 0.0–0.2)

## 2022-05-09 LAB — HEPARIN LEVEL (UNFRACTIONATED): Heparin Unfractionated: 0.26 IU/mL — ABNORMAL LOW (ref 0.30–0.70)

## 2022-05-09 LAB — GLUCOSE, CAPILLARY: Glucose-Capillary: 312 mg/dL — ABNORMAL HIGH (ref 70–99)

## 2022-05-09 LAB — PROTIME-INR
INR: 1 (ref 0.8–1.2)
Prothrombin Time: 13.2 seconds (ref 11.4–15.2)

## 2022-05-09 LAB — CBG MONITORING, ED
Glucose-Capillary: 248 mg/dL — ABNORMAL HIGH (ref 70–99)
Glucose-Capillary: 377 mg/dL — ABNORMAL HIGH (ref 70–99)

## 2022-05-09 LAB — APTT: aPTT: 47 seconds — ABNORMAL HIGH (ref 24–36)

## 2022-05-09 MED ORDER — ENOXAPARIN SODIUM 60 MG/0.6ML IJ SOSY
0.5000 mg/kg | PREFILLED_SYRINGE | INTRAMUSCULAR | Status: DC
  Administered 2022-05-09: 55 mg via SUBCUTANEOUS
  Filled 2022-05-09: qty 0.6

## 2022-05-09 MED ORDER — INSULIN ASPART 100 UNIT/ML IJ SOLN
0.0000 [IU] | Freq: Every day | INTRAMUSCULAR | Status: DC
  Administered 2022-05-09: 5 [IU] via SUBCUTANEOUS
  Filled 2022-05-09: qty 1

## 2022-05-09 MED ORDER — HEPARIN BOLUS VIA INFUSION
1500.0000 [IU] | Freq: Once | INTRAVENOUS | Status: AC
  Administered 2022-05-09: 1500 [IU] via INTRAVENOUS
  Filled 2022-05-09: qty 1500

## 2022-05-09 MED ORDER — METOPROLOL SUCCINATE ER 25 MG PO TB24
25.0000 mg | ORAL_TABLET | Freq: Every day | ORAL | Status: DC
  Administered 2022-05-10: 25 mg via ORAL
  Filled 2022-05-09: qty 1

## 2022-05-09 MED ORDER — DIPHENHYDRAMINE HCL 25 MG PO CAPS
25.0000 mg | ORAL_CAPSULE | Freq: Once | ORAL | Status: AC
  Administered 2022-05-09: 25 mg via ORAL
  Filled 2022-05-09: qty 1

## 2022-05-09 MED ORDER — INSULIN ASPART 100 UNIT/ML IJ SOLN
0.0000 [IU] | Freq: Three times a day (TID) | INTRAMUSCULAR | Status: DC
  Administered 2022-05-09: 5 [IU] via SUBCUTANEOUS
  Administered 2022-05-10: 8 [IU] via SUBCUTANEOUS
  Filled 2022-05-09 (×2): qty 1

## 2022-05-09 NOTE — Consult Note (Signed)
ANTICOAGULATION CONSULT NOTE - Follow Up Consult  Pharmacy Consult for heparin gtt Indication: chest pain/ACS  Allergies  Allergen Reactions   Bee Venom Anaphylaxis   Iodinated Contrast Media Anaphylaxis and Swelling   Metformin And Related Anaphylaxis    Unknown    Lisinopril Swelling    Patient Measurements: Height: 6' (182.9 cm) Weight: 108.9 kg (240 lb) IBW/kg (Calculated) : 77.6 Heparin Dosing Weight: 100.6kg  Vital Signs: Temp: 97.7 F (36.5 C) (06/20 0406) Temp Source: Oral (06/20 0406) BP: 112/59 (06/20 0626) Pulse Rate: 63 (06/20 0626)  Labs: Recent Labs    05/08/22 1820 05/08/22 2003 05/09/22 0518 05/09/22 0530  HGB 16.1  --   --  15.7  HCT 43.8  --   --  44.3  PLT 240  --   --  205  APTT  --   --  47*  --   LABPROT  --   --  13.2  --   INR  --   --  1.0  --   HEPARINUNFRC  --   --   --  0.26*  CREATININE 0.62  --   --   --   TROPONINIHS 6 7  --   --     Estimated Creatinine Clearance: 148.6 mL/min (by C-G formula based on SCr of 0.62 mg/dL). Heparin Dosing Weight: 100.6kg  Medications:  PTA: no AC. Pt on ASA '81mg'$  QD & plavix '75mg'$  QD. Inpatient: +Hep gtt  Assessment: 46 y.o. male w/ h/o anxiety, arthritis, DM, GERD, HTN, HDL, PTSD, OSA, PE (not currently anticoagulated) and CAD (stent placed in 2020) presents for evaluation of some acute left-sided chest pain rating to his left shoulder and jaw associated with some nausea that started around 5 PM.Pharmacy consulted for mgmt of heparin gtt.  Date Time aPTT/HL Rate/Comment       Baseline Labs: aPTT - pending INR - pending Hgb - 16.1 Plts - 240 Trop - 7>__  Goal of Therapy:  Heparin level 0.3-0.7 units/ml Monitor platelets by anticoagulation protocol: Yes   Plan:  6/20:  HL @ 0530 = 0.26, SUBtherapeutic  Will order heparin 1500 units IV X 1 bolus and increase drip rate to 1550 units/hr. Will recheck HL 6 hrs after rate change.   Caleb [name] D 05/09/2022,6:51 AM

## 2022-05-09 NOTE — Progress Notes (Signed)
Admission profile updated. ?

## 2022-05-09 NOTE — ED Notes (Signed)
RN aware of assigned bed 

## 2022-05-09 NOTE — Care Management Obs Status (Signed)
[place] NOTIFICATION   Patient Details  Name: Caleb [name] MRN: [id number] Date of Birth: 1976/03/26   Medicare Observation Status Notification Given:  Yes    Donnelly Angelica, LCSW 05/09/2022, 3:01 PM

## 2022-05-09 NOTE — Consult Note (Signed)
[name] NOTE       Patient ID: Caleb [name] MRN: [id number] DOB/AGE: [birth year] 46 y.o.  Admit date: 05/08/2022 Referring Physician Dr. Priscella Mann  Primary Physician [facility]  Primary Cardiologist Dr. Venetia Maxon  Reason for Consultation chest pain   HPI: [name] "[name]" Mcewan is a 46yoM with a PMH of CAD s/p PCI with DES x1 to mid LAD bifurcation 09/23/2019 and recent [place] 01/16/2022 at [facility] showing moderate CAD without clear intervention target, treated medically, tobacco abuse, poorly controlled type 2 diabetes, hypertension, hyperlipidemia, DDD on chronic opioids, anxiety, who presented to [facility] ED 05/09/2022 with chest discomfort.  Cardiology is consulted for further assistance.  The patient states yesterday afternoon he just go home after driving his car and had 10/10 central chest pressure and tightness that radiated to his left shoulder and neck. He felt nauseous and somewhat lightheaded, but did not vomit. Non exertional, but did get somewhat better to an 8/10 after SL nitroglycerin. Eventually relieved in the ED with oxycodone. This pain is similar to what he had prior to when he needed his stent in 2020, and has not had chest pain like this since March when he presented to the ED. Notably, his BP was severely elevated at 200/140 per EMS. He has not followed up with cardiology in the outpatient setting since his last ER visit due to some issues with his wife's health. Reports compliance with his medications, although he cannot name what he takes to me. He currently states his chest pressure is a 2 or 3 out of 10.   Currently smokes 1/4 PPD, down from 1PPD. 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 Denies drug use or use of nonprescription narcotics - takes percocet daily for knee and back pain.   Vitals during interview are notable for a BP of 112/59 (overnight was 155/90), HR 63, comfortable on room air.  Labs are notable for potassium 3.2,  BUN/creatinine 11/0.16, GFR greater than 60.  Magnesium 1.9.  High-sensitivity troponin negative at 6-7.  H&H stable at 15.7/44.3, platelets 205.  Hemoglobin A1c 10.2 chest x-ray likely subsegmental atelectasis in the left lung base without other active disease.  Review of systems complete and found to be negative unless listed above     Past Medical History:  Diagnosis Date   Anxiety    Arthritis    Chronic pain    Diabetes mellitus without complication (HCC)    GERD (gastroesophageal reflux disease)    History of degenerative disc disease    Hypercholesteremia    Hypertension    Knee pain, bilateral    Myocardial infarct [facility])    PTSD (post-traumatic stress disorder)    Pulmonary embolism (HCC)    Sleep apnea    Stroke ([name]) 2015   mold stroke per patient    Past Surgical History:  Procedure Laterality Date   ANGIOPLASTY / STENTING FEMORAL     2 stents   COLONOSCOPY WITH PROPOFOL N/A 05/07/2017   Procedure: COLONOSCOPY WITH PROPOFOL;  Surgeon: Lollie Sails, MD;  Location: [facility] ENDOSCOPY;  Service: Endoscopy;  Laterality: N/A;   COLONOSCOPY WITH PROPOFOL N/A 04/11/2021   Procedure: COLONOSCOPY WITH PROPOFOL;  Surgeon: Virgel Manifold, MD;  Location: [facility] ENDOSCOPY;  Service: Endoscopy;  Laterality: N/A;   COLONOSCOPY WITH PROPOFOL N/A 03/21/2022   Procedure: COLONOSCOPY WITH PROPOFOL;  Surgeon: Lin Landsman, MD;  Location: [facility] ENDOSCOPY;  Service: Gastroenterology;  Laterality: N/A;   ESOPHAGOGASTRODUODENOSCOPY (EGD) WITH PROPOFOL N/A  05/07/2017   Procedure: ESOPHAGOGASTRODUODENOSCOPY (EGD) WITH PROPOFOL;  Surgeon: Lollie Sails, MD;  Location: [facility] ENDOSCOPY;  Service: Endoscopy;  Laterality: N/A;   HERNIA REPAIR     Knee surgeries     TONSILLECTOMY      (Not in a hospital admission)  Social History   Socioeconomic History   Marital status: Married    Spouse name: Not on file   Number of children: Not on file   Years of education: Not on file    Highest education level: Not on file  Occupational History   Not on file  Tobacco Use   Smoking status: Every Day    Packs/day: 1.00    Years: 20.00    Total pack years: 20.00    Types: Cigarettes    Last attempt to quit: 04/21/2022    Years since quitting: 0.0   Smokeless tobacco: Never  Vaping Use   Vaping Use: Never used  Substance and Sexual Activity   Alcohol use: Not Currently    Alcohol/week: 4.0 standard drinks of alcohol    Types: 4 Cans of beer per week    Comment: occas.    Drug use: Not Currently   Sexual activity: Yes  Other Topics Concern   Not on file  Social History Narrative   Not on file   Social Determinants of Health   Financial Resource Strain: Not on file  Food Insecurity: Not on file  Transportation Needs: Not on file  Physical Activity: Not on file  Stress: Not on file  Social Connections: Not on file  Intimate Partner Violence: Not on file    Family History  Problem Relation Age of Onset   Diabetes Mother    Cancer Paternal Grandmother    Cancer Paternal Grandfather    Heart attack Maternal Grandfather 60      PHYSICAL EXAM General: Caucasian male sitting upright in hospital bed, well nourished, in no acute distress. HEENT:  Normocephalic and atraumatic.   Neck:   No JVD.  Lungs: Normal respiratory effort on room air. Clear bilaterally to auscultation. No wheezes, crackles, rhonchi.  Heart: HRRR . Normal S1 and S2 without gallops or murmurs. Abdomen: Obese appearing.  Msk: Normal strength and tone for age. Extremities: Warm and well perfused. No clubbing, cyanosis.  Trace bilateral lower extremity edema.  Neuro: Alert and oriented X 3. Psych:  Answers questions appropriately.   Labs:   Lab Results  Component Value Date   WBC 9.3 05/09/2022   HGB 15.7 05/09/2022   HCT 44.3 05/09/2022   MCV 88.4 05/09/2022   PLT 205 05/09/2022    Recent Labs  Lab 05/08/22 1820  NA 137  K 3.2*  CL 105  CO2 23  BUN 11  CREATININE 0.62   CALCIUM 9.0  GLUCOSE 293*   Lab Results  Component Value Date   CKTOTAL 76 05/08/2012   CKMB < 0.5 (L) 05/08/2012   TROPONINI <0.03 07/01/2018    Lab Results  Component Value Date   CHOL 228 (H) 07/01/2018   CHOL 256 (H) 11/10/2014   Lab Results  Component Value Date   HDL 36 (L) 07/01/2018   HDL 55 11/10/2014   Lab Results  Component Value Date   LDLCALC 140 (H) 07/01/2018   LDLCALC 180 (H) 11/10/2014   Lab Results  Component Value Date   TRIG 259 (H) 07/01/2018   TRIG 107 11/10/2014   Lab Results  Component Value Date   CHOLHDL 6.3 07/01/2018   No  results found for: "LDLDIRECT"    Radiology: DG Chest 2 View  Result Date: 05/08/2022 CLINICAL DATA:  Chest pain EXAM: CHEST - 2 VIEW COMPARISON:  Chest x-ray 02/09/2022 FINDINGS: Heart size and mediastinal contours are within normal limits. Mild likely subsegmental atelectasis at the left lung base. No pleural effusion or pneumothorax visualized. No acute osseous abnormality appreciated. IMPRESSION: Mild likely subsegmental atelectasis at the left lung base. Electronically Signed   By: Ofilia Neas M.D.   On: 05/08/2022 18:38    LHC 01/16/22 by Dr. Baron Hamper Moderate CAD, with no clear intervention target  He was extremely anxious about the entire procedure, including the camera being close to his face  Left main was difficult to engage and did dampen, but appearance is similar to 2021 when iFR was done and was normal  No other changes in coronary anatomy since 2021   Recommendations:   Medical therapy for CAD  If refractory symptoms, he should be considered for CT-FFR or adenosine cMRI (assess ischemia in LM)  Sheath out, TR band applied    LHC 09/23/2019 RCA & LCx without significant disease   Ostial LM with vasospasm and 20% plaque-- confirmed with IC nitro and IVUS  MidLAD bifurcation lesion with D2 -- 70-80% (IFR 0.88) -- IVUS for size   Lesion #1. 70-80% midLAD  JL 4 guide with SH  IVUS for size  -- heavily calcified lesion  IFR 0.88  2.72m pre-dilation  2.75x12mXience [place] (DES)  Post-dilated with 2.75x8m36mC balloon at high pressure  Excellent result with TIMI 3 flow in D2   Moderate sedation with IV benadryl, fentanyl, and versed personally supervised by me for a total of more than 20 minutes   No sedation issues  No complications  Minimal blood loss  Recommendations:   To holding in stable condition with TR band in place   Plavix '75mg'$  daily for 1 year  Aspirin '81mg'$  daily for lifetime  High dose statin  B-blocker  Cardiac rehab.    ECHO 01/14/2022 NORMAL LEFT VENTRICULAR SYSTOLIC FUNCTION WITH MILD LVH    NORMAL RIGHT VENTRICULAR SYSTOLIC FUNCTION    VALVULAR REGURGITATION: TRIVIAL AR    NO VALVULAR STENOSIS    3D acquisition and reconstructions were performed as part of this    examination to more accurately quantify the effects of heart failure    regardless of ejection fraction.   Compared with prior Echo study on 10/21/2020: NO SIGNIFICANT CHANGE   TELEMETRY reviewed by me: Sinus rhythm rate 60s to 80s  EKG reviewed by me: Sinus rhythm rate 72  ASSESSMENT AND PLAN:  DonFredderick SeverancearAlesi a [age]yoMth a PMH of CAD s/p PCI with DES x1 to mid LAD bifurcation 09/23/2019 [facility] without clear intervention target, treated medically, tobacco abuse, poorly controlled type 2 diabetes, hypertension, hyperlipidemia, DDD on chronic opioids, anxiety, who [facility] with chest discomfort.  Cardiology is consulted for further assistance.  #Chest pain-DDx hypertensive urgency vs cardiac chest pain vs noncardiac chest pain Patient presents with a single episode of 10 out of 10 central chest tightness and pressure that started after he finished driving his car with associated nausea and lightheadedness but no vomiting.  Some radiation to his left shoulder and neck.  Somewhat relieved with nitroglycerin and  eventually relieved with narcotics.  Notably, his blood pressure was severely elevated per EMS of 200/140, although he reports compliance with his medications (but cannot  name them to me).  At my time of evaluation this morning he is calm and comfortable, chest pressure he rates it 2 out of 10.  Thankfully, his troponins are negative on 2 repeats and his EKG is without acute ischemic changes.  He has notably had multiple heart caths over the past few years and has had difficulty with claustrophobia and the camera being close to his face.  We have offered him a coronary CTA and a nuclear medicine stress testing, which all involve some degree of lying flat and in imaging scanner which the patient strongly prefers to avoid due to claustrophobia as well. -S/p 324 mg aspirin by EMS, continue 81 mg aspirin daily -Stop heparin GTT for now and observe for any further worsening chest discomfort -Continue home medicines: Losartan 100-HCTZ 25, Imdur 30 mg, Ranexa 500 mg twice daily atorvastatin '80mg'$  -Restart metoprolol XL if his BP allows (was previously on '50mg'$  daily)  -defer repeat echocardiogram -We will observe today while he is off heparin and keep n.p.o. at midnight (no caffeine after 6 PM) in case he does have worsening discomfort and is agreeable to imaging in the morning if he continues to feel well, okay for discharge likely tomorrow morning with close outpatient follow-up with his cardiology team at [facility].  This patient's plan of care was discussed and created with Dr. Corky Sox and he is in agreement.  Signed: Tristan Schroeder , PA-C 05/09/2022, 8:39 AM [facility]

## 2022-05-09 NOTE — Inpatient Diabetes Management (Addendum)
Inpatient Diabetes Program Recommendations  AACE/ADA: New Consensus Statement on Inpatient Glycemic Control   Target Ranges:  Prepandial:   less than 140 mg/dL      Peak postprandial:   less than 180 mg/dL (1-2 hours)      Critically ill patients:  140 - 180 mg/dL    Latest Reference Range & Units 05/08/22 18:20  Glucose 70 - 99 mg/dL 293 (H)    Latest Reference Range & Units 02/10/22 01:56 05/08/22 18:25  Hemoglobin A1C 4.8 - 5.6 % 9.4 (H) 10.2 (H)   Review of Glycemic Control  Diabetes history: DM2 Outpatient Diabetes medications: Amaryl 4 mg BID Current orders for Inpatient glycemic control: None  Inpatient Diabetes Program Recommendations:    Insulin: Please consider ordering CBGs AC&HS, Novolog 0-15 units TID with meals, and Novolog 0-5 units QHS.  HbgA1C:  A1C 10.2% on 05/08/22 indicating an average glucose of 246 mg/dl over the past 2-3 months.   Outpatient DM medications: At discharge, please consider providing Rx for Ozempic 0.25 mg once a week.  Addendum 05/09/22'@12'$ :00-Spoke with patient at bedside and he called his wife over speaker phone so she could contribute to conversation. Patient is currently only taking Amaryl 4 mg BID for DM. Patient reports that he was taking a daily injection of DM medication but did not remember name of it. Patient's wife states he was taking Victoza but he ended up having to stop it due to cost about 3 years ago. Patient was prescribed Ozempic but could not afford it so it was never started.  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 Patient reports that he checks his glucose at home and it ranges from 100's-200's mg/dl usually. Discussed that current A1C is 10.2% on 05/08/22 which is increased from 9.4% on 02/10/22.  Discussed glucose and A1C goals. Discussed importance of checking CBGs and maintaining good CBG control to prevent long-term and short-term  complications. Explained how hyperglycemia leads to damage within blood vessels which lead to the common complications seen with uncontrolled diabetes. Stressed to the patient the importance of improving glycemic control to prevent further complications from uncontrolled diabetes. Discussed impact of nutrition, exercise, stress, sickness, and medications on diabetes control.   Discussed carbohydrates, carbohydrate goals per day and meal, along with portion sizes. Patient's wife states that patient does not follow a carb modified diet and that she feels he would benefit from consult with RD inpatient and outpatient. Placed order for RD consult and will also order outpatient DM education (MD cosign required). Inquired if patient would be willing to take a GLP-1 injection or a once a day basal insulin outpatient. Patient states that he would be willing to take either a GLP-1 or basal insulin as long as it was affordable.  Patient's wife states she takes Ozempic herself so she is very familiar with it.  Reviewed how to use an insulin pen, in case the GLP-1 was too expensive. Patient was able to demonstrate how to use an insulin pen successfully and reports it is similar to the Victoza pens he used in the past.  Patient verbalized understanding of information discussed and reports no further questions at this time related to diabetes. Asked Lyndel Safe, CPhT to do a benefit check to see cost of FIE-3(PIRJJOA and Trulicity) and basal insulin (Lantus). The Ozempic and Trulicity copay is $41 and Lantus is $35 copay. Spoke with patient over the phone  to inform him of copays and to see which he would prefer if affordable. Patient states that he would prefer to use Ozempic since it is $45 copay and that would be affordable for him.      Thanks, Barnie Alderman, RN, MSN, [place] Diabetes Coordinator Inpatient Diabetes Program [phone] (Team Pager from 8am to [place])

## 2022-05-09 NOTE — Progress Notes (Signed)
PROGRESS NOTE    Caleb [name]  ZMO:[id number] DOB: 11/12/1976 DOA: 05/08/2022 PCP: Venida Jarvis, MD    Brief Narrative:  46 year old male with history of hypertension, chronic pain syndrome, currently has a pain contract, hyperlipidemia, CAD and NSTEMI status post DES to LAD in 09/2019, history of provoked DVT after knee surgery, continued tobacco user, heavy alcohol use, chronic opioid use, PTSD, who presents emergency department for chief concerns of chest pain.  He reports squeezing chest tightness that is occasionally sharp.  He experiences radiation to  his jaw, left shoulder, and left upper extremity, with associated shortness of breath. He endorses shortness of breath with exertion. He states these symptoms are similar to prior episodes of chest pressure like in February of 2023 this year.   Troponins flat.  EKG reassuring.  Seen in consultation by cardiology.  Question regarding need for ischemic evaluation.  Patient ate today so we will monitor off heparin until 6/21 at which time cardiology will reevaluate need for ischemic work-up. Assessment & Plan:   Principal Problem:   Chest pain Active Problems:   Nicotine dependence, cigarettes, uncomplicated   OSA (obstructive sleep apnea)   Hypercholesteremia   Morbid obesity (HCC)   PTSD (post-traumatic stress disorder)  Chest pain History of CAD Chest pain unclear etiology.  Patient not a great historian but does endorse compliance with his medications.  States his wife controls his medications and he takes what ever she tells him to.  EDP initiated heparin bolus and gtt.  Subsequently discontinued by cardiology.  No clear signs of ACS/NSTEMI.  Blood pressure better controlled.  Plan: Continue aspirin, statin, beta-blocker Monitor in house overnight Continue telemetry EKG and troponin stat as needed chest pain Cardiology to see in a.m. and consider further inpatient ischemic evaluation at that time  Nicotine  dependence Nicotine patch  Chronic angina PTA Imdur, Ranexa Nitropaste as needed  Hypertension PTA metoprolol  Chronic pain PTA regimen resumed Patient on pain contract  DVT prophylaxis: SQ Lovenox Code Status: Full Family Communication: None today Disposition Plan: Status is: Observation The patient remains OBS appropriate and will d/c before 2 midnights.   Level of care: Telemetry Cardiac  Consultants:  Cardiology-[facility]  Procedures:  None  Antimicrobials: None   Subjective: Seen and examined.  Reports persistent left-sided anterior dull chest pressure.  No other complaints  Objective: Vitals:   05/08/22 2100 05/08/22 2258 05/09/22 0406 05/09/22 0626  BP: (!) 151/92 (!) 155/90 (!) 144/76 (!) 112/59  Pulse: 68 67 67 63  Resp:  '20 20 13  '$ Temp:  97.7 F (36.5 C) 97.7 F (36.5 C)   TempSrc:  Oral Oral   SpO2: 95% 96% 96% 97%  Weight:      Height:       No intake or output data in the 24 hours ending 05/09/22 1034 Filed Weights   05/08/22 1808  Weight: 108.9 kg    Examination:  General exam: Appears calm and comfortable  Respiratory system: Clear to auscultation. Respiratory effort normal. Cardiovascular system: S1-S2, RRR, no murmurs, no pedal edema Gastrointestinal system: Soft, NT/ND, normal bowel sounds Central nervous system: Alert and oriented. No focal neurological deficits. Extremities: Symmetric 5 x 5 power. Skin: No rashes, lesions or ulcers Psychiatry: Judgement and insight appear normal. Mood & affect appropriate.     Data Reviewed: I have personally reviewed following labs and imaging studies  CBC: Recent Labs  Lab 05/08/22 1820 05/09/22 0530  WBC 8.4 9.3  HGB 16.1 15.7  HCT  43.8 44.3  MCV 86.7 88.4  PLT 240 161   Basic Metabolic Panel: Recent Labs  Lab 05/08/22 1820 05/08/22 1825  NA 137  --   K 3.2*  --   CL 105  --   CO2 23  --   GLUCOSE 293*  --   BUN 11  --   CREATININE 0.62  --   CALCIUM 9.0  --    MG  --  1.9   GFR: Estimated Creatinine Clearance: 148.6 mL/min (by C-G formula based on SCr of 0.62 mg/dL). Liver Function Tests: No results for input(s): "AST", "ALT", "ALKPHOS", "BILITOT", "PROT", "ALBUMIN" in the last 168 hours. No results for input(s): "LIPASE", "AMYLASE" in the last 168 hours. No results for input(s): "AMMONIA" in the last 168 hours. Coagulation Profile: Recent Labs  Lab 05/09/22 0518  INR 1.0   Cardiac Enzymes: No results for input(s): "CKTOTAL", "CKMB", "CKMBINDEX", "TROPONINI" in the last 168 hours. BNP (last 3 results) No results for input(s): "PROBNP" in the last 8760 hours. HbA1C: Recent Labs    05/08/22 1825  HGBA1C 10.2*   CBG: No results for input(s): "GLUCAP" in the last 168 hours. Lipid Profile: No results for input(s): "CHOL", "HDL", "LDLCALC", "TRIG", "CHOLHDL", "LDLDIRECT" in the last 72 hours. Thyroid Function Tests: No results for input(s): "TSH", "T4TOTAL", "FREET4", "T3FREE", "THYROIDAB" in the last 72 hours. Anemia Panel: No results for input(s): "VITAMINB12", "FOLATE", "FERRITIN", "TIBC", "IRON", "RETICCTPCT" in the last 72 hours. Sepsis Labs: No results for input(s): "PROCALCITON", "LATICACIDVEN" in the last 168 hours.  No results found for this or any previous visit (from the past 240 hour(s)).       Radiology Studies: DG Chest 2 View  Result Date: 05/08/2022 CLINICAL DATA:  Chest pain EXAM: CHEST - 2 VIEW COMPARISON:  Chest x-ray 02/09/2022 FINDINGS: Heart size and mediastinal contours are within normal limits. Mild likely subsegmental atelectasis at the left lung base. No pleural effusion or pneumothorax visualized. No acute osseous abnormality appreciated. IMPRESSION: Mild likely subsegmental atelectasis at the left lung base. Electronically Signed   By: Ofilia Neas M.D.   On: 05/08/2022 18:38        Scheduled Meds:  aspirin EC  81 mg Oral Daily   atorvastatin  80 mg Oral Daily   insulin aspart  0-15 Units  Subcutaneous TID WC   insulin aspart  0-5 Units Subcutaneous QHS   isosorbide mononitrate  60 mg Oral Daily   [START ON 05/10/2022] metoprolol succinate  25 mg Oral Daily   nortriptyline  25 mg Oral QHS   ranolazine  500 mg Oral BID   Continuous Infusions:   LOS: 0 days     Sidney Ace, MD [facility]   If 7PM-7AM, please contact night-coverage  05/09/2022, 10:34 AM

## 2022-05-09 NOTE — TOC Benefit Eligibility Note (Signed)
Patient Teacher, English as a foreign language completed.    The patient is currently admitted and upon discharge could be taking Ozempic 0.25/0.5 pens.  The current 30 day co-pay is, $45.00.   The patient is currently admitted and upon discharge could be taking Trulicity pens.  The current 30 day co-pay is, $45.00.   The patient is currently admitted and upon discharge could be taking Lantus pens.  The current 30 day co-pay is, $35.00.   The patient is insured through [employer], [place] Patient Advocate Specialist [place] Patient Advocate Team Direct Number: [phone]  Fax: [phone]

## 2022-05-10 ENCOUNTER — Encounter: Payer: Self-pay | Admitting: Internal Medicine

## 2022-05-10 DIAGNOSIS — R079 Chest pain, unspecified: Secondary | ICD-10-CM | POA: Diagnosis not present

## 2022-05-10 LAB — MAGNESIUM: Magnesium: 2 mg/dL (ref 1.7–2.4)

## 2022-05-10 LAB — LIPID PANEL
Cholesterol: 207 mg/dL — ABNORMAL HIGH (ref 0–200)
HDL: 38 mg/dL — ABNORMAL LOW (ref >40)
LDL Cholesterol: UNDETERMINED mg/dL (ref 0–99)
Total CHOL/HDL Ratio: 5.4 RATIO
Triglycerides: 566 mg/dL — ABNORMAL HIGH (ref <150)
VLDL: UNDETERMINED mg/dL (ref 0–40)

## 2022-05-10 LAB — CBC
HCT: 41.7 % (ref 39.0–52.0)
Hemoglobin: 15.2 g/dL (ref 13.0–17.0)
MCH: 32 pg (ref 26.0–34.0)
MCHC: 36.5 g/dL — ABNORMAL HIGH (ref 30.0–36.0)
MCV: 87.8 fL (ref 80.0–100.0)
Platelets: 187 10*3/uL (ref 150–400)
RBC: 4.75 MIL/uL (ref 4.22–5.81)
RDW: 11.7 % (ref 11.5–15.5)
WBC: 8 10*3/uL (ref 4.0–10.5)
nRBC: 0 % (ref 0.0–0.2)

## 2022-05-10 LAB — POTASSIUM: Potassium: 3.5 mmol/L (ref 3.5–5.1)

## 2022-05-10 LAB — ETHANOL: Alcohol, Ethyl (B): <10 mg/dL (ref <10)

## 2022-05-10 LAB — GLUCOSE, CAPILLARY: Glucose-Capillary: 267 mg/dL — ABNORMAL HIGH (ref 70–99)

## 2022-05-10 LAB — LDL CHOLESTEROL, DIRECT: Direct LDL: 113.6 mg/dL — ABNORMAL HIGH (ref 0–99)

## 2022-05-10 MED ORDER — SEMAGLUTIDE(0.25 OR 0.5MG/DOS) 2 MG/3ML ~~LOC~~ SOPN: 0.2500 mg | PEN_INJECTOR | SUBCUTANEOUS | 0 refills | Status: AC

## 2022-05-10 NOTE — Progress Notes (Signed)
[place] NOTE       Patient ID: Caleb [name] MRN: [id number] DOB/AGE: 11/29/1975 46 y.o.  Admit date: 05/08/2022 Referring Physician Dr. Priscella Mann  Primary Physician [facility]  Primary Cardiologist Dr. Venetia Maxon  Reason for Consultation chest pain   HPI: Caleb [name] is a 46yoM with a PMH of CAD s/p PCI with DES x1 to mid LAD bifurcation 09/23/2019 and recent [place] 01/16/2022 at [facility] showing moderate CAD without clear intervention target, treated medically, tobacco abuse, poorly controlled type 2 diabetes, hypertension, hyperlipidemia, DDD on chronic opioids, anxiety, who presented to [facility] ED 05/09/2022 with chest discomfort.  Cardiology is consulted for further assistance.  Interval history - Feels well this morning. No chest pain or shortness of breath. - Eager to go home. States he does not want a stress test or CT scan completed d/t claustrophobia and would prefer to discuss with outpatient cardiologist.   Review of systems complete and found to be negative unless listed above     Past Medical History:  Diagnosis Date   Anxiety    Arthritis    Chronic pain    Diabetes mellitus without complication (HCC)    GERD (gastroesophageal reflux disease)    History of degenerative disc disease    Hypercholesteremia    Hypertension    Knee pain, bilateral    Myocardial infarct [facility])    PTSD (post-traumatic stress disorder)    Pulmonary embolism ([place])    Sleep apnea    Stroke ([place]) 2015   mold stroke per patient    Past Surgical History:  Procedure Laterality Date   ANGIOPLASTY / STENTING FEMORAL     2 stents   COLONOSCOPY WITH PROPOFOL N/A 05/07/2017   Procedure: COLONOSCOPY WITH PROPOFOL;  Surgeon: Lollie Sails, MD;  Location: [facility] ENDOSCOPY;  Service: Endoscopy;  Laterality: N/A;   COLONOSCOPY WITH PROPOFOL N/A 04/11/2021   Procedure: COLONOSCOPY WITH PROPOFOL;  Surgeon: Virgel Manifold, MD;  Location: [facility]  ENDOSCOPY;  Service: Endoscopy;  Laterality: N/A;   COLONOSCOPY WITH PROPOFOL N/A 03/21/2022   Procedure: COLONOSCOPY WITH PROPOFOL;  Surgeon: Lin Landsman, MD;  Location: [facility] ENDOSCOPY;  Service: Gastroenterology;  Laterality: N/A;   ESOPHAGOGASTRODUODENOSCOPY (EGD) WITH PROPOFOL N/A 05/07/2017   Procedure: ESOPHAGOGASTRODUODENOSCOPY (EGD) WITH PROPOFOL;  Surgeon: Lollie Sails, MD;  Location: [facility] ENDOSCOPY;  Service: Endoscopy;  Laterality: N/A;   HERNIA REPAIR     Knee surgeries     TONSILLECTOMY      Medications Prior to Admission  Medication Sig Dispense Refill Last Dose   atorvastatin (LIPITOR) 80 MG tablet Take 80 mg by mouth daily.   05/08/2022   cetirizine (ZYRTEC) 10 MG tablet Take 10 mg by mouth daily.   05/08/2022   clopidogrel (PLAVIX) 75 MG tablet Take 75 mg by mouth daily.   05/08/2022 at 2000   diphenhydrAMINE (BENADRYL) 25 mg capsule Take 50 mg by mouth every 6 (six) hours as needed for itching or allergies.   05/08/2022   esomeprazole (NEXIUM) 20 MG capsule Take 20 mg by mouth daily as needed.   05/08/2022   ezetimibe (ZETIA) 10 MG tablet Take 10 mg by mouth daily.   05/08/2022   glimepiride (AMARYL) 2 MG tablet Take 2-4 mg by mouth 2 (two) times daily.   05/08/2022   isosorbide mononitrate (IMDUR) 30 MG 24 hr tablet Take 1 tablet (30 mg total) by mouth daily. 30 tablet 0 05/08/2022   losartan-hydrochlorothiazide (HYZAAR) 100-25 MG tablet Take 1  tablet by mouth daily.   05/08/2022   metoprolol succinate (TOPROL-XL) 50 MG 24 hr tablet Take 50 mg by mouth daily.   05/08/2022   oxyCODONE (ROXICODONE) 15 MG immediate release tablet Take by mouth.   05/08/2022   Accu-Chek Softclix Lancets lancets 1 each 3 (three) times daily.      amLODipine (NORVASC) 5 MG tablet Take 5 mg by mouth daily.      aspirin EC 81 MG tablet Take 81 mg by mouth daily.    at prn   Blood Glucose Monitoring Suppl (ACCU-CHEK GUIDE ME) w/Device KIT See admin instructions.      Blood Glucose Monitoring  Suppl (FIFTY50 GLUCOSE METER 2.0) w/Device KIT Check once daily      Cysteamine Bitartrate (PROCYSBI) 300 MG PACK Use 1 each 3 (three) times daily (Patient not taking: Reported on 05/09/2022)   Not Taking   diazepam (VALIUM) 5 MG tablet Take 5 mg by mouth every 6 (six) hours as needed.    at prn   EPINEPHrine 0.3 mg/0.3 mL IJ SOAJ injection Inject into the muscle.    at prn   erythromycin ophthalmic ointment Place a 1/2 inch ribbon of ointment onto the upper eyelid margin. (Patient not taking: Reported on 05/09/2022) 3.5 g 0 Completed Course   glucose blood (FREESTYLE INSULINX TEST) test strip daily.      Lancets (FREESTYLE) lancets daily.      losartan (COZAAR) 100 MG tablet Take 100 mg by mouth daily. (Patient not taking: Reported on 05/09/2022)   Not Taking   MONTELUKAST SODIUM PO Take 1 tablet by mouth daily. (Patient not taking: Reported on 07/12/2020)      nortriptyline (PAMELOR) 25 MG capsule Take 25 mg by mouth at bedtime.      ranolazine (RANEXA) 500 MG 12 hr tablet Take 500 mg by mouth 2 (two) times daily. (Patient not taking: Reported on 02/10/2022)   Not Taking    Social History   Socioeconomic History   Marital status: Married    Spouse name: Not on file   Number of children: Not on file   Years of education: Not on file   Highest education level: Not on file  Occupational History   Not on file  Tobacco Use   Smoking status: Every Day    Packs/day: 1.00    Years: 20.00    Total pack years: 20.00    Types: Cigarettes    Last attempt to quit: 04/21/2022    Years since quitting: 0.0   Smokeless tobacco: Never  Vaping Use   Vaping Use: Never used  Substance and Sexual Activity   Alcohol use: Not Currently    Alcohol/week: 4.0 standard drinks of alcohol    Types: 4 Cans of beer per week    Comment: occas.    Drug use: Not Currently   Sexual activity: Yes  Other Topics Concern   Not on file  Social History Narrative   Not on file   Social Determinants of Health    Financial Resource Strain: Not on file  Food Insecurity: Not on file  Transportation Needs: Not on file  Physical Activity: Not on file  Stress: Not on file  Social Connections: Not on file  Intimate Partner Violence: Not on file    Family History  Problem Relation Age of Onset   Diabetes Mother    Cancer Paternal Grandmother    Cancer Paternal Grandfather    Heart attack Maternal Grandfather 61  PHYSICAL EXAM General: Caucasian male sitting upright in hospital bed, well nourished, in no acute distress. HEENT:  Normocephalic and atraumatic.   Neck:   No JVD.  Lungs: Normal respiratory effort on room air. Clear bilaterally to auscultation. No wheezes, crackles, rhonchi.  Heart: HRRR . Normal S1 and S2 without gallops or murmurs. Abdomen: Obese appearing.  Msk: Normal strength and tone for age. Extremities: Warm and well perfused. No clubbing, cyanosis.  Trace bilateral lower extremity edema.  Neuro: Alert and oriented X 3. Psych:  Answers questions appropriately.   Labs:   Lab Results  Component Value Date   WBC 8.0 05/10/2022   HGB 15.2 05/10/2022   HCT 41.7 05/10/2022   MCV 87.8 05/10/2022   PLT 187 05/10/2022    Recent Labs  Lab 05/08/22 1820  NA 137  K 3.2*  CL 105  CO2 23  BUN 11  CREATININE 0.62  CALCIUM 9.0  GLUCOSE 293*    Lab Results  Component Value Date   CKTOTAL 76 05/08/2012   CKMB < 0.5 (L) 05/08/2012   TROPONINI <0.03 07/01/2018    Lab Results  Component Value Date   CHOL 207 (H) 05/09/2022   CHOL 228 (H) 07/01/2018   CHOL 256 (H) 11/10/2014   Lab Results  Component Value Date   HDL 38 (L) 05/09/2022   HDL 36 (L) 07/01/2018   HDL 55 11/10/2014   Lab Results  Component Value Date   LDLCALC UNABLE TO CALCULATE IF TRIGLYCERIDE OVER 400 mg/dL 05/09/2022   LDLCALC 140 (H) 07/01/2018   LDLCALC 180 (H) 11/10/2014   Lab Results  Component Value Date   TRIG 566 (H) 05/09/2022   TRIG 259 (H) 07/01/2018   TRIG 107 11/10/2014    Lab Results  Component Value Date   CHOLHDL 5.4 05/09/2022   CHOLHDL 6.3 07/01/2018   Lab Results  Component Value Date   LDLDIRECT 113.6 (H) 05/09/2022      Radiology: DG Chest 2 View  Result Date: 05/08/2022 CLINICAL DATA:  Chest pain EXAM: CHEST - 2 VIEW COMPARISON:  Chest x-ray 02/09/2022 FINDINGS: Heart size and mediastinal contours are within normal limits. Mild likely subsegmental atelectasis at the left lung base. No pleural effusion or pneumothorax visualized. No acute osseous abnormality appreciated. IMPRESSION: Mild likely subsegmental atelectasis at the left lung base. Electronically Signed   By: Ofilia Neas M.D.   On: 05/08/2022 18:38    LHC 01/16/22 by Dr. Baron Hamper Moderate CAD, with no clear intervention target  He was extremely anxious about the entire procedure, including the camera being close to his face  Left main was difficult to engage and did dampen, but appearance is similar to 2021 when iFR was done and was normal  No other changes in coronary anatomy since 2021   Recommendations:   Medical therapy for CAD  If refractory symptoms, he should be considered for CT-FFR or adenosine cMRI (assess ischemia in LM)  Sheath out, TR band applied    LHC 09/23/2019 RCA & LCx without significant disease   Ostial LM with vasospasm and 20% plaque-- confirmed with IC nitro and IVUS  MidLAD bifurcation lesion with D2 -- 70-80% (IFR 0.88) -- IVUS for size   Lesion #1. 70-80% midLAD  JL 4 guide with SH  IVUS for size -- heavily calcified lesion  IFR 0.88  2.70m pre-dilation  2.75x14mXience [place] (DES)  Post-dilated with 2.75x8m37mC balloon at high pressure  Excellent result with TIMI 3 flow in D2  Moderate sedation with IV benadryl, fentanyl, and versed personally supervised by me for a total of more than 20 minutes   No sedation issues  No complications  Minimal blood loss  Recommendations:   To holding in stable condition with TR band in place    Plavix 73m daily for 1 year  Aspirin 834mdaily for lifetime  High dose statin  B-blocker  Cardiac rehab.    ECHO 01/14/2022 NORMAL LEFT VENTRICULAR SYSTOLIC FUNCTION WITH MILD LVH    NORMAL RIGHT VENTRICULAR SYSTOLIC FUNCTION    VALVULAR REGURGITATION: TRIVIAL AR    NO VALVULAR STENOSIS    3D acquisition and reconstructions were performed as part of this    examination to more accurately quantify the effects of heart failure    regardless of ejection fraction.   Compared with prior Echo study on 10/21/2020: NO SIGNIFICANT CHANGE   TELEMETRY reviewed by me: Sinus rhythm rate 60s to 80s  EKG reviewed by me: Sinus rhythm rate 72  ASSESSMENT AND PLAN:  Caleb SeveranceBaCorradis a [age]yoMith a PMH of CAD s/p PCI with DES x1 to mid LAD bifurcation 09/23/2019 and [facility] without clear intervention target, treated medically, tobacco abuse, poorly controlled type 2 diabetes, hypertension, hyperlipidemia, DDD on chronic opioids, anxiety, [facility] with [facility] discomfort.  Cardiology is consulted for further assistance.  #Chest pain-DDx hypertensive urgency vs cardiac chest pain vs noncardiac chest pain Patient presents with a single episode of 10 out of 10 central chest tightness and pressure that started after he finished driving his car with associated nausea and lightheadedness but no vomiting.  Somewhat relieved with nitroglycerin and eventually relieved with narcotics.  Notably, his blood pressure was severely elevated per EMS of 200/140, although he reports compliance with his medications (but cannot name them).  Troponins are negative on 2 repeats and his EKG is without acute ischemic changes.  He has notably had multiple heart caths over the past few years and has had difficulty with claustrophobia and the camera being close to his face.  We have offered him a coronary CTA and a nuclear medicine stress testing, which all  involve some degree of lying flat and in imaging scanner which the patient strongly prefers to avoid due to claustrophobia as well. - Patient again declines ischemic evaluation and is able to state the risks associated with this. We will continue medical therapy with close outpatient followup. He states he will call today for an appointment within 1 week. -Continue 81 mg aspirin daily -Continue home medicines: Losartan 100-HCTZ 25, Imdur 30 mg, Ranexa 500 mg twice daily atorvastatin 8066mRestart metoprolol XL (was previously on 20m41mily)   Signed: RYANAndrez GrimeD 05/10/2022, 9:28 AM [facility]

## 2022-05-10 NOTE — Discharge Summary (Signed)
Physician Discharge Summary   Patient: Caleb [name] MRN: [id number] DOB: Jun 22, 1976  Admit date:     05/08/2022  Discharge date: 05/10/22  Discharge Physician: Jennye Boroughs   PCP: Venida Jarvis, MD   Recommendations at discharge:   Follow-up with PCP in 1 week  Follow-up with your cardiologist as soon as possible for further evaluation/recommendations  Discharge Diagnoses: Principal Problem:   Chest pain Active Problems:   Nicotine dependence, cigarettes, uncomplicated   OSA (obstructive sleep apnea)   Hypercholesteremia   Morbid obesity ([place])   PTSD (post-traumatic stress disorder)  Resolved Problems:   * No resolved hospital problems. [facility] Course: Caleb [name] is a 46 year old male with history of hypertension, chronic pain syndrome, currently has a pain contract, hyperlipidemia, CAD and NSTEMI status post DES to LAD in 09/2019, history of provoked DVT after knee surgery, continued tobacco user, history of alcohol use disorder (said he quit drinking many months ago), chronic opioid use, PTSD.  He presented to the hospital with chest pain.  Troponins and D-dimer were negative.  He was admitted to the hospital for observation.  Chest pain was relieved with nitroglycerin and analgesics.  He was evaluated by the cardiologist.  Coronary CTA and nuclear stress test were offered but patient declined because of claustrophobia.  He feels better and he prefers to follow-up with his cardiologist as an outpatient.  He has type 2 diabetes mellitus with hyperglycemia and hemoglobin A1c was 10.2.  He was discharged on Ozempic.  He is deemed stable for discharge to home today.      Consultants: Cardiologist Procedures performed: None Disposition: Home Diet recommendation:  Discharge Diet Orders (From admission, onward)     Start     Ordered   05/10/22 0000  Diet - low sodium heart healthy        05/10/22 1009   05/10/22 0000  Diet Carb Modified         05/10/22 1009           Cardiac and Carb modified diet DISCHARGE MEDICATION: Allergies as of 05/10/2022       Reactions   Bee Venom Anaphylaxis   Iodinated Contrast Media Anaphylaxis, Swelling   Metformin And Related Anaphylaxis   Unknown   Lisinopril Swelling        Medication List     STOP taking these medications    erythromycin ophthalmic ointment   losartan 100 MG tablet Commonly known as: COZAAR   MONTELUKAST SODIUM PO   Procysbi 300 MG Pack Generic drug: Cysteamine Bitartrate       TAKE these medications    Fifty50 Glucose Meter 2.0 w/Device Kit Check once daily   Accu-Chek Guide Me w/Device Kit See admin instructions.   freestyle lancets daily.   Accu-Chek Softclix Lancets lancets 1 each 3 (three) times daily.   amLODipine 5 MG tablet Commonly known as: NORVASC Take 5 mg by mouth daily.   aspirin EC 81 MG tablet Take 81 mg by mouth daily.   atorvastatin 80 MG tablet Commonly known as: LIPITOR Take 80 mg by mouth daily.   cetirizine 10 MG tablet Commonly known as: ZYRTEC Take 10 mg by mouth daily.   clopidogrel 75 MG tablet Commonly known as: PLAVIX Take 75 mg by mouth daily.   diazepam 5 MG tablet Commonly known as: VALIUM Take 5 mg by mouth every 6 (six) hours as needed.   diphenhydrAMINE 25 mg capsule Commonly known as: BENADRYL Take 50 mg by mouth every  6 (six) hours as needed for itching or allergies.   EPINEPHrine 0.3 mg/0.3 mL Soaj injection Commonly known as: EPI-PEN Inject into the muscle.   esomeprazole 20 MG capsule Commonly known as: NEXIUM Take 20 mg by mouth daily as needed.   ezetimibe 10 MG tablet Commonly known as: ZETIA Take 10 mg by mouth daily.   FreeStyle [employer] Test test strip Generic drug: glucose blood daily.   glimepiride 2 MG tablet Commonly known as: AMARYL Take 2-4 mg by mouth 2 (two) times daily.   isosorbide mononitrate 30 MG 24 hr tablet Commonly known as: IMDUR Take 1 tablet  (30 mg total) by mouth daily.   losartan-hydrochlorothiazide 100-25 MG tablet Commonly known as: HYZAAR Take 1 tablet by mouth daily.   metoprolol succinate 50 MG 24 hr tablet Commonly known as: TOPROL-XL Take 50 mg by mouth daily.   nortriptyline 25 MG capsule Commonly known as: PAMELOR Take 25 mg by mouth at bedtime.   oxyCODONE 15 MG immediate release tablet Commonly known as: ROXICODONE Take by mouth.   ranolazine 500 MG 12 hr tablet Commonly known as: RANEXA Take 500 mg by mouth 2 (two) times daily.   Semaglutide(0.25 or 0.5MG/DOS) 2 MG/3ML Sopn Inject 0.25 mg into the skin once a week.        Discharge Exam: Filed Weights   05/08/22 1808 05/09/22 2302 05/09/22 2305  Weight: 108.9 kg 104.9 kg 106.3 kg   GEN: NAD SKIN: No rash EYES: EOMI ENT: MMM CV: RRR PULM: CTA B ABD: soft, obese, NT, +BS CNS: AAO x 3, non focal EXT: No edema or tenderness   Condition at discharge: good  The results of significant diagnostics from this hospitalization (including imaging, microbiology, ancillary and laboratory) are listed below for reference.   Imaging Studies: DG Chest 2 View  Result Date: 05/08/2022 CLINICAL DATA:  Chest pain EXAM: CHEST - 2 VIEW COMPARISON:  Chest x-ray 02/09/2022 FINDINGS: Heart size and mediastinal contours are within normal limits. Mild likely subsegmental atelectasis at the left lung base. No pleural effusion or pneumothorax visualized. No acute osseous abnormality appreciated. IMPRESSION: Mild likely subsegmental atelectasis at the left lung base. Electronically Signed   By: Ofilia Neas M.D.   On: 05/08/2022 18:38    Microbiology: Results for orders placed or performed during the hospital encounter of 03/18/21  SARS CORONAVIRUS 2 (TAT 6-24 HRS) Nasopharyngeal Nasopharyngeal Swab     Status: None   Collection Time: 03/18/21 10:50 PM   Specimen: Nasopharyngeal Swab  Result Value Ref Range Status   SARS Coronavirus 2 NEGATIVE NEGATIVE Final     Comment: (NOTE) SARS-CoV-2 target nucleic acids are NOT DETECTED.  The SARS-CoV-2 RNA is generally detectable in upper and lower respiratory specimens during the acute phase of infection. Negative results do not preclude SARS-CoV-2 infection, do not rule out co-infections with other pathogens, and should not be used as the sole basis for treatment or other patient management decisions. Negative results must be combined with clinical observations, patient history, and epidemiological information. The expected result is Negative.  Fact Sheet for Patients: SugarRoll.be  Fact Sheet for Healthcare Providers: https://www.woods-mathews.com/  This test is not yet approved or cleared by the [place] FDA and  has been authorized for detection and/or diagnosis of SARS-CoV-2 by FDA under an Emergency Use Authorization (EUA). This EUA will remain  in effect (meaning this test can be used) for the duration of the COVID-19 declaration under Se ction 564(b)(1) of the Act, 21 U.S.C. section 360bbb-3(b)(1),  unless the authorization is terminated or revoked sooner.  Performed at [facility], [place] [address], [place], [place] [address]     Labs: CBC: Recent Labs  Lab 05/08/22 1820 05/09/22 0530 05/10/22 0614  WBC 8.4 9.3 8.0  HGB 16.1 15.7 15.2  HCT 43.8 44.3 41.7  MCV 86.7 88.4 87.8  PLT 240 205 094   Basic Metabolic Panel: Recent Labs  Lab 05/08/22 1820 05/08/22 1825 05/10/22 0614  NA 137  --   --   K 3.2*  --  3.5  CL 105  --   --   CO2 23  --   --   GLUCOSE 293*  --   --   BUN 11  --   --   CREATININE 0.62  --   --   CALCIUM 9.0  --   --   MG  --  1.9 2.0   Liver Function Tests: No results for input(s): "AST", "ALT", "ALKPHOS", "BILITOT", "PROT", "ALBUMIN" in the last 168 hours. CBG: Recent Labs  Lab 05/09/22 1711 05/09/22 2229 05/09/22 2334 05/10/22 0818  GLUCAP 248* 377* 312* 267*    Discharge time spent:  greater than 30 minutes.  Signed: Jennye Boroughs, MD [facility] 05/10/2022

## 2022-05-10 NOTE — Progress Notes (Signed)
When asked about the location of his ride home by this RN, the patient stated he "had to meet him at the gas station by the fish restaurant". Patient was alert and oriented, stable while ambulating, and in stable condition at time of discharge. This RN walked patient off of unit after discharge instructions were discussed.

## 2022-05-10 NOTE — Progress Notes (Signed)
Received pt from ED, pt denies chest pain pressure during the night. Medicated several times for chronic back pain. Pt NPO per MD order. Vss. Blood sugar elevated, s/s coverage given prior to arrival. SRP, RN

## 2022-05-10 NOTE — Plan of Care (Signed)
  RD consulted for nutrition education regarding diabetes.   Lab Results  Component Value Date   HGBA1C 10.2 (H) 05/08/2022   PTA DM medications are 4 mg amaryl daily.   Labs reviewed: 248-377 (inpatient orders for glycemic control are 0-15 units insulin aspart TID with meals and 0-5 units insulin aspart daily at bedtime).    Spoke with pt, who was sitting in recliner chair at time of visit. Pt shares that he is excited to discharge home today. He notes his blood sugars are running higher in the hospital, but usually run from 140-200's at home. Pt reports he generally consumes 3 meals per day, which consist of meat and vegetables. He shares he really enjoys grilling foods on his [employer] and his wife also assists with grocery shopping and preparing meals. He admits to snacking on popcorn occasionally. He drinks mainly water with unsweetened flavoring packets and an occasional regular soda ("I can't do the diet"). Reviewed importance of choosing lower calorie beverages most often and provided examples of these. Pt has no further questions regarding his DM or management, but appreciative of RD visit. His wife manages his medications at home.    RD provided "Carbohydrate Counting for People with Diabetes" handout from the Academy of Nutrition and Dietetics. Discussed different food groups and their effects on blood sugar, emphasizing carbohydrate-containing foods. Provided list of carbohydrates and recommended serving sizes of common foods.  Discussed importance of controlled and consistent carbohydrate intake throughout the day. Provided examples of ways to balance meals/snacks and encouraged intake of high-fiber, whole grain complex carbohydrates. Teach back method used.  Expect fair to good compliance.  Body mass index is 31.78 kg/m. Pt meets criteria for obesity, class I based on current BMI. Obesity is a complex, chronic medical condition that is optimally managed by a multidisciplinary  care team. Weight loss is not an ideal goal for an acute inpatient hospitalization. However, if further work-up for obesity is warranted, consider outpatient referral to outpatient bariatric service and/or [facility].    Current diet order is carb modified, patient is consuming approximately n/a% of meals at this time. Labs and medications reviewed. No further nutrition interventions warranted at this time. RD contact information provided. If additional nutrition issues arise, please re-consult RD.  Loistine Chance, RD, LDN, [place] Registered Dietitian II Certified Diabetes Care and Education Specialist Please refer to [facility] for RD and/or RD on-call/weekend/after hours pager

## 2022-05-10 NOTE — TOC Transition Note (Signed)
Transition of Care [facility]) - CM/SW Discharge Note   Patient Details  Name: Caleb [name] MRN: [id number] Date of Birth: 06-Jun-1976  [facility] CM/SW Contact:  Donnelly Angelica, LCSW Phone Number: 05/10/2022, 10:20 AM   Clinical Narrative:    Pt has orders to discharge today. No further concerns. CSW signing off.    Final next level of care: Home/Self Care Barriers to Discharge: Barriers Resolved   Patient Goals and CMS Choice        Discharge Placement                    Patient and family notified of of transfer: 05/10/22  Discharge Plan and Services                                     Social Determinants of Health (SDOH) Interventions     Readmission Risk Interventions     No data to display

## 2022-05-10 NOTE — Plan of Care (Signed)
  Problem: Education: Goal: Understanding of cardiac disease, CV risk reduction, and recovery process will improve Outcome: Progressing Goal: Individualized Educational Video(s) Outcome: Progressing   Problem: Activity: Goal: Ability to tolerate increased activity will improve Outcome: Progressing   

## 2022-05-10 NOTE — Discharge Instructions (Signed)

## 2022-05-10 NOTE — Inpatient Diabetes Management (Addendum)
Inpatient Diabetes Program Recommendations  AACE/ADA: New Consensus Statement on Inpatient Glycemic Control   Target Ranges:  Prepandial:   less than 140 mg/dL      Peak postprandial:   less than 180 mg/dL (1-2 hours)      Critically ill patients:  140 - 180 mg/dL    Latest Reference Range & Units 05/10/22 08:18  Glucose-Capillary 70 - 99 mg/dL 267 (H)    Latest Reference Range & Units 02/10/22 08:24 02/10/22 11:40 03/21/22 10:10 05/09/22 17:11 05/09/22 22:29 05/09/22 23:34  Glucose-Capillary 70 - 99 mg/dL 215 (H) 274 (H) 189 (H) 248 (H) 377 (H) 312 (H)    Latest Reference Range & Units 02/10/22 01:56 05/08/22 18:25  Hemoglobin A1C 4.8 - 5.6 % 9.4 (H) 10.2 (H)   Review of Glycemic Control  Diabetes history: DM2 Outpatient Diabetes medications: Amaryl 4 mg BID Current orders for Inpatient glycemic control: Novolog 0-15 units TID with meals, Novolog 0-5 units QHS   Inpatient Diabetes Program Recommendations:     Insulin: Please consider ordering Semglee 10 units Q24H.   HbgA1C:  A1C 10.2% on 05/08/22 indicating an average glucose of 246 mg/dl over the past 2-3 months.    Outpatient DM medications: Patient had been on Victoza in past but it was stopped due to cost.  At discharge, please consider providing Rx for Ozempic 0.25 mg once a week (copay is $45 and patient states that is affordable).  Thanks, Barnie Alderman, RN, MSN, [place] Diabetes Coordinator Inpatient Diabetes Program [phone] (Team Pager from 8am to [place])

## 2022-06-10 ENCOUNTER — Encounter: Payer: Self-pay | Admitting: Emergency Medicine

## 2022-06-10 ENCOUNTER — Ambulatory Visit
Admission: EM | Admit: 2022-06-10 | Discharge: 2022-06-10 | Disposition: A | Discharge status: Home / Self Care | Payer: Medicare HMO | Attending: Internal Medicine | Admitting: Internal Medicine

## 2022-06-10 DIAGNOSIS — S70262A Insect bite (nonvenomous), left hip, initial encounter: Secondary | ICD-10-CM | POA: Diagnosis not present

## 2022-06-10 DIAGNOSIS — W57XXXA Bitten or stung by nonvenomous insect and other nonvenomous arthropods, initial encounter: Secondary | ICD-10-CM

## 2022-06-10 DIAGNOSIS — L02416 Cutaneous abscess of left lower limb: Secondary | ICD-10-CM

## 2022-06-10 DIAGNOSIS — B9689 Other specified bacterial agents as the cause of diseases classified elsewhere: Secondary | ICD-10-CM

## 2022-06-10 DIAGNOSIS — H109 Unspecified conjunctivitis: Secondary | ICD-10-CM | POA: Diagnosis not present

## 2022-06-10 MED ORDER — POLYMYXIN B-TRIMETHOPRIM 10000-0.1 UNIT/ML-% OP SOLN: 2.0000 [drp] | Freq: Three times a day (TID) | OPHTHALMIC | 0 refills | Status: AC

## 2022-06-10 MED ORDER — DOXYCYCLINE HYCLATE 100 MG PO CAPS: 100.0000 mg | ORAL_CAPSULE | Freq: Two times a day (BID) | ORAL | 0 refills | Status: DC

## 2022-06-10 MED ORDER — NYSTATIN 100000 UNIT/ML MT SUSP: 5.0000 mL | Freq: Three times a day (TID) | OROMUCOSAL | 0 refills | Status: AC

## 2022-06-10 NOTE — ED Triage Notes (Signed)
Pt c/o tick bite on left side around his waist line. He states there is a golf ball sized "bump" with a rash around it. He states he pulled it off about 2 days ago. He also c/o headaches, eye redness and matting. Denies fever.

## 2022-06-10 NOTE — ED Provider Notes (Signed)
[facility]    CSN: [id number] Arrival date & time: 06/10/22  1137      History   Chief Complaint Chief Complaint  Patient presents with   Tick Removal    HPI Caleb [name] is a 46 y.o. male who presents with tick bite on his L hip 2 days ago. Not has redness and rash on site. Has been having HA's x 1 days. eye redness and matting of his both eyes since this am. He does not know how long she had it since when he found the tick it was very engorged and embedded deep. He made sure to take out the head. Denies fever, joint swelling or aches. He has not taken his BP med this am.     Past Medical History:  Diagnosis Date   Anxiety    Arthritis    Chronic pain    Diabetes mellitus without complication (HCC)    GERD (gastroesophageal reflux disease)    History of degenerative disc disease    Hypercholesteremia    Hypertension    Knee pain, bilateral    Myocardial infarct [facility])    PTSD (post-traumatic stress disorder)    Pulmonary embolism ([place])    Sleep apnea    Stroke ([place]) 2015   mold stroke per patient    Patient Active Problem List   Diagnosis Date Noted   PTSD (post-traumatic stress disorder) 01/24/2022   History of colonic polyps    Polyp of colon    Diabetic polyneuropathy associated with type 2 diabetes mellitus ([place]) 03/03/2021   Chronic pain syndrome 12/16/2020   History of alcohol abuse 09/30/2019   Coronary artery disease involving native coronary artery of native heart 09/23/2019   S/P drug eluting coronary stent placement 09/23/2019   Steroid-induced hyperglycemia 09/23/2019   Chronic, continuous use of opioids 09/22/2019   Heavy alcohol use 09/22/2019   Hyperglycemia 09/22/2019   Memory difficulties 09/22/2019   Psychophysiological insomnia 03/06/2019   Leukocytosis 07/01/2018   Chest pain 06/30/2018   Hypertensive urgency 06/30/2018   Hypokalemia 06/30/2018   Fall 06/30/2018   Anxiety    Hypercholesteremia    Stroke-like  symptom 10/16/2017   Gastritis, erosive 05/21/2017   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   Uncontrolled type 2 diabetes mellitus with hyperglycemia, without long-term current use of insulin ([place]) 03/18/2017   Vitamin D deficiency 03/18/2017   LVH (left ventricular hypertrophy) due to hypertensive disease, without heart failure 01/29/2017   Bee sting allergy 06/13/2016   Chronic midline low back pain without sciatica 06/13/2016   Kidney stone 06/12/2016   History of colonic diverticulitis 03/07/2016   Diverticulitis 02/26/2016   Diverticulitis of large intestine without perforation or abscess without bleeding    Hemiplegic migraine without status migrainosus, 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/21/2015   OSA (obstructive sleep apnea) 06/21/2015   Dental caries, unspecified 06/24/2014   Stroke ([place]) 93/81/8299   Alcoholic liver disease, unspecified ([place]) 05/21/2013   Allergy to radiographic contrast media 05/21/2013   Chronic liver disease 05/21/2013   High risk medications (not anticoagulants) long-term use 02/27/2013   BMI 32.0-32.9,adult 12/12/2012   Morbid obesity ([place]) 12/12/2012   Personal history of combat and operational stress reaction 02/22/2012   Primary localized osteoarthrosis, lower leg 12/04/2011   Health care maintenance 07/26/2010   Nicotine dependence, cigarettes, uncomplicated 37/16/9678   Asthma 03/24/2009   History of pulmonary embolus (PE) 03/24/2009   Essential hypertension 01/12/2009   Gastroesophageal reflux disease without esophagitis 06/22/2004    Past Surgical History:  Procedure Laterality Date   ANGIOPLASTY / STENTING FEMORAL     2 stents   COLONOSCOPY WITH PROPOFOL N/A 05/07/2017   Procedure: COLONOSCOPY WITH PROPOFOL;  Surgeon: Lollie Sails, MD;  Location: [facility] ENDOSCOPY;  Service: Endoscopy;  Laterality: N/A;   COLONOSCOPY WITH PROPOFOL N/A 04/11/2021   Procedure: COLONOSCOPY WITH PROPOFOL;  Surgeon: Virgel Manifold, MD;  Location: [facility] ENDOSCOPY;   Service: Endoscopy;  Laterality: N/A;   COLONOSCOPY WITH PROPOFOL N/A 03/21/2022   Procedure: COLONOSCOPY WITH PROPOFOL;  Surgeon: Lin Landsman, MD;  Location[facility] ENDOSCOPY;  Service: Gastroenterology;  Laterality: N/A;   ESOPHAGOGASTRODUODENOSCOPY (EGD) WITH PROPOFOL N/A 05/07/2017   Procedure: ESOPHAGOGASTRODUODENOSCOPY (EGD) WITH PROPOFOL;  Surgeon: Lollie Sails, MD;  Location: [facility] ENDOSCOPY;  Service: Endoscopy;  Laterality: N/A;   HERNIA REPAIR     Knee surgeries     TONSILLECTOMY         Home Medications    Prior to Admission medications   Medication Sig Start Date End Date Taking? Authorizing Provider  aspirin EC 81 MG tablet Take 81 mg by mouth daily.   Yes [provider]  atorvastatin (LIPITOR) 80 MG tablet Take 80 mg by mouth daily. 06/05/21 06/10/22 Yes [provider]  clopidogrel (PLAVIX) 75 MG tablet Take 75 mg by mouth daily. 06/14/20  Yes [provider]  diazepam (VALIUM) 5 MG tablet Take 5 mg by mouth every 6 (six) hours as needed. 03/20/22  Yes [provider]  doxycycline (VIBRAMYCIN) 100 MG capsule Take 1 capsule (100 mg total) by mouth 2 (two) times daily. 06/10/22  Yes Rodriguez-Southworth, Sunday Spillers, PA-C  esomeprazole (NEXIUM) 20 MG capsule Take 20 mg by mouth daily as needed.   Yes [provider]  ezetimibe (ZETIA) 10 MG tablet Take 10 mg by mouth daily. 01/18/22 01/18/23 Yes [provider]  glimepiride (AMARYL) 2 MG tablet Take 2-4 mg by mouth 2 (two) times daily. 01/17/22  Yes [provider]  glucose blood (FREESTYLE INSULINX TEST) test strip daily. 04/25/21  Yes [provider]  isosorbide mononitrate (IMDUR) 30 MG 24 hr tablet Take 1 tablet (30 mg total) by mouth daily. 02/11/22 06/10/22 Yes Wyvonnia Dusky, MD  losartan-hydrochlorothiazide (HYZAAR) 100-25 MG tablet Take 1 tablet by mouth daily. 03/20/22 03/20/23 Yes [provider]  metoprolol succinate (TOPROL-XL) 50 MG 24  hr tablet Take 50 mg by mouth daily. 12/24/17  Yes [provider]  nortriptyline (PAMELOR) 25 MG capsule Take 25 mg by mouth at bedtime. 10/25/21  Yes [provider]  nystatin (MYCOSTATIN) 100000 UNIT/ML suspension Take 5 mLs (500,000 Units total) by mouth in the morning, at noon, and at bedtime. For 7 days, then prn recurrence 06/10/22  Yes Rodriguez-Southworth, Sunday Spillers, PA-C  ranolazine (RANEXA) 500 MG 12 hr tablet Take 500 mg by mouth 2 (two) times daily. 01/17/22 01/17/23 Yes [provider]  trimethoprim-polymyxin b (POLYTRIM) ophthalmic solution Place 2 drops into both eyes 3 (three) times daily. For 7 days 06/10/22  Yes Rodriguez-Southworth, Sunday Spillers, PA-C  Accu-Chek Softclix Lancets lancets 1 each 3 (three) times daily. 01/17/22   [provider]  amLODipine (NORVASC) 5 MG tablet Take 5 mg by mouth daily. 12/17/20 02/10/22  [provider]  Blood Glucose Monitoring Suppl (ACCU-CHEK GUIDE ME) w/Device KIT See admin instructions. 01/17/22   [provider]  Blood Glucose Monitoring Suppl (FIFTY50 GLUCOSE METER 2.0) w/Device KIT Check once daily 10/28/21 10/28/22  [provider]  cetirizine (ZYRTEC) 10 MG tablet Take 10  mg by mouth daily.    [provider]  diphenhydrAMINE (BENADRYL) 25 mg capsule Take 50 mg by mouth every 6 (six) hours as needed for itching or allergies.    [provider]  EPINEPHrine 0.3 mg/0.3 mL IJ SOAJ injection Inject into the muscle. 09/24/19   [provider]  Lancets (FREESTYLE) lancets daily. 04/25/21   [provider]  Semaglutide,0.25 or 0.5MG/DOS, 2 MG/3ML SOPN Inject 0.25 mg into the skin once a week. 05/10/22   Jennye Boroughs, MD    Family History Family History  Problem Relation Age of Onset   Diabetes Mother    Cancer Paternal Grandmother    Cancer Paternal Grandfather    Heart attack Maternal Grandfather 35    Social History Social History   Tobacco Use   Smoking  status: Every Day    Packs/day: 1.00    Years: 20.00    Total pack years: 20.00    Types: Cigarettes    Last attempt to quit: 04/21/2022    Years since quitting: 0.1   Smokeless tobacco: Never  Vaping Use   Vaping Use: Never used  Substance Use Topics   Alcohol use: Not Currently    Alcohol/week: 4.0 standard drinks of alcohol    Types: 4 Cans of beer per week    Comment: occas.    Drug use: Not Currently     Allergies   Bee venom, Iodinated contrast media, Metformin and related, and Lisinopril   Review of Systems Review of Systems  Constitutional:  Negative for chills, diaphoresis, fatigue and fever.  HENT:  Negative for congestion.   Eyes:  Positive for discharge, redness and itching. Negative for pain.  Respiratory:  Negative for cough.   Musculoskeletal:  Negative for arthralgias, joint swelling and myalgias.  Skin:  Positive for rash. Negative for wound.  Neurological:  Positive for headaches.     Physical Exam Triage Vital Signs ED Triage Vitals  Enc Vitals Group     BP 06/10/22 1224 (!) 163/108     Pulse Rate 06/10/22 1224 74     Resp 06/10/22 1224 16     Temp 06/10/22 1224 98.2 F (36.8 C)     Temp Source 06/10/22 1224 Oral     SpO2 06/10/22 1224 97 %     Weight 06/10/22 1223 234 lb 5.6 oz (106.3 kg)     Height 06/10/22 1223 6' (1.829 m)     Head Circumference --      Peak Flow --      Pain Score 06/10/22 1222 3     Pain Loc --      Pain Edu? --      Excl. in [place]? --    No data found.  Updated Vital Signs BP (!) 146/101 (BP Location: Right Arm)   Pulse 74   Temp 98.2 F (36.8 C) (Oral)   Resp 16   Ht 6' (1.829 m)   Wt 234 lb 5.6 oz (106.3 kg)   SpO2 97%   BMI 31.78 kg/m   Visual Acuity Right Eye Distance:   Left Eye Distance:   Bilateral Distance:    Right Eye Near:   Left Eye Near:    Bilateral Near:     Physical Exam Vitals and nursing note reviewed.  Constitutional:      General: He is not in acute distress.    Appearance: He  is obese. He is not toxic-appearing.  HENT:     Right Ear: External ear  normal.     Left Ear: External ear normal.  Eyes:     General: No scleral icterus.       Right eye: Discharge present.        Left eye: Discharge present.    Extraocular Movements: Extraocular movements intact.     Pupils: Pupils are equal, round, and reactive to light.     Comments: Both scleras are mildly injected and has cream color discharge  Pulmonary:     Effort: Pulmonary effort is normal.  Musculoskeletal:     Cervical back: Neck supple.  Lymphadenopathy:     Cervical: No cervical adenopathy.  Skin:    General: Skin is warm and dry.     Comments: L lateral hip where he had the bite is swollen, erythematous, indurated and tender to palpation.   Neurological:     Mental Status: He is alert and oriented to person, place, and time.     Gait: Gait normal.     Comments: Grossly intact  Psychiatric:        Mood and Affect: Mood normal.        Behavior: Behavior normal.        Thought Content: Thought content normal.        Judgment: Judgment normal.      UC Treatments / Results  Labs (all labs ordered are listed, but only abnormal results are displayed) Labs Reviewed - No data to display  EKG   Radiology No results found.  Procedures Procedures (including critical care time)  Medications Ordered in UC Medications - No data to display  Initial Impression / Assessment and Plan / UC Course  I have reviewed the triage vital signs and the nursing notes.  Bilateral bacterial conjunctivitis Tick bite L hip Rash on tick bite site Abscess L hip  I placed him on Doxy and Polytrip eye gtts as noted.  Final Clinical Impressions(s) / UC Diagnoses   Final diagnoses:  Abscess of skin of left hip  Tick bite of left hip, initial encounter  Bacterial conjunctivitis of both eyes   Discharge Instructions   None    ED Prescriptions     Medication Sig Dispense Auth. Provider    trimethoprim-polymyxin b (POLYTRIM) ophthalmic solution Place 2 drops into both eyes 3 (three) times daily. For 7 days 10 mL Rodriguez-Southworth, Sunday Spillers, PA-C   doxycycline (VIBRAMYCIN) 100 MG capsule Take 1 capsule (100 mg total) by mouth 2 (two) times daily. 42 capsule Rodriguez-Southworth, [place]us, PA-C   nystatin (MYCOSTATIN) 100000 UNIT/ML suspension Take 5 mLs (500,000 Units total) by mouth in the morning, at noon, and at bedtime. For 7 days, then prn recurrence 473 mL Rodriguez-Southworth, Sunday Spillers, PA-C      PDMP not reviewed this encounter.   [place] Mattocks, Hershal Coria 06/10/22 1339

## 2022-08-31 ENCOUNTER — Emergency Department: Payer: Medicare HMO

## 2022-08-31 ENCOUNTER — Emergency Department
Admission: EM | Admit: 2022-08-31 | Discharge: 2022-08-31 | Disposition: A | Discharge status: Home / Self Care | Payer: Medicare HMO | Attending: Emergency Medicine | Admitting: Emergency Medicine

## 2022-08-31 ENCOUNTER — Other Ambulatory Visit: Payer: Self-pay

## 2022-08-31 DIAGNOSIS — G894 Chronic pain syndrome: Secondary | ICD-10-CM | POA: Insufficient documentation

## 2022-08-31 DIAGNOSIS — M25562 Pain in left knee: Secondary | ICD-10-CM | POA: Insufficient documentation

## 2022-08-31 DIAGNOSIS — S46912A Strain of unspecified muscle, fascia and tendon at shoulder and upper arm level, left arm, initial encounter: Secondary | ICD-10-CM | POA: Insufficient documentation

## 2022-08-31 DIAGNOSIS — R0781 Pleurodynia: Secondary | ICD-10-CM | POA: Diagnosis not present

## 2022-08-31 DIAGNOSIS — Y92002 Bathroom of unspecified non-institutional (private) residence single-family (private) house as the place of occurrence of the external cause: Secondary | ICD-10-CM | POA: Insufficient documentation

## 2022-08-31 DIAGNOSIS — I10 Essential (primary) hypertension: Secondary | ICD-10-CM | POA: Diagnosis not present

## 2022-08-31 DIAGNOSIS — M25561 Pain in right knee: Secondary | ICD-10-CM | POA: Insufficient documentation

## 2022-08-31 DIAGNOSIS — S4992XA Unspecified injury of left shoulder and upper arm, initial encounter: Secondary | ICD-10-CM | POA: Diagnosis present

## 2022-08-31 DIAGNOSIS — W182XXA Fall in (into) shower or empty bathtub, initial encounter: Secondary | ICD-10-CM | POA: Diagnosis not present

## 2022-08-31 MED ORDER — CYCLOBENZAPRINE HCL 5 MG PO TABS: 5.0000 mg | ORAL_TABLET | Freq: Three times a day (TID) | ORAL | 0 refills | Status: AC | PRN

## 2022-08-31 MED ORDER — OXYCODONE-ACETAMINOPHEN 5-325 MG PO TABS
1.0000 | ORAL_TABLET | ORAL | Status: DC | PRN
  Administered 2022-08-31: 1 via ORAL
  Filled 2022-08-31: qty 1

## 2022-08-31 MED ORDER — LIDOCAINE 5 % EX PTCH: 1.0000 | MEDICATED_PATCH | Freq: Two times a day (BID) | CUTANEOUS | 0 refills | Status: AC | PRN

## 2022-08-31 MED ORDER — LIDOCAINE 5 % EX PTCH
1.0000 | MEDICATED_PATCH | Freq: Once | CUTANEOUS | Status: DC
  Filled 2022-08-31: qty 1

## 2022-08-31 NOTE — ED Notes (Signed)
Pt. To rad. 

## 2022-08-31 NOTE — ED Triage Notes (Signed)
Patient reports he was in the bathroom and lost his balance due to his bad knees and fell through bathroom door landing on left shoulder.  Reports remembers the event and no LOC.  Swelling and tenderness noted to left should +radial about to wiggle fingers but reports it is tingling and numb from his elbow to finger tips.

## 2022-08-31 NOTE — Discharge Instructions (Addendum)
Your exam and x-ray overall reassuring at this time.  No sign of a serious shoulder injury.  Symptoms may represent a shoulder strain or contusion.  You should take your home medications along with the prescription muscle relaxants as prescribed.  Follow-up with primary provider or orthopedics for ongoing evaluation.  Return to ED if needed.

## 2022-08-31 NOTE — ED Provider Triage Note (Signed)
Emergency Medicine Provider Triage Evaluation Note  Caleb [name] , a 46 y.o. male  was evaluated in triage.  Pt complains of left shoulder pain.  Review of Systems  Positive: Injury left shoulder Negative: cp  Physical Exam  BP (!) 177/110 (BP Location: Right Arm)   Pulse 86   Temp 98.3 F (36.8 C) (Oral)   Resp (!) 22   Ht 6' (1.829 m)   Wt 106.1 kg   SpO2 98%   BMI 31.74 kg/m  Gen:   Awake, no distress   Resp:  Normal effort  MSK:   Left shoulder tender, decreased rom Other:    Medical Decision Making  Medically screening exam initiated at 1:40 PM.  Appropriate orders placed.  [name] was informed that the remainder of the evaluation will be completed by another provider, this initial triage assessment does not replace that evaluation, and the importance of remaining in the ED until their evaluation is complete.     Versie Starks, PA-C 08/31/22 1341

## 2022-08-31 NOTE — ED Provider Notes (Signed)
[facility] Emergency Department Provider Note     Event Date/Time   First MD Initiated Contact with Patient 08/31/22 1505     (approximate)   History   Shoulder Injury   HPI  Caleb [name] is a 46 y.o. male with a history of hypertension, GERD, PTSD, bilateral chronic knee pain, chronic pain syndrome, diverticulitis, presents to the ED for evaluation of injury sustained following mechanical fall.  Patient was apparently on his back and family reports losing his balance secondary to his bad knees.  He reports he fell through the bathroom door opening, landing on his left shoulder.  He denies any head injury or LOC.  He presents to the ED for evaluation of left shoulder pain.  He takes 15 mg of oxycodone daily for his chronic pain syndrome and chronic pain in his knees.  Physical Exam   Triage Vital Signs: ED Triage Vitals  Enc Vitals Group     BP 08/31/22 1334 (!) 177/110     Pulse Rate 08/31/22 1334 86     Resp 08/31/22 1334 (!) 22     Temp 08/31/22 1334 98.3 F (36.8 C)     Temp Source 08/31/22 1334 Oral     SpO2 08/31/22 1334 98 %     Weight 08/31/22 1335 234 lb (106.1 kg)     Height 08/31/22 1335 6' (1.829 m)     Head Circumference --      Peak Flow --      Pain Score 08/31/22 1334 10     Pain Loc --      Pain Edu? --      Excl. in [place]? --     Most recent vital signs: Vitals:   08/31/22 1334  BP: (!) 177/110  Pulse: 86  Resp: (!) 22  Temp: 98.3 F (36.8 C)  SpO2: 98%    General Awake, no distress. NAD HEENT NCAT. PERRL. EOMI. No rhinorrhea. Mucous membranes are moist.  CV:  Good peripheral perfusion.  RESP:  Normal effort.  ABD:  No distention.  MSK:  Patient without obvious deformity, dislocation, or sulcus sign to the left shoulder.  Normal composite fist distally.    ED Results / Procedures / Treatments   Labs (all labs ordered are listed, but only abnormal results are displayed) Labs Reviewed - No data to  display   EKG   RADIOLOGY  I personally viewed and evaluated these images as part of my medical decision making, as well as reviewing the written report by the radiologist.  ED Provider Interpretation: no acute findings  DG Shoulder Left  Result Date: 08/31/2022 CLINICAL DATA:  Deformity. Lost balance and bathroom and fell through bathroom door landing on left shoulder. EXAM: LEFT SHOULDER - 2+ VIEW COMPARISON:  None available FINDINGS: The glenohumeral joint space is maintained. Mild subchondral degenerative cystic changes within the clavicular head with minimal inferior acromioclavicular joint space narrowing. No acute fracture or dislocation. The visualized portion of the left lung is unremarkable. IMPRESSION: Very mild left acromioclavicular osteoarthritis. No acute fracture. Electronically Signed   By: Yvonne Kendall M.D.   On: 08/31/2022 15:41     PROCEDURES:  Critical Care performed: No  Procedures   MEDICATIONS ORDERED IN ED: Medications  oxyCODONE-acetaminophen (PERCOCET/ROXICET) 5-325 MG per tablet 1 tablet (1 tablet Oral Given 08/31/22 1343)  lidocaine (LIDODERM) 5 % 1 patch (has no administration in time range)     IMPRESSION / MDM / ASSESSMENT AND PLAN /  ED COURSE  I reviewed the triage vital signs and the nursing notes.                              Differential diagnosis includes, but is not limited to, shoulder strain, shoulder contusion, shoulder dislocation, shoulder fracture  Patient's presentation is most consistent with acute complicated illness / injury requiring diagnostic workup.  Patient to the ED for evaluation of injury sustained following mechanical fall prior to arrival.  Patient presents with subacute right rib pain after he fell in the bathroom.  He denied any head injury or LOC.  Patient presents in no acute distress.  He is evaluated for complaints in ED, found have overall reassuring exam.  Based on my review of images, there is no evidence of  any shoulder dislocation or fracture.  My interpretation concurs with the radiology report.  Patient's diagnosis is consistent with mechanical fall resulting in shoulder strain. Patient will be discharged home with prescriptions for Flexeril. Patient is to follow up with Ortho or his PCP, as needed or otherwise directed. Patient is given ED precautions to return to the ED for any worsening or new symptoms.     FINAL CLINICAL IMPRESSION(S) / ED DIAGNOSES   Final diagnoses:  Shoulder strain, left, initial encounter     Rx / DC Orders   ED Discharge Orders          Ordered    cyclobenzaprine (FLEXERIL) 5 MG tablet  3 times daily PRN        08/31/22 1554    lidocaine (LIDODERM) 5 %  Every 12 hours PRN        08/31/22 1554             Note:  This document was prepared using Dragon voice recognition software and may include unintentional dictation errors.    [name], PA-C 08/31/22 1956    Lucillie Garfinkel, MD 08/31/22 [phone]

## 2022-10-22 ENCOUNTER — Emergency Department: Payer: Medicare HMO

## 2022-10-22 ENCOUNTER — Other Ambulatory Visit: Payer: Self-pay

## 2022-10-22 DIAGNOSIS — Z7985 Long-term (current) use of injectable non-insulin antidiabetic drugs: Secondary | ICD-10-CM | POA: Diagnosis not present

## 2022-10-22 DIAGNOSIS — I10 Essential (primary) hypertension: Secondary | ICD-10-CM | POA: Diagnosis not present

## 2022-10-22 DIAGNOSIS — Z79899 Other long term (current) drug therapy: Secondary | ICD-10-CM | POA: Insufficient documentation

## 2022-10-22 DIAGNOSIS — Z95820 Peripheral vascular angioplasty status with implants and grafts: Secondary | ICD-10-CM | POA: Diagnosis not present

## 2022-10-22 DIAGNOSIS — R0602 Shortness of breath: Secondary | ICD-10-CM | POA: Insufficient documentation

## 2022-10-22 DIAGNOSIS — R079 Chest pain, unspecified: Secondary | ICD-10-CM | POA: Insufficient documentation

## 2022-10-22 DIAGNOSIS — Z86711 Personal history of pulmonary embolism: Secondary | ICD-10-CM | POA: Insufficient documentation

## 2022-10-22 DIAGNOSIS — Z7984 Long term (current) use of oral hypoglycemic drugs: Secondary | ICD-10-CM | POA: Diagnosis not present

## 2022-10-22 DIAGNOSIS — E119 Type 2 diabetes mellitus without complications: Secondary | ICD-10-CM | POA: Diagnosis not present

## 2022-10-22 DIAGNOSIS — Z7902 Long term (current) use of antithrombotics/antiplatelets: Secondary | ICD-10-CM | POA: Diagnosis not present

## 2022-10-22 DIAGNOSIS — Z8673 Personal history of transient ischemic attack (TIA), and cerebral infarction without residual deficits: Secondary | ICD-10-CM | POA: Diagnosis not present

## 2022-10-22 DIAGNOSIS — R110 Nausea: Secondary | ICD-10-CM | POA: Diagnosis not present

## 2022-10-22 DIAGNOSIS — Z7982 Long term (current) use of aspirin: Secondary | ICD-10-CM | POA: Diagnosis not present

## 2022-10-22 LAB — CBC WITH DIFFERENTIAL/PLATELET
Abs Immature Granulocytes: 0.03 10*3/uL (ref 0.00–0.07)
Basophils Absolute: 0 10*3/uL (ref 0.0–0.1)
Basophils Relative: 0 %
Eosinophils Absolute: 0.6 10*3/uL — ABNORMAL HIGH (ref 0.0–0.5)
Eosinophils Relative: 6 %
HCT: 47.1 % (ref 39.0–52.0)
Hemoglobin: 16.9 g/dL (ref 13.0–17.0)
Immature Granulocytes: 0 %
Lymphocytes Relative: 34 %
Lymphs Abs: 3.3 10*3/uL (ref 0.7–4.0)
MCH: 31.9 pg (ref 26.0–34.0)
MCHC: 35.9 g/dL (ref 30.0–36.0)
MCV: 88.9 fL (ref 80.0–100.0)
Monocytes Absolute: 0.6 10*3/uL (ref 0.1–1.0)
Monocytes Relative: 6 %
Neutro Abs: 5.3 10*3/uL (ref 1.7–7.7)
Neutrophils Relative %: 54 %
Platelets: 283 10*3/uL (ref 150–400)
RBC: 5.3 MIL/uL (ref 4.22–5.81)
RDW: 11.9 % (ref 11.5–15.5)
WBC: 9.9 10*3/uL (ref 4.0–10.5)
nRBC: 0 % (ref 0.0–0.2)

## 2022-10-22 LAB — COMPREHENSIVE METABOLIC PANEL
ALT: 25 U/L (ref 0–44)
AST: 26 U/L (ref 15–41)
Albumin: 4 g/dL (ref 3.5–5.0)
Alkaline Phosphatase: 83 U/L (ref 38–126)
Anion gap: 9 (ref 5–15)
BUN: 11 mg/dL (ref 6–20)
CO2: 23 mmol/L (ref 22–32)
Calcium: 9.3 mg/dL (ref 8.9–10.3)
Chloride: 106 mmol/L (ref 98–111)
Creatinine, Ser: 0.96 mg/dL (ref 0.61–1.24)
GFR, Estimated: >60 mL/min (ref >60)
Glucose, Bld: 307 mg/dL — ABNORMAL HIGH (ref 70–99)
Potassium: 3.8 mmol/L (ref 3.5–5.1)
Sodium: 138 mmol/L (ref 135–145)
Total Bilirubin: 0.6 mg/dL (ref 0.3–1.2)
Total Protein: 7.2 g/dL (ref 6.5–8.1)

## 2022-10-22 LAB — TROPONIN I (HIGH SENSITIVITY): Troponin I (High Sensitivity): 5 ng/L (ref <18)

## 2022-10-22 NOTE — ED Notes (Addendum)
First Nurse Note: Patient arrived via [facility] for chest pain that began three hours ago. Reports he took dose of Viagra 4 hours ago so no nitroglycerin given by EMS. Reports history of MI and CVA. 18 G left AC. CBG 306. 324 aspirin given.

## 2022-10-22 NOTE — ED Provider Triage Note (Signed)
Emergency Medicine Provider Triage Evaluation Note  Caleb [name], a 46 y.o. male  was evaluated in triage.  Pt complains of chest pain.  Patient reports onset about 2 hours prior after taking a single dose of Viagra.  He presents to the ED via EMS from home.  Review of Systems  Positive: CP Negative: NVD  Physical Exam  BP (!) 161/94   Pulse (!) 106   Temp 98.3 F (36.8 C)   Resp (!) 24   Ht 6' (1.829 m)   Wt 106 kg   SpO2 95%   BMI 31.69 kg/m  Gen:   Awake, no distress  NAD Resp:  Normal effort CTA MSK:   Moves extremities without difficulty  Other:    Medical Decision Making  Medically screening exam initiated at 8:02 PM.  Appropriate orders placed.  Caleb [name] was informed that the remainder of the evaluation will be completed by another provider, this initial triage assessment does not replace that evaluation, and the importance of remaining in the ED until their evaluation is complete.  Patient to the ED for evaluation of chest pain approximately 2 hours ago after he dosed Viagra.   Caleb Needles, PA-C 10/22/22 2004

## 2022-10-22 NOTE — ED Triage Notes (Signed)
Pt took viagra approx 4.5 hrs ago and then started having chest pain approx 2 hrs ago. He did not take nitro as he was advised not to when he takes the viagra. Pt was given ASA by EMS. Pt sts the pain is a 7/10.

## 2022-10-23 ENCOUNTER — Emergency Department
Admission: EM | Admit: 2022-10-23 | Discharge: 2022-10-23 | Disposition: A | Discharge status: Home / Self Care | Payer: Medicare HMO | Attending: Emergency Medicine | Admitting: Emergency Medicine

## 2022-10-23 DIAGNOSIS — R079 Chest pain, unspecified: Secondary | ICD-10-CM

## 2022-10-23 LAB — TROPONIN I (HIGH SENSITIVITY): Troponin I (High Sensitivity): 6 ng/L (ref <18)

## 2022-10-23 LAB — D-DIMER, QUANTITATIVE: D-Dimer, Quant: <0.27 ug/mL-FEU (ref 0.00–0.50)

## 2022-10-23 MED ORDER — KETOROLAC TROMETHAMINE 30 MG/ML IJ SOLN
30.0000 mg | Freq: Once | INTRAMUSCULAR | Status: AC
  Administered 2022-10-23: 30 mg via INTRAVENOUS
  Filled 2022-10-23: qty 1

## 2022-10-23 MED ORDER — ONDANSETRON HCL 4 MG/2ML IJ SOLN
4.0000 mg | Freq: Once | INTRAMUSCULAR | Status: AC
Start: 2022-10-23 — End: 2022-10-23
  Administered 2022-10-23: 4 mg via INTRAVENOUS
  Filled 2022-10-23: qty 2

## 2022-10-23 NOTE — ED Provider Notes (Signed)
[facility] Provider Note    Event Date/Time   First MD Initiated Contact with Patient 10/23/22 0109     (approximate)   History   Chest Pain   HPI  Caleb [name] is a 46 y.o. male with history of hypertension, diabetes, hyperlipidemia, CVA, PE, peripheral arterial disease on Plavix, chronic pain who presents to the emergency department with complaints of chest pain, shortness of breath and nausea that started after taking Viagra.  Symptoms came on at rest and have not resolved.  No fevers or cough.  No lower extremity swelling or pain.   History provided by patient.    Past Medical History:  Diagnosis Date   Anxiety    Arthritis    Chronic pain    Diabetes mellitus without complication (HCC)    GERD (gastroesophageal reflux disease)    History of degenerative disc disease    Hypercholesteremia    Hypertension    Knee pain, bilateral    Myocardial infarct [facility])    PTSD (post-traumatic stress disorder)    Pulmonary embolism ([place])    Sleep apnea    Stroke ([place]) 2015   mold stroke per patient    Past Surgical History:  Procedure Laterality Date   ANGIOPLASTY / STENTING FEMORAL     2 stents   COLONOSCOPY WITH PROPOFOL N/A 05/07/2017   Procedure: COLONOSCOPY WITH PROPOFOL;  Surgeon: Lollie Sails, MD;  Location: [facility] ENDOSCOPY;  Service: Endoscopy;  Laterality: N/A;   COLONOSCOPY WITH PROPOFOL N/A 04/11/2021   Procedure: COLONOSCOPY WITH PROPOFOL;  Surgeon: Virgel Manifold, MD;  Location: [facility] ENDOSCOPY;  Service: Endoscopy;  Laterality: N/A;   COLONOSCOPY WITH PROPOFOL N/A 03/21/2022   Procedure: COLONOSCOPY WITH PROPOFOL;  Surgeon: Lin Landsman, MD;  Location[facility] ENDOSCOPY;  Service: Gastroenterology;  Laterality: N/A;   ESOPHAGOGASTRODUODENOSCOPY (EGD) WITH PROPOFOL N/A 05/07/2017   Procedure: ESOPHAGOGASTRODUODENOSCOPY (EGD) WITH PROPOFOL;  Surgeon: Lollie Sails, MD;  Location: [facility] ENDOSCOPY;  Service:  Endoscopy;  Laterality: N/A;   HERNIA REPAIR     Knee surgeries     TONSILLECTOMY      MEDICATIONS:  Prior to Admission medications   Medication Sig Start Date End Date Taking? Authorizing Provider  Accu-Chek Softclix Lancets lancets 1 each 3 (three) times daily. 01/17/22   [provider]  amLODipine (NORVASC) 5 MG tablet Take 5 mg by mouth daily. 12/17/20 02/10/22  [provider]  aspirin EC 81 MG tablet Take 81 mg by mouth daily.    [provider]  atorvastatin (LIPITOR) 80 MG tablet Take 80 mg by mouth daily. 06/05/21 06/10/22  [provider]  Blood Glucose Monitoring Suppl (ACCU-CHEK GUIDE ME) w/Device KIT See admin instructions. 01/17/22   [provider]  Blood Glucose Monitoring Suppl (FIFTY50 GLUCOSE METER 2.0) w/Device KIT Check once daily 10/28/21 10/28/22  [provider]  cetirizine (ZYRTEC) 10 MG tablet Take 10 mg by mouth daily.    [provider]  clopidogrel (PLAVIX) 75 MG tablet Take 75 mg by mouth daily. 06/14/20   [provider]  cyclobenzaprine (FLEXERIL) 5 MG tablet Take 1 tablet (5 mg total) by mouth 3 (three) times daily as needed. 08/31/22   Menshew, Dannielle Karvonen, PA-C  diazepam (VALIUM) 5 MG tablet Take 5 mg by mouth every 6 (six) hours as needed. 03/20/22   [provider]  diphenhydrAMINE (BENADRYL) 25 mg capsule Take 50 mg by mouth every 6 (six) hours as needed for itching or allergies.  [provider]  doxycycline (VIBRAMYCIN) 100 MG capsule Take 1 capsule (100 mg total) by mouth 2 (two) times daily. 06/10/22   Rodriguez-Southworth, Sunday Spillers, PA-C  EPINEPHrine 0.3 mg/0.3 mL IJ SOAJ injection Inject into the muscle. 09/24/19   [provider]  esomeprazole (NEXIUM) 20 MG capsule Take 20 mg by mouth daily as needed.    [provider]  ezetimibe (ZETIA) 10 MG tablet Take 10 mg by mouth daily. 01/18/22 01/18/23  [provider]  glimepiride (AMARYL) 2 MG  tablet Take 2-4 mg by mouth 2 (two) times daily. 01/17/22   [provider]  glucose blood (FREESTYLE INSULINX TEST) test strip daily. 04/25/21   [provider]  isosorbide mononitrate (IMDUR) 30 MG 24 hr tablet Take 1 tablet (30 mg total) by mouth daily. 02/11/22 06/10/22  Wyvonnia Dusky, MD  Lancets (FREESTYLE) lancets daily. 04/25/21   [provider]  losartan-hydrochlorothiazide (HYZAAR) 100-25 MG tablet Take 1 tablet by mouth daily. 03/20/22 03/20/23  [provider]  metoprolol succinate (TOPROL-XL) 50 MG 24 hr tablet Take 50 mg by mouth daily. 12/24/17   [provider]  nortriptyline (PAMELOR) 25 MG capsule Take 25 mg by mouth at bedtime. 10/25/21   [provider]  nystatin (MYCOSTATIN) 100000 UNIT/ML suspension Take 5 mLs (500,000 Units total) by mouth in the morning, at noon, and at bedtime. For 7 days, then prn recurrence 06/10/22   Rodriguez-Southworth, Sunday Spillers, PA-C  ranolazine (RANEXA) 500 MG 12 hr tablet Take 500 mg by mouth 2 (two) times daily. 01/17/22 01/17/23  [provider]  Semaglutide,0.25 or 0.5MG/DOS, 2 MG/3ML SOPN Inject 0.25 mg into the skin once a week. 05/10/22   Jennye Boroughs, MD  trimethoprim-polymyxin b (POLYTRIM) ophthalmic solution Place 2 drops into both eyes 3 (three) times daily. For 7 days 06/10/22   Shelby Mattocks, PA-C    Physical Exam   Triage Vital Signs: ED Triage Vitals  Enc Vitals Group     BP 10/22/22 2000 (!) 161/94     Pulse Rate 10/22/22 2000 (!) 106     Resp 10/22/22 2000 (!) 24     Temp 10/22/22 2000 98.3 F (36.8 C)     Temp Source 10/22/22 2325 Oral     SpO2 10/22/22 2000 95 %     Weight 10/22/22 2001 233 lb 11 oz (106 kg)     Height 10/22/22 2001 6' (1.829 m)     Head Circumference --      Peak Flow --      Pain Score 10/22/22 2001 7     Pain Loc --      Pain Edu? --      Excl. in [place]? --     Most recent vital signs: Vitals:   10/23/22 0100 10/23/22 0130  BP:  (!) 156/95 (!) 150/96  Pulse: 93 78  Resp: 12 18  Temp:  98 F (36.7 C)  SpO2: 97% 96%    CONSTITUTIONAL: Alert and oriented and responds appropriately to questions. Well-appearing; well-nourished HEAD: Normocephalic, atraumatic EYES: Conjunctivae clear, pupils appear equal, sclera nonicteric ENT: normal nose; moist mucous membranes NECK: Supple, normal ROM CARD: Regular tachycardic; S1 and S2 appreciated; no murmurs, no clicks, no rubs, no gallops RESP: Normal chest excursion without splinting or tachypnea; breath sounds clear and equal bilaterally; no wheezes, no rhonchi, no rales, no hypoxia or respiratory distress, speaking full sentences ABD/GI: Normal bowel sounds; non-distended; soft, non-tender, no rebound, no guarding, no peritoneal signs BACK: The back  appears normal EXT: Normal ROM in all joints; no deformity noted, no edema; no cyanosis, no calf tenderness or calf swelling SKIN: Normal color for age and race; warm; no rash on exposed skin NEURO: Moves all extremities equally, normal speech PSYCH: The patient's mood and manner are appropriate.   ED Results / Procedures / Treatments   LABS: (all labs ordered are listed, but only abnormal results are displayed) Labs Reviewed  CBC WITH DIFFERENTIAL/PLATELET - Abnormal; Notable for the following components:      Result Value   Eosinophils Absolute 0.6 (*)    All other components within normal limits  COMPREHENSIVE METABOLIC PANEL - Abnormal; Notable for the following components:   Glucose, Bld 307 (*)    All other components within normal limits  D-DIMER, QUANTITATIVE  TROPONIN I (HIGH SENSITIVITY)  TROPONIN I (HIGH SENSITIVITY)     EKG:  EKG Interpretation  Date/Time:  Sunday October 22 2022 19:59:08 EST Ventricular Rate:  101 PR Interval:  142 QRS Duration: 88 QT Interval:  326 QTC Calculation: 422 R Axis:   45 Text Interpretation: Sinus tachycardia T wave abnormality, consider inferior ischemia Abnormal  ECG When compared with ECG of 08-May-2022 18:15, No significant change was found Confirmed by Pryor Curia [phone]) on 10/23/2022 1:12:17 AM         RADIOLOGY: My personal review and interpretation of imaging: Chest x-ray clear.  I have personally reviewed all radiology reports.   DG Chest 2 View  Result Date: 10/22/2022 CLINICAL DATA:  Chest pain. EXAM: CHEST - 2 VIEW COMPARISON:  Chest radiograph dated 05/08/2022. FINDINGS: Normal bibasilar atelectasis. No focal consolidation, pleural effusion, or pneumothorax. The cardiac silhouette is within normal limits. No acute osseous pathology. IMPRESSION: No active cardiopulmonary disease. Electronically Signed   By: Anner Crete M.D.   On: 10/22/2022 20:27     PROCEDURES:  Critical Care performed: No     .1-3 Lead EKG Interpretation  Performed by: Trivia Heffelfinger, Delice Bison, DO Authorized by: Toniqua Melamed, Delice Bison, DO     Interpretation: normal     ECG rate:  91   ECG rate assessment: normal     Rhythm: sinus rhythm     Ectopy: none     Conduction: normal       IMPRESSION / MDM / ASSESSMENT AND PLAN / ED COURSE  I reviewed the triage vital signs and the nursing notes.    Patient here with chest pain, shortness of breath and nausea after take Agro.  The patient is on the cardiac monitor to evaluate for evidence of arrhythmia and/or significant heart rate changes.   DIFFERENTIAL DIAGNOSIS (includes but not limited to):   ACS, PE, pneumonia, pneumothorax, CHF, GERD, esophagitis, esophageal spasm, less likely dissection   Patient's presentation is most consistent with acute presentation with potential threat to life or bodily function.   PLAN: Workup initiated from triage.  No leukocytosis, normal hemoglobin, normal electrolytes other than glucose of 307 but no DKA.  Troponin x 2 negative.  Chest x-ray reviewed and interpreted by myself and the radiologist and shows no acute abnormality.  Given his history of PE and intermittent  tachycardia and tachypnea, will obtain D-dimer.  Will give Toradol, Zofran for symptomatic relief given he is not able to tell me what time he took Viagra.  EKG shows no new ischemic change compared to previous.   MEDICATIONS GIVEN IN ED: Medications  ketorolac (TORADOL) 30 MG/ML injection 30 mg (30 mg Intravenous Given 10/23/22 0201)  ondansetron (ZOFRAN) injection  4 mg (4 mg Intravenous Given 10/23/22 0201)     ED COURSE: D-dimer negative.  Recommended close outpatient follow-up with his PCP and cardiologist.   At this time, I do not feel there is any life-threatening condition present. I reviewed all nursing notes, vitals, pertinent previous records.  All lab and urine results, EKGs, imaging ordered have been independently reviewed and interpreted by myself.  I reviewed all available radiology reports from any imaging ordered this visit.  Based on my assessment, I feel the patient is safe to be discharged home without further emergent workup and can continue workup as an outpatient as needed. Discussed all findings, treatment plan as well as usual and customary return precautions.  They verbalize understanding and are comfortable with this plan.  Outpatient follow-up has been provided as needed.  All questions have been answered.   CONSULTS: Admission considered but patient is feeling better with reassuring work-up.   OUTSIDE RECORDS REVIEWED: Family medicine visit on 03/20/2022.  He has a narcotic contract with [facility] for chronic back pain.   Echo 06/2018:  Study Conclusions   - Left ventricle: The cavity size was normal. Wall thickness was    normal. Systolic function was normal. The estimated ejection    fraction was in the range of 50% to 55%.    Coronary CT 06/2018:  FINDINGS: FFRct analysis was performed on the original cardiac CT angiogram dataset. Diagrammatic representation of the FFRct analysis is provided in a separate PDF document in PACS. This  dictation was created using the PDF document and an interactive 3D model of the results. 3D model is not available in the EMR/PACS. Normal FFR range is >0.80.   1. Left Main:  No significant stenosis.   2. LAD: No significant stenosis. 3. LCX: No significant stenosis. 4. RCA: No significant stenosis.   IMPRESSION: 1.  CT FFR analysis didn't show any significant stenosis.   FINAL CLINICAL IMPRESSION(S) / ED DIAGNOSES   Final diagnoses:  Nonspecific chest pain     Rx / DC Orders   ED Discharge Orders     None        Note:  This document was prepared using Dragon voice recognition software and may include unintentional dictation errors.   Kaya Pottenger, Delice Bison, DO 10/23/22 [phone]

## 2022-11-21 ENCOUNTER — Emergency Department: Payer: Medicare HMO

## 2022-11-21 ENCOUNTER — Emergency Department
Admission: EM | Admit: 2022-11-21 | Discharge: 2022-11-21 | Disposition: A | Discharge status: Home / Self Care | Payer: Medicare HMO | Attending: Emergency Medicine | Admitting: Emergency Medicine

## 2022-11-21 ENCOUNTER — Other Ambulatory Visit: Payer: Self-pay

## 2022-11-21 DIAGNOSIS — R079 Chest pain, unspecified: Secondary | ICD-10-CM | POA: Insufficient documentation

## 2022-11-21 DIAGNOSIS — I10 Essential (primary) hypertension: Secondary | ICD-10-CM | POA: Insufficient documentation

## 2022-11-21 DIAGNOSIS — I2510 Atherosclerotic heart disease of native coronary artery without angina pectoris: Secondary | ICD-10-CM | POA: Insufficient documentation

## 2022-11-21 LAB — CBC
HCT: 43.7 % (ref 39.0–52.0)
Hemoglobin: 15.7 g/dL (ref 13.0–17.0)
MCH: 31.6 pg (ref 26.0–34.0)
MCHC: 35.9 g/dL (ref 30.0–36.0)
MCV: 87.9 fL (ref 80.0–100.0)
Platelets: 279 10*3/uL (ref 150–400)
RBC: 4.97 MIL/uL (ref 4.22–5.81)
RDW: 12.2 % (ref 11.5–15.5)
WBC: 8.2 10*3/uL (ref 4.0–10.5)
nRBC: 0 % (ref 0.0–0.2)

## 2022-11-21 LAB — BASIC METABOLIC PANEL
Anion gap: 11 (ref 5–15)
BUN: 12 mg/dL (ref 6–20)
CO2: 26 mmol/L (ref 22–32)
Calcium: 9.1 mg/dL (ref 8.9–10.3)
Chloride: 100 mmol/L (ref 98–111)
Creatinine, Ser: 0.83 mg/dL (ref 0.61–1.24)
GFR, Estimated: >60 mL/min (ref >60)
Glucose, Bld: 270 mg/dL — ABNORMAL HIGH (ref 70–99)
Potassium: 3.4 mmol/L — ABNORMAL LOW (ref 3.5–5.1)
Sodium: 137 mmol/L (ref 135–145)

## 2022-11-21 LAB — D-DIMER, QUANTITATIVE: D-Dimer, Quant: <0.27 ug/mL-FEU (ref 0.00–0.50)

## 2022-11-21 LAB — TROPONIN I (HIGH SENSITIVITY)
Troponin I (High Sensitivity): 5 ng/L (ref <18)
Troponin I (High Sensitivity): 6 ng/L (ref <18)

## 2022-11-21 MED ORDER — ONDANSETRON HCL 4 MG/2ML IJ SOLN
4.0000 mg | Freq: Once | INTRAMUSCULAR | Status: AC
  Administered 2022-11-21: 4 mg via INTRAVENOUS
  Filled 2022-11-21: qty 2

## 2022-11-21 MED ORDER — MORPHINE SULFATE (PF) 4 MG/ML IV SOLN
4.0000 mg | Freq: Once | INTRAVENOUS | Status: AC
  Administered 2022-11-21: 4 mg via INTRAVENOUS
  Filled 2022-11-21: qty 1

## 2022-11-21 NOTE — ED Provider Notes (Signed)
[facility] Provider Note  Patient Contact: 7:47 PM (approximate)   History   Chest Pain   HPI  Caleb [name] is a 47 y.o. male with a history of hypertension, chronic pain syndrome currently under pain management contract, coronary artery disease, NSTEMI, status post DES to LAD in 10/10/2019, provoked DVT, PTSD who presents to the emergency department with left-sided chest pain that radiates to the jaw and along the left aspect of his neck.  Patient states that he has had multiple episodes of left-sided chest pain in the past similar to current with no associated shortness of breath.  Patient was seen at [facility] yesterday with a reassuring cardiac workup.  Patient states that he continued to have left-sided chest pain with no relief after being discharged and had an episode of syncope while eating ice cream earlier today.  Patient states the episode of syncope was witnessed and patient was unarousable for approximately 5 minutes.  Patient states that his chest pain is reproducible to palpation.      Physical Exam   Triage Vital Signs: ED Triage Vitals  Enc Vitals Group     BP 11/21/22 1544 (!) 158/97     Pulse Rate 11/21/22 1544 94     Resp 11/21/22 1544 (!) 26     Temp 11/21/22 1544 98.6 F (37 C)     Temp Source 11/21/22 1544 Oral     SpO2 11/21/22 1544 98 %     Weight --      Height --      Head Circumference --      Peak Flow --      Pain Score 11/21/22 1545 7     Pain Loc --      Pain Edu? --      Excl. in [place]? --     Most recent vital signs: Vitals:   11/21/22 1544 11/21/22 2025  BP: (!) 158/97 (!) 159/88  Pulse: 94 86  Resp: (!) 26 18  Temp: 98.6 F (37 C) 98.1 F (36.7 C)  SpO2: 98% 98%     General: Alert and in no acute distress. Eyes:  PERRL. EOMI. Head: No acute traumatic findings ENT:      Nose: No congestion/rhinnorhea.      Mouth/Throat: Mucous membranes are moist. Neck: No stridor. No cervical spine tenderness to  palpation. Cardiovascular:  Good peripheral perfusion Respiratory: Normal respiratory effort without tachypnea or retractions. Lungs CTAB. Good air entry to the bases with no decreased or absent breath sounds. Gastrointestinal: Bowel sounds 4 quadrants. Soft and nontender to palpation. No guarding or rigidity. No palpable masses. No distention. No CVA tenderness. Musculoskeletal: Full range of motion to all extremities.  Neurologic:  No gross focal neurologic deficits are appreciated.  Skin:   No rash noted    ED Results / Procedures / Treatments   Labs (all labs ordered are listed, but only abnormal results are displayed) Labs Reviewed  BASIC METABOLIC PANEL - Abnormal; Notable for the following components:      Result Value   Potassium 3.4 (*)    Glucose, Bld 270 (*)    All other components within normal limits  CBC  D-DIMER, QUANTITATIVE  TROPONIN I (HIGH SENSITIVITY)  TROPONIN I (HIGH SENSITIVITY)     EKG  Normal sinus rhythm without ST segment elevation or other apparent arrhythmia.   RADIOLOGY  I personally viewed and evaluated these images as part of my medical decision making, as well as reviewing  the written report by the radiologist.  ED Provider Interpretation: Chest x-ray unremarkable   PROCEDURES:  Critical Care performed: No  Procedures   MEDICATIONS ORDERED IN ED: Medications  morphine (PF) 4 MG/ML injection 4 mg (4 mg Intravenous Given 11/21/22 2027)  ondansetron (ZOFRAN) injection 4 mg (4 mg Intravenous Given 11/21/22 2026)     IMPRESSION / MDM / ASSESSMENT AND PLAN / ED COURSE  I reviewed the triage vital signs and the nursing notes.                              Assessment and plan: Chest pain:  47 year old male presents to the emergency department with reproducible left anterior chest pain.  Patient was hypertensive at triage but vital signs otherwise reassuring.  On exam, patient was alert and nontoxic-appearing.  I reviewed cardiac  workup from yesterday at [facility] which was reassuring.  Patient's delta troponin in the emergency department tonight was within range.  D-dimer within range.  CBC and BMP reassuring.   EKG indicates normal sinus rhythm without ST segment elevation or other apparent arrhythmia.  Chest x-ray unremarkable.  Patient given morphine in the emergency department and his pain improved.  I do feel that patient is appropriate for outpatient cardiology follow-up and referral was given.  Patient was cautioned to return to the emergency department with new or worsening symptoms.  All patient questions were answered.  FINAL CLINICAL IMPRESSION(S) / ED DIAGNOSES   Final diagnoses:  Chest pain, unspecified type     Rx / DC Orders   ED Discharge Orders     None        Note:  This document was prepared using Dragon voice recognition software and may include unintentional dictation errors.   Vallarie Mare Lillie, Hershal Coria 11/21/22 2147    Harvest Dark, MD 11/21/22 2239

## 2022-11-21 NOTE — Discharge Instructions (Signed)
Please make follow-up appointment with Dr. Saralyn Pilar.

## 2022-11-21 NOTE — ED Triage Notes (Signed)
Pt to ED via [facility] from home. Pt reports started having severe left sided CP that radiates in left shoulder for 2 days. Pt seen at [facility] yesterday with negative cardiac workup. Pt reports hx MI with stents placed. Pt states wife told him he had syncopal episode and pt does not recall. Pt on Plavix. 18g RAC. 1 nitro given in route

## 2022-11-21 NOTE — ED Provider Triage Note (Signed)
Emergency Medicine Provider Triage Evaluation Note  Caleb [name], a 47 y.o. male  was evaluated in triage.  Pt complains of left-sided chest pain with left upper tremor referral.  Patient was evaluated at [facility] for the same complaint yesterday, with a negative cardiac workup.  He presents today noting his wife said he passed out, still endorsing left-sided chest pain.  He is currently on Plavix.  Review of Systems  Positive: CP, syncope Negative: FCS  Physical Exam  BP (!) 158/97   Pulse 94   Temp 98.6 F (37 C) (Oral)   Resp (!) 26   SpO2 98%  Gen:   Awake, no distress  NAD Resp:  Normal effort CTA MSK:   Moves extremities without difficulty  Other:    Medical Decision Making  Medically screening exam initiated at 3:48 PM.  Appropriate orders placed.  Caleb [name] was informed that the remainder of the evaluation will be completed by another provider, this initial triage assessment does not replace that evaluation, and the importance of remaining in the ED until their evaluation is complete.  Patient to the ED with a history of hypertension and stroke, noting left-sided chest pain with onset today.  Patient had complete and negative cardiac workup yesterday at [facility] system.   Caleb Needles, PA-C 11/21/22 1550

## 2022-12-15 ENCOUNTER — Ambulatory Visit
Admission: EM | Admit: 2022-12-15 | Discharge: 2022-12-15 | Disposition: A | Discharge status: Home / Self Care | Payer: Medicare HMO | Attending: Emergency Medicine | Admitting: Emergency Medicine

## 2022-12-15 DIAGNOSIS — L03213 Periorbital cellulitis: Secondary | ICD-10-CM | POA: Diagnosis not present

## 2022-12-15 MED ORDER — AMOXICILLIN-POT CLAVULANATE 875-125 MG PO TABS: 1.0000 | ORAL_TABLET | Freq: Two times a day (BID) | ORAL | 0 refills | Status: AC

## 2022-12-15 NOTE — Discharge Instructions (Signed)
Take the Augmentin twice daily with food for 10 days for treatment of your eye infection.  Apply warm compresses to your eye to help facilitate drainage.  If you develop worsening swelling, changes in vision, or fever you need to go to the ER for evaluation.

## 2022-12-15 NOTE — ED Triage Notes (Signed)
Pt c/o of L eye drainage,pain & irritation x2 days due to personal dog licking his eye. Vision blurry in the AM. Dog up to date w/vaccines.

## 2022-12-15 NOTE — ED Provider Notes (Signed)
[facility]    CSN: [id number] Arrival date & time: 12/15/22  1205      History   Chief Complaint Chief Complaint  Patient presents with   Eye Drainage    HPI Caleb [name] is a 47 y.o. male.   HPI  47 year old male here for evaluation of left eye irritation and drainage.  The patient reports that he was licked by his personal dog and developed swelling to his left upper eyelid along with puslike drainage.  Any changes to vision and he denies any itching.  Past Medical History:  Diagnosis Date   Anxiety    Arthritis    Chronic pain    Diabetes mellitus without complication (HCC)    GERD (gastroesophageal reflux disease)    History of degenerative disc disease    Hypercholesteremia    Hypertension    Knee pain, bilateral    Myocardial infarct [facility])    PTSD (post-traumatic stress disorder)    Pulmonary embolism ([place])    Sleep apnea    Stroke ([place]) 2015   mold stroke per patient    Patient Active Problem List   Diagnosis Date Noted   PTSD (post-traumatic stress disorder) 01/24/2022   History of colonic polyps    Polyp of colon    Diabetic polyneuropathy associated with type 2 diabetes mellitus ([place]) 03/03/2021   Chronic pain syndrome 12/16/2020   History of alcohol abuse 09/30/2019   Coronary artery disease involving native coronary artery of native heart 09/23/2019   S/P drug eluting coronary stent placement 09/23/2019   Steroid-induced hyperglycemia 09/23/2019   Chronic, continuous use of opioids 09/22/2019   Heavy alcohol use 09/22/2019   Hyperglycemia 09/22/2019   Memory difficulties 09/22/2019   Psychophysiological insomnia 03/06/2019   Leukocytosis 07/01/2018   Chest pain 06/30/2018   Hypertensive urgency 06/30/2018   Hypokalemia 06/30/2018   Fall 06/30/2018   Anxiety    Hypercholesteremia    Stroke-like symptom 10/16/2017   Gastritis, erosive 05/21/2017   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   Uncontrolled type 2  diabetes mellitus with hyperglycemia, without long-term current use of insulin ([place]) 03/18/2017   Vitamin D deficiency 03/18/2017   LVH (left ventricular hypertrophy) due to hypertensive disease, without heart failure 01/29/2017   Bee sting allergy 06/13/2016   Chronic midline low back pain without sciatica 06/13/2016   Kidney stone 06/12/2016   History of colonic diverticulitis 03/07/2016   Diverticulitis 02/26/2016   Diverticulitis of large intestine without perforation or abscess without bleeding    Hemiplegic migraine without status migrainosus, 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/21/2015   OSA (obstructive sleep apnea) 06/21/2015   Dental caries, unspecified 06/24/2014   Stroke ([place]) 27/74/1287   Alcoholic liver disease, unspecified ([place]) 05/21/2013   Allergy to radiographic contrast media 05/21/2013   Chronic liver disease 05/21/2013   High risk medications (not anticoagulants) long-term use 02/27/2013   BMI 32.0-32.9,adult 12/12/2012   Morbid obesity ([place]) 12/12/2012   Personal history of combat and operational stress reaction 02/22/2012   Primary localized osteoarthrosis, lower leg 12/04/2011   Health care maintenance 07/26/2010   Nicotine dependence, cigarettes, uncomplicated 86/76/7209   Asthma 03/24/2009   History of pulmonary embolus (PE) 03/24/2009   Essential hypertension 01/12/2009   Gastroesophageal reflux disease without esophagitis 06/22/2004    Past Surgical History:  Procedure Laterality Date   ANGIOPLASTY / STENTING FEMORAL     2 stents   COLONOSCOPY WITH PROPOFOL N/A 05/07/2017   Procedure: COLONOSCOPY WITH PROPOFOL;  Surgeon: Lollie Sails, MD;  Location: [facility] ENDOSCOPY;  Service: Endoscopy;  Laterality: N/A;   COLONOSCOPY WITH PROPOFOL N/A 04/11/2021   Procedure: COLONOSCOPY WITH PROPOFOL;  Surgeon: Virgel Manifold, MD;  Location: [facility] ENDOSCOPY;  Service: Endoscopy;  Laterality: N/A;   COLONOSCOPY WITH PROPOFOL N/A 03/21/2022   Procedure: COLONOSCOPY WITH  PROPOFOL;  Surgeon: Lin Landsman, MD;  Location[facility]COPY;  Service: Gastroenterology;  Laterality: N/A;   ESOPHAGOGASTRODUODENOSCOPY (EGD) WITH PROPOFOL N/A 05/07/2017   Procedure: ESOPHAGOGASTRODUODENOSCOPY (EGD) WITH PROPOFOL;  Surgeon: Lollie Sails, MD;  Location: [facility] ENDOSCOPY;  Service: Endoscopy;  Laterality: N/A;   HERNIA REPAIR     Knee surgeries     TONSILLECTOMY         Home Medications    Prior to Admission medications   Medication Sig Start Date End Date Taking? Authorizing Provider  Accu-Chek Softclix Lancets lancets 1 each 3 (three) times daily. 01/17/22  Yes [provider]  amoxicillin-clavulanate (AUGMENTIN) 875-125 MG tablet Take 1 tablet by mouth every 12 (twelve) hours for 10 days. 12/15/22 12/25/22 Yes Margarette Canada, NP  aspirin EC 81 MG tablet Take 81 mg by mouth daily.   Yes [provider]  Blood Glucose Monitoring Suppl (ACCU-CHEK GUIDE ME) w/Device KIT See admin instructions. 01/17/22  Yes [provider]  cetirizine (ZYRTEC) 10 MG tablet Take 10 mg by mouth daily.   Yes [provider]  clopidogrel (PLAVIX) 75 MG tablet Take 75 mg by mouth daily. 06/14/20  Yes [provider]  cyclobenzaprine (FLEXERIL) 5 MG tablet Take 1 tablet (5 mg total) by mouth 3 (three) times daily as needed. 08/31/22  Yes Menshew, Dannielle Karvonen, PA-C  diazepam (VALIUM) 5 MG tablet Take 5 mg by mouth every 6 (six) hours as needed. 03/20/22  Yes [provider]  diphenhydrAMINE (BENADRYL) 25 mg capsule Take 50 mg by mouth every 6 (six) hours as needed for itching or allergies.   Yes [provider]  EPINEPHrine 0.3 mg/0.3 mL IJ SOAJ injection Inject into the muscle. 09/24/19  Yes [provider]  esomeprazole (NEXIUM) 20 MG capsule Take 20 mg by mouth daily as needed.   Yes [provider]  ezetimibe (ZETIA) 10 MG tablet Take 10 mg by mouth daily. 01/18/22 01/18/23 Yes [provider]   glimepiride (AMARYL) 2 MG tablet Take 2-4 mg by mouth 2 (two) times daily. 01/17/22  Yes [provider]  glucose blood (FREESTYLE INSULINX TEST) test strip daily. 04/25/21  Yes [provider]  Lancets (FREESTYLE) lancets daily. 04/25/21  Yes [provider]  losartan-hydrochlorothiazide (HYZAAR) 100-25 MG tablet Take 1 tablet by mouth daily. 03/20/22 03/20/23 Yes [provider]  metoprolol succinate (TOPROL-XL) 50 MG 24 hr tablet Take 50 mg by mouth daily. 12/24/17  Yes [provider]  nortriptyline (PAMELOR) 25 MG capsule Take 25 mg by mouth at bedtime. 10/25/21  Yes [provider]  nystatin (MYCOSTATIN) 100000 UNIT/ML suspension Take 5 mLs (500,000 Units total) by mouth in the morning, at noon, and at bedtime. For 7 days, then prn recurrence 06/10/22  Yes Rodriguez-Southworth, Sunday Spillers, PA-C  ranolazine (RANEXA) 500 MG 12 hr tablet Take 500 mg by mouth 2 (two) times daily. 01/17/22 01/17/23 Yes [provider]  Semaglutide,0.25 or 0.'5MG'$ /DOS, 2 MG/3ML SOPN Inject 0.25 mg into the skin once a week. 05/10/22  Yes Jennye Boroughs, MD  trimethoprim-polymyxin b (POLYTRIM) ophthalmic solution Place 2 drops into both eyes 3 (three) times daily. For 7 days 06/10/22  Yes Rodriguez-Southworth, [place], [place]  amLODipine (NORVASC) 5 MG tablet Take 5 mg by mouth daily. 12/17/20 02/10/22  [provider]  atorvastatin (LIPITOR) 80 MG tablet Take 80 mg by mouth daily. 06/05/21 06/10/22  [provider]  isosorbide mononitrate (IMDUR) 30 MG 24 hr tablet Take 1 tablet (30 mg total) by mouth daily. 02/11/22 06/10/22  Wyvonnia Dusky, MD    Family History Family History  Problem Relation Age of Onset   Diabetes Mother    Cancer Paternal Grandmother    Cancer Paternal Grandfather    Heart attack Maternal Grandfather 49    Social History Social History   Tobacco Use   Smoking status: Every Day    Packs/day: 1.00    Years: 20.00    Total  pack years: 20.00    Types: Cigarettes    Last attempt to quit: 04/21/2022    Years since quitting: 0.6   Smokeless tobacco: Never  Vaping Use   Vaping Use: Never used  Substance Use Topics   Alcohol use: Yes    Alcohol/week: 4.0 standard drinks of alcohol    Types: 4 Cans of beer per week    Comment: occas.    Drug use: Not Currently     Allergies   Bee venom, Iodinated contrast media, Metformin and related, and Lisinopril   Review of Systems Review of Systems  Eyes:  Positive for pain, discharge and redness. Negative for photophobia, itching and visual disturbance.  Skin:  Positive for color change.  Hematological: Negative.   Psychiatric/Behavioral: Negative.       Physical Exam Triage Vital Signs ED Triage Vitals  Enc Vitals Group     BP 12/15/22 1319 (!) 174/104     Pulse Rate 12/15/22 1319 81     Resp 12/15/22 1319 16     Temp 12/15/22 1319 98 F (36.7 C)     Temp Source 12/15/22 1319 Oral     SpO2 12/15/22 1319 96 %     Weight 12/15/22 1318 245 lb (111.1 kg)     Height 12/15/22 1318 6' (1.829 m)     Head Circumference --      Peak Flow --      Pain Score 12/15/22 1317 6     Pain Loc --      Pain Edu? --      Excl. in [place]? --    No data found.  Updated Vital Signs BP (!) 174/104 (BP Location: Left Arm)   Pulse 81   Temp 98 F (36.7 C) (Oral)   Resp 16   Ht 6' (1.829 m)   Wt 245 lb (111.1 kg)   SpO2 96%   BMI 33.23 kg/m   Visual Acuity Right Eye Distance: 20/15 Left Eye Distance: 20/20 Bilateral Distance: 20/20 (Without correction)  Right Eye Near:   Left Eye Near:    Bilateral Near:     Physical Exam Vitals and nursing note reviewed.  Constitutional:      Appearance: Normal appearance. He is not ill-appearing.  HENT:     Head: Normocephalic and atraumatic.  Eyes:     General: No scleral icterus.       Left eye: Discharge present.    Extraocular Movements: Extraocular movements intact.     Conjunctiva/sclera: Conjunctivae normal.      Pupils: Pupils are equal, round, and reactive to light.  Skin:    General: Skin is warm and dry.     Capillary Refill: Capillary refill takes less than 2 seconds.  Findings: Erythema present.  Neurological:     General: No focal deficit present.     Mental Status: He is alert and oriented to person, place, and time.  Psychiatric:        Mood and Affect: Mood normal.        Behavior: Behavior normal.        Thought Content: Thought content normal.        Judgment: Judgment normal.      UC Treatments / Results  Labs (all labs ordered are listed, but only abnormal results are displayed) Labs Reviewed - No data to display  EKG   Radiology No results found.  Procedures Procedures (including critical care time)  Medications Ordered in UC Medications - No data to display  Initial Impression / Assessment and Plan / UC Course  I have reviewed the triage vital signs and the nursing notes.  Pertinent labs & imaging results that were available during my care of the patient were reviewed by me and considered in my medical decision making (see chart for details).   Patient is a nontoxic-appearing 56 old male here for evaluation of left eye irritation, upper eyelid swelling, and puslike drainage x 2 days.  As you can see in the image above patient has swelling to the upper eyelid with erythema.  No induration or fluctuance noted.  There is puslike drainage from the inner canthus.  The pupil is equal round reactive and the bulbar and labral conjunctiva are unremarkable.  This is consistent with preseptal cellulitis.  I will discharge patient home on Augmentin 875 mg twice daily with food x 10 days.  I have advised him to apply warm compresses to the area to help the infection continue to drain and resolve the infection sooner.  If he has any increase in swelling, pain, develop changes in vision, or if he develops a fever he needs to be evaluated in the ER.  Final Clinical  Impressions(s) / UC Diagnoses   Final diagnoses:  Preseptal cellulitis of left upper eyelid     Discharge Instructions      Take the Augmentin twice daily with food for 10 days for treatment of your eye infection.  Apply warm compresses to your eye to help facilitate drainage.  If you develop worsening swelling, changes in vision, or fever you need to go to the ER for evaluation.     ED Prescriptions     Medication Sig Dispense Auth. Provider   amoxicillin-clavulanate (AUGMENTIN) 875-125 MG tablet Take 1 tablet by mouth every 12 (twelve) hours for 10 days. 20 tablet Margarette Canada, NP      PDMP not reviewed this encounter.   Margarette Canada, NP 12/15/22 [phone]

## 2023-08-14 ENCOUNTER — Other Ambulatory Visit: Payer: Self-pay

## 2023-08-14 ENCOUNTER — Emergency Department: Payer: Medicare HMO

## 2023-08-14 ENCOUNTER — Emergency Department
Admission: EM | Admit: 2023-08-14 | Discharge: 2023-08-14 | Disposition: A | Discharge status: Home / Self Care | Payer: Medicare HMO | Attending: Emergency Medicine | Admitting: Emergency Medicine

## 2023-08-14 DIAGNOSIS — E119 Type 2 diabetes mellitus without complications: Secondary | ICD-10-CM | POA: Insufficient documentation

## 2023-08-14 DIAGNOSIS — I2510 Atherosclerotic heart disease of native coronary artery without angina pectoris: Secondary | ICD-10-CM | POA: Insufficient documentation

## 2023-08-14 DIAGNOSIS — R0789 Other chest pain: Secondary | ICD-10-CM | POA: Insufficient documentation

## 2023-08-14 DIAGNOSIS — I10 Essential (primary) hypertension: Secondary | ICD-10-CM | POA: Insufficient documentation

## 2023-08-14 DIAGNOSIS — R000 Tachycardia, unspecified: Secondary | ICD-10-CM | POA: Insufficient documentation

## 2023-08-14 DIAGNOSIS — Z7982 Long term (current) use of aspirin: Secondary | ICD-10-CM | POA: Diagnosis not present

## 2023-08-14 DIAGNOSIS — R079 Chest pain, unspecified: Secondary | ICD-10-CM | POA: Diagnosis present

## 2023-08-14 DIAGNOSIS — R0602 Shortness of breath: Secondary | ICD-10-CM | POA: Insufficient documentation

## 2023-08-14 DIAGNOSIS — R1110 Vomiting, unspecified: Secondary | ICD-10-CM | POA: Insufficient documentation

## 2023-08-14 LAB — BASIC METABOLIC PANEL
Anion gap: 11 (ref 5–15)
Anion gap: 13 (ref 5–15)
BUN: 10 mg/dL (ref 6–20)
BUN: 12 mg/dL (ref 6–20)
CO2: 21 mmol/L — ABNORMAL LOW (ref 22–32)
CO2: 23 mmol/L (ref 22–32)
Calcium: 9.1 mg/dL (ref 8.9–10.3)
Calcium: 9.1 mg/dL (ref 8.9–10.3)
Chloride: 101 mmol/L (ref 98–111)
Chloride: 98 mmol/L (ref 98–111)
Creatinine, Ser: 0.73 mg/dL (ref 0.61–1.24)
Creatinine, Ser: 0.8 mg/dL (ref 0.61–1.24)
GFR, Estimated: >60 mL/min (ref >60)
GFR, Estimated: >60 mL/min (ref >60)
Glucose, Bld: 275 mg/dL — ABNORMAL HIGH (ref 70–99)
Glucose, Bld: 335 mg/dL — ABNORMAL HIGH (ref 70–99)
Potassium: 3.7 mmol/L (ref 3.5–5.1)
Potassium: 3.3 mmol/L — ABNORMAL LOW (ref 3.5–5.1)
Sodium: 133 mmol/L — ABNORMAL LOW (ref 135–145)
Sodium: 134 mmol/L — ABNORMAL LOW (ref 135–145)

## 2023-08-14 LAB — CBC
HCT: 46 % (ref 39.0–52.0)
HCT: 47.3 % (ref 39.0–52.0)
Hemoglobin: 17.1 g/dL — ABNORMAL HIGH (ref 13.0–17.0)
Hemoglobin: 17 g/dL (ref 13.0–17.0)
MCH: 31.8 pg (ref 26.0–34.0)
MCH: 31.9 pg (ref 26.0–34.0)
MCHC: 37.2 g/dL — ABNORMAL HIGH (ref 30.0–36.0)
MCHC: 35.9 g/dL (ref 30.0–36.0)
MCV: 85.5 fL (ref 80.0–100.0)
MCV: 88.7 fL (ref 80.0–100.0)
Platelets: 239 10*3/uL (ref 150–400)
Platelets: 251 10*3/uL (ref 150–400)
RBC: 5.38 MIL/uL (ref 4.22–5.81)
RBC: 5.33 MIL/uL (ref 4.22–5.81)
RDW: 11.8 % (ref 11.5–15.5)
RDW: 11.9 % (ref 11.5–15.5)
WBC: 9.7 10*3/uL (ref 4.0–10.5)
WBC: 12.3 10*3/uL — ABNORMAL HIGH (ref 4.0–10.5)
nRBC: 0 % (ref 0.0–0.2)
nRBC: 0 % (ref 0.0–0.2)

## 2023-08-14 LAB — D-DIMER, QUANTITATIVE: D-Dimer, Quant: 0.58 ug/mL-FEU — ABNORMAL HIGH (ref 0.00–0.50)

## 2023-08-14 LAB — TROPONIN I (HIGH SENSITIVITY)
Troponin I (High Sensitivity): 5 ng/L (ref <18)
Troponin I (High Sensitivity): 5 ng/L (ref <18)
Troponin I (High Sensitivity): 4 ng/L (ref <18)

## 2023-08-14 MED ORDER — MORPHINE SULFATE (PF) 4 MG/ML IV SOLN
4.0000 mg | Freq: Once | INTRAVENOUS | Status: AC
  Administered 2023-08-14: 4 mg via INTRAVENOUS
  Filled 2023-08-14: qty 1

## 2023-08-14 NOTE — ED Triage Notes (Signed)
Pt presents via POV c/o chest pain, SOB and syncopal episode prior to arrival. Reports seen here today for SOB/CP and was d/c. Reports some symptoms improvement after being d/c earlier today.

## 2023-08-14 NOTE — Discharge Instructions (Signed)
Please seek medical attention for any high fevers, chest pain, shortness of breath, change in behavior, persistent vomiting, bloody stool or any other new or concerning symptoms.  

## 2023-08-14 NOTE — ED Triage Notes (Signed)
Pt arrives via [facility] for midsternal CP that radiates to his back and L arm. Pt states this pain woke him up at 0530 and he also had episode of emesis at that time. Pt had three NTG spray and one inch of nitroglycerin paste applied by EMS as well as 324 mg ASA. Pt with hx of unstable angina as well as MI

## 2023-08-14 NOTE — ED Provider Notes (Signed)
[facility] Provider Note    Event Date/Time   First MD Initiated Contact with Patient 08/14/23 1417     (approximate)   History   Chest Pain   HPI  Caleb [name] is a 47 y.o. male who presents to the emergency department today because of concerns for chest pain.  Pain started this morning.  He states he felt fine when he went to bed last night and did not do any unusual activity or exertion.  Pain is located in the left side of his chest and radiates to his left arm and his back.  Patient did have some emesis with this.  Did have some slight shortness of breath.  Was given nitroglycerin by EMS as well as aspirin.  The nitroglycerin did give some temporary relief.  Patient does have a history of CAD.     Physical Exam   Triage Vital Signs: ED Triage Vitals  Encounter Vitals Group     BP 08/14/23 1306 126/86     Systolic BP Percentile --      Diastolic BP Percentile --      Pulse Rate 08/14/23 1306 97     Resp 08/14/23 1306 20     Temp 08/14/23 1306 98.5 F (36.9 C)     Temp Source 08/14/23 1306 Oral     SpO2 08/14/23 1306 98 %     Weight --      Height --      Head Circumference --      Peak Flow --      Pain Score 08/14/23 1305 6     Pain Loc --      Pain Education --      Exclude from Growth Chart --     Most recent vital signs: Vitals:   08/14/23 1306  BP: 126/86  Pulse: 97  Resp: 20  Temp: 98.5 F (36.9 C)  SpO2: 98%    General: Awake, alert, oriented. CV:  Good peripheral perfusion. Regular rate and rhythm. Resp:  Normal effort. Lungs clear. Abd:  No distention.  Other:  No lower extremity edema.   ED Results / Procedures / Treatments   Labs (all labs ordered are listed, but only abnormal results are displayed) Labs Reviewed  BASIC METABOLIC PANEL - Abnormal; Notable for the following components:      Result Value   Sodium 133 (*)    CO2 21 (*)    Glucose, Bld 275 (*)    All other components within normal  limits  CBC - Abnormal; Notable for the following components:   Hemoglobin 17.1 (*)    MCHC 37.2 (*)    All other components within normal limits  D-DIMER, QUANTITATIVE - Abnormal; Notable for the following components:   D-Dimer, Quant 0.58 (*)    All other components within normal limits  TROPONIN I (HIGH SENSITIVITY)  TROPONIN I (HIGH SENSITIVITY)     EKG  I, Phineas Semen, attending physician, personally viewed and interpreted this EKG  EKG Time: 1308 Rate: 97 Rhythm: normal sinus rhythm Axis: normal Intervals: qtc 452 QRS: narrow, q waves III, V1, V2 ST changes: no st elevation Impression: abnormal ekg   RADIOLOGY  I independently interpreted and visualized the CXR. My interpretation: No pneumonia Radiology interpretation:  IMPRESSION:  No acute cardiopulmonary disease.     PROCEDURES:  Critical Care performed: No  MEDICATIONS ORDERED IN ED: Medications - No data to display   IMPRESSION / MDM / ASSESSMENT AND  PLAN / ED COURSE  I reviewed the triage vital signs and the nursing notes.                              Differential diagnosis includes, but is not limited to, ACS, PE, pleurisy, pneumonia  Patient's presentation is most consistent with acute presentation with potential threat to life or bodily function.   The patient is on the cardiac monitor to evaluate for evidence of arrhythmia and/or significant heart rate changes.  Patient presented to the emergency department today because of concerns for chest pain.  On exam patient is neither tachycardic nor hypotensive.  The patient had troponin which was negative x 2.  Chest x-ray without pneumonia.  I did check a D-dimer which was minimally elevated.  Patient does have allergies to contrast so I did discuss with him pretreatment.  He however refuses saying that even with pretreatment he feels like he has a reaction.  We then just discussed VQ scan which the patient states he also would refuse to do since  he has tried that in the past and does not handle it well.  He states that he would be happy to follow-up with this patient.  I did discuss possible admission however at this time patient felt comfortable with discharge.  He stated he did feel better after the pain medication.  At this time I do have low suspicion for PE given lack of tachycardia or lower extremity swelling.  Will discharge.  Did discuss strict return precautions.      FINAL CLINICAL IMPRESSION(S) / ED DIAGNOSES   Final diagnoses:  Nonspecific chest pain   Note:  This document was prepared using Dragon voice recognition software and may include unintentional dictation errors.    Phineas Semen, MD 08/14/23 [phone]

## 2023-08-15 ENCOUNTER — Emergency Department: Payer: Medicare HMO

## 2023-08-15 ENCOUNTER — Emergency Department (HOSPITAL_BASED_OUTPATIENT_CLINIC_OR_DEPARTMENT_OTHER)
Admission: EM | Admit: 2023-08-15 | Discharge: 2023-08-15 | Discharge status: Against Medical Advice | Payer: Medicare HMO | Attending: Emergency Medicine | Admitting: Emergency Medicine

## 2023-08-15 DIAGNOSIS — Z716 Tobacco abuse counseling: Secondary | ICD-10-CM

## 2023-08-15 DIAGNOSIS — I10 Essential (primary) hypertension: Secondary | ICD-10-CM

## 2023-08-15 DIAGNOSIS — E1169 Type 2 diabetes mellitus with other specified complication: Secondary | ICD-10-CM

## 2023-08-15 DIAGNOSIS — F17210 Nicotine dependence, cigarettes, uncomplicated: Secondary | ICD-10-CM | POA: Diagnosis present

## 2023-08-15 DIAGNOSIS — I2510 Atherosclerotic heart disease of native coronary artery without angina pectoris: Secondary | ICD-10-CM | POA: Diagnosis not present

## 2023-08-15 DIAGNOSIS — R079 Chest pain, unspecified: Principal | ICD-10-CM | POA: Diagnosis present

## 2023-08-15 LAB — TROPONIN I (HIGH SENSITIVITY): Troponin I (High Sensitivity): 4 ng/L (ref <18)

## 2023-08-15 LAB — HEPARIN LEVEL (UNFRACTIONATED): Heparin Unfractionated: 0.91 IU/mL — ABNORMAL HIGH (ref 0.30–0.70)

## 2023-08-15 LAB — APTT: aPTT: 31 seconds (ref 24–36)

## 2023-08-15 LAB — PROTIME-INR
INR: 1.4 — ABNORMAL HIGH (ref 0.8–1.2)
Prothrombin Time: 17.1 seconds — ABNORMAL HIGH (ref 11.4–15.2)

## 2023-08-15 MED ORDER — IOHEXOL 350 MG/ML SOLN
100.0000 mL | Freq: Once | INTRAVENOUS | Status: AC | PRN
  Administered 2023-08-15: 75 mL via INTRAVENOUS

## 2023-08-15 MED ORDER — ONDANSETRON HCL 4 MG/2ML IJ SOLN: 4.0000 mg | Freq: Four times a day (QID) | INTRAMUSCULAR | Status: DC | PRN

## 2023-08-15 MED ORDER — INSULIN ASPART 100 UNIT/ML IJ SOLN: 4.0000 [IU] | Freq: Three times a day (TID) | INTRAMUSCULAR | Status: DC

## 2023-08-15 MED ORDER — DIPHENHYDRAMINE HCL 25 MG PO CAPS: 50.0000 mg | ORAL_CAPSULE | Freq: Once | ORAL | Status: AC

## 2023-08-15 MED ORDER — ONDANSETRON HCL 4 MG/2ML IJ SOLN
4.0000 mg | Freq: Once | INTRAMUSCULAR | Status: AC
  Administered 2023-08-15: 4 mg via INTRAVENOUS
  Filled 2023-08-15: qty 2

## 2023-08-15 MED ORDER — OXYCODONE-ACETAMINOPHEN 5-325 MG PO TABS
1.0000 | ORAL_TABLET | Freq: Once | ORAL | Status: AC
  Administered 2023-08-15: 1 via ORAL
  Filled 2023-08-15: qty 1

## 2023-08-15 MED ORDER — HEPARIN BOLUS VIA INFUSION
5000.0000 [IU] | Freq: Once | INTRAVENOUS | Status: AC
  Administered 2023-08-15: 5000 [IU] via INTRAVENOUS
  Filled 2023-08-15: qty 5000

## 2023-08-15 MED ORDER — ACETAMINOPHEN 325 MG PO TABS: 650.0000 mg | ORAL_TABLET | ORAL | Status: DC | PRN

## 2023-08-15 MED ORDER — INSULIN ASPART 100 UNIT/ML IJ SOLN: 0.0000 [IU] | Freq: Every day | INTRAMUSCULAR | Status: DC

## 2023-08-15 MED ORDER — MORPHINE SULFATE (PF) 4 MG/ML IV SOLN
4.0000 mg | Freq: Once | INTRAVENOUS | Status: AC
  Administered 2023-08-15: 4 mg via INTRAVENOUS
  Filled 2023-08-15: qty 1

## 2023-08-15 MED ORDER — METHYLPREDNISOLONE SODIUM SUCC 40 MG IJ SOLR
40.0000 mg | Freq: Once | INTRAMUSCULAR | Status: AC
  Administered 2023-08-15: 40 mg via INTRAVENOUS
  Filled 2023-08-15: qty 1

## 2023-08-15 MED ORDER — SODIUM CHLORIDE 0.9 % IV BOLUS
1000.0000 mL | Freq: Once | INTRAVENOUS | Status: AC
  Administered 2023-08-15: 1000 mL via INTRAVENOUS

## 2023-08-15 MED ORDER — DIPHENHYDRAMINE HCL 50 MG/ML IJ SOLN
50.0000 mg | Freq: Once | INTRAMUSCULAR | Status: AC
  Administered 2023-08-15: 50 mg via INTRAVENOUS
  Filled 2023-08-15: qty 1

## 2023-08-15 MED ORDER — INSULIN ASPART 100 UNIT/ML IJ SOLN: 0.0000 [IU] | Freq: Three times a day (TID) | INTRAMUSCULAR | Status: DC

## 2023-08-15 MED ORDER — HEPARIN (PORCINE) 25000 UT/250ML-% IV SOLN
1500.0000 [IU]/h | INTRAVENOUS | Status: DC
  Administered 2023-08-15: 1700 [IU]/h via INTRAVENOUS
  Filled 2023-08-15: qty 250

## 2023-08-15 NOTE — Assessment & Plan Note (Signed)
Patient with recurrent moderate to severe central chest pain x 24 hours Noted baseline history of coronary disease status post DES to LAD in 09/2019  Still smoking ?  Partial compliance of medication regimen Troponin negative x 2 CTA negative for PE Started on heparin drip in the ER Initial plan was for observation overnight with risk stratification and likely stress test versus cardiac catheterization However, patient left the hospital AGAINST MEDICAL ADVICE Patient left before risks including death could be discussed

## 2023-08-15 NOTE — Consult Note (Signed)
Initial Consultation Note   Patient: Caleb [name] ZOX:[id number] DOB: 05/12/1976 PCP: Abbott Pao, MD DOA: 08/15/2023 DOS: the patient was seen and examined on 08/15/2023 Primary service: No att. providers found  Referring physician: Larinda Buttery MD  Reason for consult: Chest pain   Assessment/Plan: Assessment and Plan: * Chest pain Patient with recurrent moderate to severe central chest pain x 24 hours Noted baseline history of coronary disease status post DES to LAD in 09/2019  Still smoking ?  Partial compliance of medication regimen Troponin negative x 2 CTA negative for PE Started on heparin drip in the ER Initial plan was for observation overnight with risk stratification and likely stress test versus cardiac catheterization However, patient left the hospital AGAINST MEDICAL ADVICE Patient left before risks including death could be discussed  Coronary artery disease involving native coronary artery of native heart Baseline coronary disease s/p DES to LAD in 09/2019 with active chest pain Was pending formal cardiac evaluation Patient left the hospital AGAINST MEDICAL ADVICE  Nicotine dependence, cigarettes, uncomplicated 1/2 to 1 pack/day smoker        TRH will sign off at present, please call us again when needed.  HPI: Caleb [name] is a 47 y.o. male with past medical history of hypertension, chronic pain, CAD status post stenting to the LAD November 2020, history of DVT, alcohol abuse, tobacco use, PTSD presenting with chest pain.  Patient reports recurrent chest pain over the past 1 to 2 days.  Central in nature.  Moderate to severe in intensity.  Mild associated nausea and diaphoresis.  No abdominal pain.  No focal hemiparesis or confusion.  Baseline history of coronary disease status post stenting.  Chest pain similar to prior episodes that required cardiac evaluation in the past.  Still smoking.  Reports regular drinking though he quit 1 week ago. .   Presented to the ER afebrile, hemodynamically stable.  Satting well on room air.  White count 12.3, hemoglobin 17, platelets 251, troponin negative x 2.  Glucose 335, D-dimer 0.6.  CTA negative for PE.  EKG normal sinus rhythm with nonspecific ST and T wave changes.  Initially started on a heparin drip in the ER.  Patient left the hospital AGAINST MEDICAL ADVICE after my evaluation in the ER.   Greater than 50% was spent in counseling and coordination of care with patient Total encounter time 80 minutes or more  Review of Systems: As mentioned in the history of present illness. All other systems reviewed and are negative. Past Medical History:  Diagnosis Date   Anxiety    Arthritis    Chronic pain    Diabetes mellitus without complication (HCC)    GERD (gastroesophageal reflux disease)    History of degenerative disc disease    Hypercholesteremia    Hypertension    Knee pain, bilateral    Myocardial infarct [facility])    PTSD (post-traumatic stress disorder)    Pulmonary embolism (HCC)    Sleep apnea    Stroke (HCC) 2015   mold stroke per patient   Past Surgical History:  Procedure Laterality Date   ANGIOPLASTY / STENTING FEMORAL     2 stents   COLONOSCOPY WITH PROPOFOL N/A 05/07/2017   Procedure: COLONOSCOPY WITH PROPOFOL;  Surgeon: Christena Deem, MD;  Location: [facility] ENDOSCOPY;  Service: Endoscopy;  Laterality: N/A;   COLONOSCOPY WITH PROPOFOL N/A 04/11/2021   Procedure: COLONOSCOPY WITH PROPOFOL;  Surgeon: Pasty Spillers, MD;  Location: [facility] ENDOSCOPY;  Service: Endoscopy;  Laterality: N/A;  COLONOSCOPY WITH PROPOFOL N/A 03/21/2022   Procedure: COLONOSCOPY WITH PROPOFOL;  Surgeon: Toney Reil, MD;  Location: [facility] ENDOSCOPY;  Service: Gastroenterology;  Laterality: N/A;   ESOPHAGOGASTRODUODENOSCOPY (EGD) WITH PROPOFOL N/A 05/07/2017   Procedure: ESOPHAGOGASTRODUODENOSCOPY (EGD) WITH PROPOFOL;  Surgeon: Christena Deem, MD;  Location: [facility] ENDOSCOPY;  Service:  Endoscopy;  Laterality: N/A;   HERNIA REPAIR     Knee surgeries     TONSILLECTOMY     Social History:  reports that he has been smoking cigarettes. He started smoking about 21 years ago. He has a 20 pack-year smoking history. He has never used smokeless tobacco. He reports current alcohol use of about 4.0 standard drinks of alcohol per week. He reports that he does not currently use drugs.  Allergies  Allergen Reactions   Bee Venom Anaphylaxis   Iodinated Contrast Media Anaphylaxis and Swelling   Metformin And Related Anaphylaxis    Unknown    Lisinopril Swelling    Family History  Problem Relation Age of Onset   Diabetes Mother    Cancer Paternal Grandmother    Cancer Paternal Grandfather    Heart attack Maternal Grandfather 50    Prior to Admission medications   Medication Sig Start Date End Date Taking? Authorizing Provider  amLODipine (NORVASC) 10 MG tablet Take 10 mg by mouth daily.   Yes [provider]  aspirin EC 81 MG tablet Take 81 mg by mouth daily.   Yes [provider]  atorvastatin (LIPITOR) 80 MG tablet Take 80 mg by mouth daily. 06/05/21 08/15/23 Yes [provider]  cetirizine (ZYRTEC) 10 MG tablet Take 10 mg by mouth daily.   Yes [provider]  clopidogrel (PLAVIX) 75 MG tablet Take 75 mg by mouth daily. 06/14/20  Yes [provider]  diphenhydrAMINE (BENADRYL) 25 mg capsule Take 50 mg by mouth every 6 (six) hours as needed for itching or allergies.   Yes [provider]  EPINEPHrine 0.3 mg/0.3 mL IJ SOAJ injection Inject into the muscle. 09/24/19  Yes [provider]  esomeprazole (NEXIUM) 20 MG capsule Take 20 mg by mouth daily as needed.   Yes [provider]  glimepiride (AMARYL) 4 MG tablet Take 4 mg by mouth in the morning and at bedtime. 06/14/23 06/08/24 Yes [provider]  losartan-hydrochlorothiazide (HYZAAR) 100-25 MG tablet Take 1 tablet by mouth daily. 07/21/23  Yes  [provider]  metoprolol tartrate (LOPRESSOR) 25 MG tablet Take 25 mg by mouth daily. 07/21/23  Yes [provider]  oxyCODONE (ROXICODONE) 15 MG immediate release tablet Take 15 mg by mouth every 4 (four) hours as needed for pain.   Yes [provider]  Accu-Chek Softclix Lancets lancets 1 each 3 (three) times daily. 01/17/22   [provider]  amLODipine (NORVASC) 5 MG tablet Take 5 mg by mouth daily. 12/17/20 02/10/22  [provider]  Blood Glucose Monitoring Suppl (ACCU-CHEK GUIDE ME) w/Device KIT See admin instructions. 01/17/22   [provider]  cyclobenzaprine (FLEXERIL) 5 MG tablet Take 1 tablet (5 mg total) by mouth 3 (three) times daily as needed. Patient not taking: Reported on 08/15/2023 08/31/22   Menshew, Charlesetta Ivory, PA-C  diazepam (VALIUM) 5 MG tablet Take 5 mg by mouth every 6 (six) hours as needed. Patient not taking: Reported on 08/15/2023 03/20/22   [provider]  glimepiride (AMARYL) 2 MG tablet Take 2-4 mg by mouth 2 (two) times daily. Patient not taking: Reported on 08/15/2023 01/17/22  [provider]  glucose blood (FREESTYLE INSULINX TEST) test strip daily. 04/25/21   [provider]  isosorbide mononitrate (IMDUR) 30 MG 24 hr tablet Take 1 tablet (30 mg total) by mouth daily. Patient not taking: Reported on 08/15/2023 02/11/22 06/10/22  Charise Killian, MD  Lancets (FREESTYLE) lancets daily. 04/25/21   [provider]  metoprolol succinate (TOPROL-XL) 50 MG 24 hr tablet Take 50 mg by mouth daily. Patient not taking: Reported on 08/15/2023 12/24/17   [provider]  nortriptyline (PAMELOR) 25 MG capsule Take 25 mg by mouth at bedtime. Patient not taking: Reported on 08/15/2023 10/25/21   [provider]  nystatin (MYCOSTATIN) 100000 UNIT/ML suspension Take 5 mLs (500,000 Units total) by mouth in the morning, at noon, and at bedtime. For 7 days, then prn  recurrence Patient not taking: Reported on 08/15/2023 06/10/22   Rodriguez-Southworth, Nettie Elm, PA-C  Semaglutide,0.25 or 0.5MG /DOS, 2 MG/3ML SOPN Inject 0.25 mg into the skin once a week. Patient not taking: Reported on 08/15/2023 05/10/22   Lurene Shadow, MD  trimethoprim-polymyxin b (POLYTRIM) ophthalmic solution Place 2 drops into both eyes 3 (three) times daily. For 7 days Patient not taking: Reported on 08/15/2023 06/10/22   Garey Ham, PA-C    Physical Exam: Vitals:   08/15/23 0700 08/15/23 0730 08/15/23 0830 08/15/23 0900  BP: 136/87 (!) 146/82 (!) 133/98 (!) 140/93  Pulse: 84 92 84 96  Resp: 13 14 16  (!) 32  Temp:      TempSrc:      SpO2: 96% 95% 94% 94%  Weight:      Height:       Physical Exam Constitutional:      Appearance: He is obese.  HENT:     Head: Normocephalic and atraumatic.     Nose: Nose normal.     Mouth/Throat:     Mouth: Mucous membranes are moist.  Eyes:     Pupils: Pupils are equal, round, and reactive to light.  Cardiovascular:     Rate and Rhythm: Normal rate and regular rhythm.  Pulmonary:     Effort: Pulmonary effort is normal.  Abdominal:     General: Bowel sounds are normal.  Musculoskeletal:        General: Normal range of motion.  Skin:    General: Skin is warm.  Neurological:     General: No focal deficit present.  Psychiatric:        Mood and Affect: Mood normal.     Data Reviewed:   There are no new results to review at this time.    Family Communication: No family at the bedside  Primary team communication: Pt left hospital AMA  Thank you very much for involving [place] in the care of your patient.  Author: Floydene Flock, MD 08/15/2023 10:18 AM  For on call review [email or web address].

## 2023-08-15 NOTE — ED Provider Notes (Signed)
[facility] Provider Note    Event Date/Time   First MD Initiated Contact with Patient 08/15/23 0031     (approximate)   History   Chief Complaint Shortness of Breath and Loss of Consciousness   HPI  Caleb [name] is a 47 y.o. male with past medical history of hypertension, hyperlipidemia, diabetes, PE, CAD, stroke, and alcohol abuse who presents to the ED complaining of shortness of breath.  Patient reports that earlier today he was woken from sleep with sharp pain in the center of his chest that was radiating towards his back.  This been associated with some difficulty breathing and he was evaluated here in the ED earlier in the afternoon.  Workup at that time was remarkable for mildly elevated D-dimer, cardiac markers were within normal limits.  Patient was offered CTA of his chest, but declined and was discharged home with improving symptoms.  He states that the pain in his chest worsened earlier this evening and he had a syncopal episode, twisting his left knee but denies hitting his head.  He states that the pain in his chest has improved again, but he continues to have some difficulty breathing.     Physical Exam   Triage Vital Signs: ED Triage Vitals  Encounter Vitals Group     BP 08/14/23 2245 (!) 169/99     Systolic BP Percentile --      Diastolic BP Percentile --      Pulse Rate 08/14/23 2245 (!) 102     Resp 08/14/23 2245 (!) 24     Temp 08/14/23 2245 98.2 F (36.8 C)     Temp Source 08/14/23 2245 Oral     SpO2 08/14/23 2245 99 %     Weight 08/14/23 2246 240 lb (108.9 kg)     Height 08/14/23 2246 6' (1.829 m)     Head Circumference --      Peak Flow --      Pain Score 08/14/23 2246 8     Pain Loc --      Pain Education --      Exclude from Growth Chart --     Most recent vital signs: Vitals:   08/15/23 0530 08/15/23 0540  BP: 122/82   Pulse: 92   Resp: 19   Temp:  98 F (36.7 C)  SpO2: 93%     Constitutional: Alert  and oriented. Eyes: Conjunctivae are normal. Head: Atraumatic. Nose: No congestion/rhinnorhea. Mouth/Throat: Mucous membranes are moist.  Cardiovascular: Tachycardic, regular rhythm. Grossly normal heart sounds.  2+ radial pulses bilaterally. Respiratory: Normal respiratory effort.  No retractions. Lungs CTAB. Gastrointestinal: Soft and nontender. No distention. Musculoskeletal: Diffuse tenderness to palpation at left knee with no obvious deformity. Neurologic:  Normal speech and language. No gross focal neurologic deficits are appreciated.    ED Results / Procedures / Treatments   Labs (all labs ordered are listed, but only abnormal results are displayed) Labs Reviewed  BASIC METABOLIC PANEL - Abnormal; Notable for the following components:      Result Value   Sodium 134 (*)    Potassium 3.3 (*)    Glucose, Bld 335 (*)    All other components within normal limits  CBC - Abnormal; Notable for the following components:   WBC 12.3 (*)    All other components within normal limits  PROTIME-INR - Abnormal; Notable for the following components:   Prothrombin Time 17.1 (*)    INR 1.4 (*)  All other components within normal limits  APTT  HEPARIN LEVEL (UNFRACTIONATED)  TROPONIN I (HIGH SENSITIVITY)  TROPONIN I (HIGH SENSITIVITY)     EKG  ED ECG REPORT I, Chesley Noon, the attending physician, personally viewed and interpreted this ECG.   Date: 08/15/2023  EKG Time: 22:46  Rate: 97  Rhythm: normal sinus rhythm  Axis: Normal  Intervals:none  ST&T Change: Inferolateral ST depressions  RADIOLOGY CTA chest reviewed and interpreted by me with no pulmonary embolism or focal infiltrate.  PROCEDURES:  Critical Care performed: No  Procedures   MEDICATIONS ORDERED IN ED: Medications  heparin ADULT infusion 100 units/mL (25000 units/223mL) (1,700 Units/hr Intravenous New Bag/Given 08/15/23 0212)  methylPREDNISolone sodium succinate (SOLU-MEDROL) 40 mg/mL injection 40 mg  (40 mg Intravenous Given 08/15/23 0107)  diphenhydrAMINE (BENADRYL) capsule 50 mg ( Oral See Alternative 08/15/23 0404)    Or  diphenhydrAMINE (BENADRYL) injection 50 mg (50 mg Intravenous Given 08/15/23 0404)  sodium chloride 0.9 % bolus 1,000 mL (0 mLs Intravenous Stopped 08/15/23 0240)  heparin bolus via infusion 5,000 Units (5,000 Units Intravenous Bolus from Bag 08/15/23 0212)  oxyCODONE-acetaminophen (PERCOCET/ROXICET) 5-325 MG per tablet 1 tablet (1 tablet Oral Given 08/15/23 0158)  ondansetron (ZOFRAN) injection 4 mg (4 mg Intravenous Given 08/15/23 0244)  morphine (PF) 4 MG/ML injection 4 mg (4 mg Intravenous Given 08/15/23 0330)  iohexol (OMNIPAQUE) 350 MG/ML injection 100 mL (75 mLs Intravenous Contrast Given 08/15/23 0510)     IMPRESSION / MDM / ASSESSMENT AND PLAN / ED COURSE  I reviewed the triage vital signs and the nursing notes.                              47 y.o. male with past medical history of hypertension, hyperlipidemia, diabetes, stroke, PE, and alcohol abuse who presents to the ED complaining of sharp pain in his chest with difficulty breathing since this morning, had a syncopal episode prior to arrival.  Patient's presentation is most consistent with acute presentation with potential threat to life or bodily function.  Differential diagnosis includes, but is not limited to, ACS, PE, arrhythmia, anemia, electrolyte abnormality, AKI, vasovagal episode, musculoskeletal pain, GERD, anxiety.  Patient nontoxic-appearing and in no acute distress, vital signs remarkable for mild tachycardia and tachypnea, but he is not in any respiratory distress and maintaining oxygen saturations on room air.  EKG shows normal sinus rhythm, patient does have mild ST depressions inferolaterally that appear new from his ED visit earlier today.  Initial troponin within normal limits and we will trend.  At this point, he is high risk for PE and I have recommended to him that we proceed with either CT  imaging or VQ scan.  Patient agreeable to proceed with CTA of his chest following pretreatment with IV Solu-Medrol and Benadryl given his contrast allergy.  Additional labs show mild leukocytosis and hyperglycemia, no anemia, electrolyte abnormality, or AKI.  CTA chest performed and negative for acute process, no evidence of PE.  Patient with no allergic reaction following pretreatment, chest pain improved on reassessment.  Second troponin within normal limits, however with his EKG changes and significant cardiac history, patient at high risk for ACS.  He was started on IV heparin and case discussed with hospitalist for admission.      FINAL CLINICAL IMPRESSION(S) / ED DIAGNOSES   Final diagnoses:  Chest pain with high risk for cardiac etiology     Rx / DC Orders  ED Discharge Orders     None        Note:  This document was prepared using Dragon voice recognition software and may include unintentional dictation errors.   Chesley Noon, MD 08/15/23 [phone]

## 2023-08-15 NOTE — Progress Notes (Signed)
ANTICOAGULATION CONSULT NOTE - Initial Consult  Pharmacy Consult for Heparin  Indication: pulmonary embolus  Allergies  Allergen Reactions   Bee Venom Anaphylaxis   Iodinated Contrast Media Anaphylaxis and Swelling   Metformin And Related Anaphylaxis    Unknown    Lisinopril Swelling    Patient Measurements: Height: 6' (182.9 cm) Weight: 108.9 kg (240 lb) IBW/kg (Calculated) : 77.6 Heparin Dosing Weight: 100.6 kg   Vital Signs: Temp: 98.2 F (36.8 C) (09/24 2245) Temp Source: Oral (09/24 2245) BP: 169/99 (09/24 2245) Pulse Rate: 102 (09/24 2245)  Labs: Recent Labs    08/14/23 1308 08/14/23 1512 08/14/23 2248  HGB 17.1*  --  17.0  HCT 46.0  --  47.3  PLT 239  --  251  CREATININE 0.73  --  0.80  TROPONINIHS 5 5 4     Estimated Creatinine Clearance: 147 mL/min (by C-G formula based on SCr of 0.8 mg/dL).   Medical History: Past Medical History:  Diagnosis Date   Anxiety    Arthritis    Chronic pain    Diabetes mellitus without complication (HCC)    GERD (gastroesophageal reflux disease)    History of degenerative disc disease    Hypercholesteremia    Hypertension    Knee pain, bilateral    Myocardial infarct [facility])    PTSD (post-traumatic stress disorder)    Pulmonary embolism (HCC)    Sleep apnea    Stroke (HCC) 2015   mold stroke per patient    Medications:  (Not in a hospital admission)   Assessment: Pharmacy consulted to dose heparin in this 47 year old admitted with PE.  No prior anticoag noted. CrCl = 147 ml/min   Goal of Therapy:  Heparin level 0.3-0.7 units/ml Monitor platelets by anticoagulation protocol: Yes   Plan:  Give 5000 units bolus x 1 Start heparin infusion at 1700 units/hr Check anti-Xa level in 6 hours and daily while on heparin Continue to monitor H&H and platelets  Zerek Litsey D 08/15/2023,1:18 AM

## 2023-08-15 NOTE — Assessment & Plan Note (Signed)
Baseline coronary disease s/p DES to LAD in 09/2019 with active chest pain Was pending formal cardiac evaluation Patient left the hospital AGAINST MEDICAL ADVICE

## 2023-08-15 NOTE — Assessment & Plan Note (Signed)
1/2 to 1 pack/day smoker

## 2023-08-15 NOTE — ED Notes (Signed)
Patient resting in bed free from sign of distress. Breathing unlabored speaking in full sentences with symmetric chest rise and fall. Bed low and locked with side rails raised x2. Call bell in reach and monitor in place.

## 2023-08-15 NOTE — ED Notes (Signed)
Patient resting in bed free from sign of distress. Breathing unlabored speaking in full sentences with symmetric chest rise and fall. Bed low and locked with side rails raised x2. Call bell in reach and monitor in place.   

## 2023-08-15 NOTE — ED Notes (Signed)
Patient resting in bed free from sign of distress. Breathing unlabored speaking in full sentences with symmetric chest rise and fall. Bed low and locked with side rails raised x2. Call bell in reach and monitor in place.   Patient provided with water

## 2023-08-15 NOTE — Progress Notes (Signed)
ANTICOAGULATION CONSULT NOTE - Initial Consult  Pharmacy Consult for Heparin  Indication: pulmonary embolus  Allergies  Allergen Reactions   Bee Venom Anaphylaxis   Iodinated Contrast Media Anaphylaxis and Swelling   Metformin And Related Anaphylaxis    Unknown    Lisinopril Swelling    Patient Measurements: Height: 6' (182.9 cm) Weight: 108.9 kg (240 lb) IBW/kg (Calculated) : 77.6 Heparin Dosing Weight: 100.6 kg   Vital Signs: Temp: 98 F (36.7 C) (09/25 0540) Temp Source: Oral (09/25 0540) BP: 146/82 (09/25 0730) Pulse Rate: 92 (09/25 0730)  Labs: Recent Labs    08/14/23 1308 08/14/23 1512 08/14/23 2248 08/15/23 0105 08/15/23 0127 08/15/23 0803  HGB 17.1*  --  17.0  --   --   --   HCT 46.0  --  47.3  --   --   --   PLT 239  --  251  --   --   --   APTT  --   --   --   --  31  --   LABPROT  --   --   --   --  17.1*  --   INR  --   --   --   --  1.4*  --   HEPARINUNFRC  --   --   --   --   --  0.91*  CREATININE 0.73  --  0.80  --   --   --   TROPONINIHS 5 5 4 4   --   --     Estimated Creatinine Clearance: 147 mL/min (by C-G formula based on SCr of 0.8 mg/dL).   Medical History: Past Medical History:  Diagnosis Date   Anxiety    Arthritis    Chronic pain    Diabetes mellitus without complication (HCC)    GERD (gastroesophageal reflux disease)    History of degenerative disc disease    Hypercholesteremia    Hypertension    Knee pain, bilateral    Myocardial infarct [facility])    PTSD (post-traumatic stress disorder)    Pulmonary embolism (HCC)    Sleep apnea    Stroke (HCC) 2015   mold stroke per patient    Medications:  (Not in a hospital admission)   Assessment: Pharmacy consulted to dose heparin in this 47 year old admitted with PE.  No prior anticoag noted. CrCl = 147 ml/min   Goal of Therapy:  Heparin level 0.3-0.7 units/ml Monitor platelets by anticoagulation protocol: Yes  Date/Time: HL: Rate: 9/25@0803  0.91 Supratherapeutic@1700   units/hr   Plan:  Decrease heparin infusion to 1500 units/hr Check anti-Xa level in 6 hours after rate change Continue to monitor H&H and platelets  Courteney Alderete A Kandon Hosking 08/15/2023,8:55 AM

## 2023-08-15 NOTE — ED Notes (Signed)
Patient left AMA. Risks associated with him leaving AMA were discussed with him by this RN including, but not limited to, myocardial infarction, heart failure, stroke, and death. Patient verbalized understanding and AMA papers were signed. Patient left emergency department with an upright steady gait.

## 2024-02-27 ENCOUNTER — Ambulatory Visit: Attending: Cardiology | Admitting: Cardiology

## 2024-02-27 ENCOUNTER — Telehealth: Payer: Self-pay

## 2024-02-27 ENCOUNTER — Ambulatory Visit: Admitting: Cardiology

## 2024-02-27 VITALS — BP 104/60 | HR 85 | Ht 72.0 in | Wt 254.5 lb

## 2024-02-27 DIAGNOSIS — R42 Dizziness and giddiness: Secondary | ICD-10-CM

## 2024-02-27 DIAGNOSIS — E782 Mixed hyperlipidemia: Secondary | ICD-10-CM

## 2024-02-27 DIAGNOSIS — Z951 Presence of aortocoronary bypass graft: Secondary | ICD-10-CM

## 2024-02-27 DIAGNOSIS — I10 Essential (primary) hypertension: Secondary | ICD-10-CM

## 2024-02-27 MED ORDER — EZETIMIBE 10 MG PO TABS: 10.0000 mg | ORAL_TABLET | Freq: Every day | ORAL | 3 refills | Status: AC

## 2024-02-27 MED ORDER — AMLODIPINE BESYLATE 5 MG PO TABS: 5.0000 mg | ORAL_TABLET | Freq: Every day | ORAL | 3 refills | Status: AC

## 2024-02-27 NOTE — Patient Instructions (Signed)
 Medication Instructions:  Your physician recommends the following medication changes.  START: Zetia 10 mg daily   DECREASE: Amlodipine from 10 mg daily to 5 mg daily   *If you need a refill on your cardiac medications before your next appointment, please call your pharmacy*  Lab Work: No labs ordered today.  Testing/Procedures: No test ordered today.  Follow-Up: At [facility], you and your health needs are our priority.  As part of our continuing mission to provide you with exceptional heart care, our providers are all part of one team.  This team includes your primary Cardiologist (physician) and Advanced Practice Providers or APPs (Physician Assistants and Nurse Practitioners) who all work together to provide you with the care you need, when you need it.  Your next appointment:   3 month(s)  Provider:   You may see Dr. Azucena Cecil or one of the following Advanced Practice Providers on your designated Care Team:   Nicolasa Ducking, NP Ames Dura, PA-C Eula Listen, PA-C Cadence [place], PA-C Charlsie Quest, NP Carlos Levering, NP    We recommend signing up for the patient portal called "MyChart".  Sign up information is provided on this After Visit Summary.  MyChart is used to connect with patients for Virtual Visits (Telemedicine).  Patients are able to view lab/test results, encounter notes, upcoming appointments, etc.  Non-urgent messages can be sent to your provider as well.   To learn more about what you can do with MyChart, go to [email or web address].

## 2024-02-27 NOTE — Telephone Encounter (Signed)
 Called pt for missed appointment for post CABG follow up. Pt stated "I have had a lot of appointments lately and I forgot" and asked if he could reschedule appointment. Appt rescheduled for same day, today 4/9 at 1140 AM.

## 2024-02-27 NOTE — Progress Notes (Signed)
 Cardiology Office Note:    Date:  02/27/2024   ID:  Caleb [name], DOB Feb 21, 1976, MRN [id number]  PCP:  Abbott Pao, MD    HeartCare Providers Cardiologist:  Lorine Bears, MD     Referring MD: Abbott Pao, MD   Chief Complaint  Patient presents with   New Patient (Initial Visit)    Establish care for CAD/CABG x 3 & stent placement x 2. Patient c/o chest pain that comes and goes, shortness of breath & bilateral LE edema.     History of Present Illness:    Caleb [name] is a 48 y.o. male with a hx of CAD s/p PCI to mLAD 2020, CABG x 3 in 10/2023 (LIMA-LAD, SVG-PDA, RIMA-OM1), hypertension, hyperlipidemia, diabetes, former smoker presenting to establish care.  Patient had symptoms of chest pain, admitted at [facility] where left heart cath showed significant three-vessel disease.  Underwent CABG x 3.  Recently presented with chest pain at [facility] ED, workup with left heart cath 01/04/2024 at [facility] showed patent LIMA to LAD, patent RIMA to OM1, patent SVG to RPDA.  Echocardiogram at [facility] 11/2023 EF 55 to 60%.  Has been doing okay since, quit smoking after his bypass surgery, eats healthy low-salt diet.  Has noticed occasional dizziness with occasional drop in blood pressures.  Also has some leg swelling.  Past Medical History:  Diagnosis Date   Anxiety    Arthritis    Chronic pain    Diabetes mellitus without complication (HCC)    GERD (gastroesophageal reflux disease)    History of degenerative disc disease    Hypercholesteremia    Hypertension    Knee pain, bilateral    Myocardial infarct [facility])    PTSD (post-traumatic stress disorder)    Pulmonary embolism (HCC)    Sleep apnea    Stroke (HCC) 2015   mold stroke per patient    Past Surgical History:  Procedure Laterality Date   ANGIOPLASTY / STENTING FEMORAL     2 stents   COLONOSCOPY WITH PROPOFOL N/A 05/07/2017   Procedure: COLONOSCOPY WITH PROPOFOL;  Surgeon: Christena Deem, MD;  Location: [facility] ENDOSCOPY;  Service: Endoscopy;  Laterality: N/A;   COLONOSCOPY WITH PROPOFOL N/A 04/11/2021   Procedure: COLONOSCOPY WITH PROPOFOL;  Surgeon: Pasty Spillers, MD;  Location: [facility] ENDOSCOPY;  Service: Endoscopy;  Laterality: N/A;   COLONOSCOPY WITH PROPOFOL N/A 03/21/2022   Procedure: COLONOSCOPY WITH PROPOFOL;  Surgeon: Toney Reil, MD;  Location: [facility] ENDOSCOPY;  Service: Gastroenterology;  Laterality: N/A;   ESOPHAGOGASTRODUODENOSCOPY (EGD) WITH PROPOFOL N/A 05/07/2017   Procedure: ESOPHAGOGASTRODUODENOSCOPY (EGD) WITH PROPOFOL;  Surgeon: Christena Deem, MD;  Location: [facility] ENDOSCOPY;  Service: Endoscopy;  Laterality: N/A;   HERNIA REPAIR     Knee surgeries     TONSILLECTOMY      Current Medications: Current Meds  Medication Sig   acetaminophen (TYLENOL) 500 MG tablet Take 500 mg by mouth every 4 (four) hours as needed.   amitriptyline (ELAVIL) 50 MG tablet Take 50 mg by mouth at bedtime.   aspirin EC 81 MG tablet Take 81 mg by mouth daily.   atorvastatin (LIPITOR) 80 MG tablet Take 80 mg by mouth daily.   cetirizine (ZYRTEC) 10 MG tablet Take 10 mg by mouth daily.   clopidogrel (PLAVIX) 75 MG tablet Take 75 mg by mouth daily.   cyclobenzaprine (FLEXERIL) 5 MG tablet Take 1 tablet (5 mg total) by mouth 3 (three) times daily as needed.   diphenhydrAMINE (BENADRYL)  25 mg capsule Take 50 mg by mouth every 6 (six) hours as needed for itching or allergies.   esomeprazole (NEXIUM) 20 MG capsule Take 20 mg by mouth daily as needed.   ezetimibe (ZETIA) 10 MG tablet Take 1 tablet (10 mg total) by mouth daily.   fluticasone (FLONASE) 50 MCG/ACT nasal spray Place 1 spray into both nostrils daily.   gabapentin (NEURONTIN) 600 MG tablet Take 600 mg by mouth 3 (three) times daily.   glimepiride (AMARYL) 2 MG tablet Take 2-4 mg by mouth 2 (two) times daily.   hydrOXYzine (ATARAX) 25 MG tablet Take 25 mg by mouth every 6 (six) hours as needed.   insulin lispro  (HUMALOG) 100 UNIT/ML KwikPen Inject into the skin.   isosorbide mononitrate (IMDUR) 30 MG 24 hr tablet Take 1 tablet (30 mg total) by mouth daily.   LANTUS SOLOSTAR 100 UNIT/ML Solostar Pen Inject into the skin.   losartan-hydrochlorothiazide (HYZAAR) 100-25 MG tablet Take 1 tablet by mouth daily.   meclizine (ANTIVERT) 12.5 MG tablet Take 1 tablet by mouth daily.   methocarbamol (ROBAXIN) 500 MG tablet Take by mouth.   metoprolol succinate (TOPROL-XL) 50 MG 24 hr tablet Take 50 mg by mouth daily.   naloxone (NARCAN) nasal spray 4 mg/0.1 mL 1 spray into alternating nostrils once as needed (for slow breathing secondary to too much pain medication) for up to 1 dose.   nitroGLYCERIN (NITROSTAT) 0.4 MG SL tablet Place 0.4 mg under the tongue every 5 (five) minutes as needed.   nystatin (MYCOSTATIN) 100000 UNIT/ML suspension Take 5 mLs (500,000 Units total) by mouth in the morning, at noon, and at bedtime. For 7 days, then prn recurrence   oxyCODONE (ROXICODONE) 15 MG immediate release tablet Take 15 mg by mouth every 4 (four) hours as needed for pain.   pantoprazole (PROTONIX) 40 MG tablet Take 40 mg by mouth daily.   polyethylene glycol powder (GLYCOLAX/MIRALAX) 17 GM/SCOOP powder Take by mouth as needed.   Semaglutide,0.25 or 0.5MG /DOS, 2 MG/3ML SOPN Inject 0.25 mg into the skin once a week.   sertraline (ZOLOFT) 50 MG tablet Take 50 mg by mouth at bedtime.   traZODone (DESYREL) 100 MG tablet Take 100 mg by mouth at bedtime.   trimethoprim-polymyxin b (POLYTRIM) ophthalmic solution Place 2 drops into both eyes 3 (three) times daily. For 7 days   [DISCONTINUED] amLODipine (NORVASC) 10 MG tablet Take 10 mg by mouth daily.     Allergies:   Bee venom, Iodinated contrast media, Lisinopril, Metformin, and Metformin and related   Social History   Socioeconomic History   Marital status: Married    Spouse name: Not on file   Number of children: Not on file   Years of education: Not on file    Highest education level: Not on file  Occupational History   Not on file  Tobacco Use   Smoking status: Every Day    Current packs/day: 0.00    Average packs/day: 1 pack/day for 20.0 years (20.0 ttl pk-yrs)    Types: Cigarettes    Start date: 04/21/2002    Last attempt to quit: 04/21/2022    Years since quitting: 1.8   Smokeless tobacco: Never  Vaping Use   Vaping status: Never Used  Substance and Sexual Activity   Alcohol use: Yes    Alcohol/week: 4.0 standard drinks of alcohol    Types: 4 Cans of beer per week    Comment: occas.    Drug use: Not Currently  Sexual activity: Yes  Other Topics Concern   Not on file  Social History Narrative   Not on file   Social Drivers of Health   Financial Resource Strain: Low Risk  (12/18/2023)   Received from [facility]   Overall Financial Resource Strain (CARDIA)    Difficulty of Paying Living Expenses: Not hard at all  Food Insecurity: No Food Insecurity (12/18/2023)   Received from [facility]   Hunger Vital Sign    Worried About Running Out of Food in the Last Year: Never true    Ran Out of Food in the Last Year: Never true  Transportation Needs: No Transportation Needs (12/18/2023)   Received from [facility] - Transportation    Lack of Transportation (Medical): No    Lack of Transportation (Non-Medical): No  Physical Activity: Patient Declined (12/16/2023)   Received from [facility]   Exercise Vital Sign    Days of Exercise per Week: Patient declined    Minutes of Exercise per Session: Patient declined  Stress: Patient Declined (12/16/2023)   Received from [facility] - Occupational Stress Questionnaire    Feeling of Stress : Patient declined  Social Connections: Patient Declined (12/16/2023)   Received from [facility]   Social Connection and Isolation Panel [NHANES]    Frequency of Communication with Friends and Family: Patient declined     Frequency of Social Gatherings with Friends and Family: Patient declined    Attends Religious Services: Patient declined    Database administrator or Organizations: Patient declined    Attends Engineer, structural: Patient declined    Marital Status: Patient declined     Family History: The patient's family history includes Cancer in his paternal grandfather and paternal grandmother; Diabetes in his mother; Heart attack (age of onset: 8) in his maternal grandfather.  ROS:   Please see the history of present illness.     All other systems reviewed and are negative.  EKGs/Labs/Other Studies Reviewed:    The following studies were reviewed today:  EKG Interpretation Date/Time:  Wednesday February 27 2024 12:07:18 EDT Ventricular Rate:  85 PR Interval:  160 QRS Duration:  100 QT Interval:  400 QTC Calculation: 476 R Axis:   31  Text Interpretation: Normal sinus rhythm T wave abnormality, consider lateral ischemia Confirmed by Debbe Odea ([id number]) on 02/27/2024 12:23:32 PM    Recent Labs: 08/14/2023: BUN 12; Creatinine, Ser 0.80; Hemoglobin 17.0; Platelets 251; Potassium 3.3; Sodium 134  Recent Lipid Panel    Component Value Date/Time   CHOL 207 (H) 05/09/2022 2305   CHOL 256 (H) 11/10/2014 0511   TRIG 566 (H) 05/09/2022 2305   TRIG 107 11/10/2014 0511   HDL 38 (L) 05/09/2022 2305   HDL 55 11/10/2014 0511   CHOLHDL 5.4 05/09/2022 2305   VLDL UNABLE TO CALCULATE IF TRIGLYCERIDE OVER 400 mg/dL 62/95/2841 3244   VLDL 21 11/10/2014 0511   LDLCALC UNABLE TO CALCULATE IF TRIGLYCERIDE OVER 400 mg/dL 11/22/7251 6644   LDLCALC 180 (H) 11/10/2014 0511   LDLDIRECT 113.6 (H) 05/09/2022 2305     Risk Assessment/Calculations:             Physical Exam:    VS:  BP 104/60 (BP Location: Right Arm, Patient Position: Sitting, Cuff Size: Normal)   Pulse 85   Ht 6' (1.829 m)   Wt 254 lb 8 oz (115.4 kg)  SpO2 97%   BMI 34.52 kg/m     Wt Readings from Last 3 Encounters:   02/27/24 254 lb 8 oz (115.4 kg)  08/14/23 240 lb (108.9 kg)  08/14/23 230 lb 14.4 oz (104.7 kg)     GEN:  Well nourished, well developed in no acute distress HEENT: Normal NECK: No JVD; No carotid bruits CARDIAC: RRR, no murmurs, rubs, gallops RESPIRATORY:  Clear to auscultation without rales, wheezing or rhonchi  ABDOMEN: Soft, non-tender, non-distended MUSCULOSKELETAL: 1+ edema; No deformity  SKIN: Warm and dry NEUROLOGIC:  Alert and oriented x 3 PSYCHIATRIC:  Normal affect   ASSESSMENT:    1. S/P CABG x 3   2. Primary hypertension   3. Mixed hyperlipidemia   4. Dizziness    PLAN:    In order of problems listed above:  CAD s/p CABG x 3 10/2023.  Continue aspirin 81 mg daily, Plavix, Lipitor 80 mg daily.  Toprol-XL 50 mg daily, Imdur 30 mg daily.  Okay for cardiac rehab. Hypertension, blood pressure controlled/low normal.  Occasional dizziness.  Reduce Norvasc to 5 mg daily.  Consider stopping if edema and dizziness persist..  Continue losartan-HCTZ 100-25 mg daily, Toprol-XL 50 mg daily, Norvasc 10 mg daily. Hyperlipidemia, LDL not at goal, triglycerides elevated.  Continue Lipitor 80 mg daily.  Add Zetia 10 mg daily.   Occasional dizziness, low BP, leg edema.  Reduce Norvasc to 5 mg as above.  Low threshold to stop Norvasc if symptoms persist.  Follow-up in 3 months.    Cardiac Rehabilitation Eligibility Assessment  The patient is ready to start cardiac rehabilitation from a cardiac standpoint.      Medication Adjustments/Labs and Tests Ordered: Current medicines are reviewed at length with the patient today.  Concerns regarding medicines are outlined above.  Orders Placed This Encounter  Procedures   AMB referral to cardiac rehabilitation   EKG 12-Lead   Meds ordered this encounter  Medications   amLODipine (NORVASC) 5 MG tablet    Sig: Take 1 tablet (5 mg total) by mouth daily.    Dispense:  90 tablet    Refill:  3   ezetimibe (ZETIA) 10 MG tablet    Sig:  Take 1 tablet (10 mg total) by mouth daily.    Dispense:  90 tablet    Refill:  3    Patient Instructions  Medication Instructions:  Your physician recommends the following medication changes.  START: Zetia 10 mg daily   DECREASE: Amlodipine from 10 mg daily to 5 mg daily   *If you need a refill on your cardiac medications before your next appointment, please call your pharmacy*  Lab Work: No labs ordered today.  Testing/Procedures: No test ordered today.  Follow-Up: At [facility], you and your health needs are our priority.  As part of our continuing mission to provide you with exceptional heart care, our providers are all part of one team.  This team includes your primary Cardiologist (physician) and Advanced Practice Providers or APPs (Physician Assistants and Nurse Practitioners) who all work together to provide you with the care you need, when you need it.  Your next appointment:   3 month(s)  Provider:   You may see Dr. Azucena Cecil or one of the following Advanced Practice Providers on your designated Care Team:   Nicolasa Ducking, NP Ames Dura, PA-C Eula Listen, PA-C Cadence Narrows, PA-C Charlsie Quest, NP Carlos Levering, NP    We recommend signing up for the patient portal called "MyChart".  Sign up information is provided on this After Visit Summary.  MyChart is used to connect with patients for Virtual Visits (Telemedicine).  Patients are able to view lab/test results, encounter notes, upcoming appointments, etc.  Non-urgent messages can be sent to your provider as well.   To learn more about what you can do with MyChart, go to [email or web address].            Signed, Debbe Odea, MD  02/27/2024 12:51 PM    [facility]

## 2024-03-28 ENCOUNTER — Ambulatory Visit: Attending: Medical | Admitting: Medical

## 2024-03-28 NOTE — Progress Notes (Deleted)
  Cardiology Office Note:  .   Date:  03/28/2024  ID:  Caleb [name], DOB [birth year], MRN [id number] PCP: Thais Fill, MD  [facility] Providers Cardiologist:  Antionette Kirks, MD { Click to update primary MD,subspecialty MD or APP then REFRESH:1}   History of Present Illness: .   Caleb [name] is a 48 y.o. male CAD s/p PCI to mLAD 2020, CABG x 3 in December 2024 (LIMA to LAD, SVG to PDA, RIMA to OM1), hypertension, hyperlipidemia, diabetes, former smoker who presents for 1 month follow-up.  The patient underwent CABG x 3 at [facility] where heart cath showed significant three-vessel disease.  He was seen at [facility] recently for chest pain.  Workup with left heart cath 01/04/2024 showed patent LIMA to LAD, patent RIMA to OM1, patent SVG to RPDA.  Echo in January 2025 showed EF of 55 to 60%.  He saw heart care as a new patient for 02/27/24 reporting occasional dizziness and Norvasc  was reduced to 5 mg daily.  Today,  ROS: ***  Studies Reviewed: .        *** Risk Assessment/Calculations:   {Does this patient have ATRIAL FIBRILLATION?:[phone]} No BP recorded.  {Refresh Note OR Click here to enter BP  :1}***       Physical Exam:   VS:  There were no vitals taken for this visit.   Wt Readings from Last 3 Encounters:  02/27/24 254 lb 8 oz (115.4 kg)  08/14/23 240 lb (108.9 kg)  08/14/23 230 lb 14.4 oz (104.7 kg)    GEN: Well nourished, well developed in no acute distress NECK: No JVD; No carotid bruits CARDIAC: ***RRR, no murmurs, rubs, gallops RESPIRATORY:  Clear to auscultation without rales, wheezing or rhonchi  ABDOMEN: Soft, non-tender, non-distended EXTREMITIES:  No edema; No deformity   ASSESSMENT AND PLAN: .   *** {The patient has an active order for outpatient cardiac rehabilitation.   Please indicate if the patient is ready to start. Do NOT delete this.  It will auto delete.  Refresh note, then sign.              Click here to document  readiness and see contraindications.  :1}  Cardiac Rehabilitation Eligibility Assessment  The patient is ready to start cardiac rehabilitation from a cardiac standpoint.    {Are you ordering a CV Procedure (e.g. stress test, cath, DCCV, TEE, etc)?   Press F2        :[id number]}  Dispo: ***  Signed, Latori Beggs Rebekah Canada, PA-C

## 2024-05-14 ENCOUNTER — Telehealth: Payer: Self-pay | Admitting: Cardiology

## 2024-05-14 NOTE — Telephone Encounter (Signed)
 MD to review and comment on whether the patient is cleared for the upcoming EGD and okay to hold Plavix for 5 days.  He was established with Dr. Darliss in April.  He had a prior history of CABG x 3 in December 2020 for.  Last cardiac catheterization in February 2025 showed patent LIMA-LAD, patent RIMA-OM1, patent SVG to RPDA.  Since the last office visit, patient has been admitted to the hospital multiple times for chest pain and suicidal ideation.  He was first admitted to the [facility] hospital in April 2025 for syncopal episode and chest pain.  Syncopal episode was felt to secondary to relative hypoglycemia.  Echocardiogram obtained during the hospitalization showed normal EF.  Cardiology service consulted and felt that no additional workup was warranted and he should follow-up with cardiologist as outpatient.  He was admitted to [facility] on 04/15/2024 for chest pain, nuclear stress test obtained during the hospitalization was normal.  He was admitted again on 04/28/2024 for chest pain at [facility], it was recommended he undergo GI evaluation for noncardiac chest pain.  Do you recommend he has another cardiology follow-up prior to cardiac clearance?  Or can the patient hold Plavix and be cleared for the upcoming EGD.  Please forward the response to P CV DIV PREOP

## 2024-05-14 NOTE — Telephone Encounter (Signed)
   Pre-operative Risk Assessment    Patient Name: Caleb [name]  DOB: 1976-02-04 MRN: [id number]   Date of last office visit: 02/27/24 Date of next office visit: 05/28/24   Request for Surgical Clearance    Procedure:  EGD  Date of Surgery:  Clearance TBD                                Surgeon:  not indicated Surgeon's Group or Practice Name:  [facility] Phone number:  [phone] Fax number:  [phone]   Type of Clearance Requested:   - Pharmacy:  Hold Clopidogrel (Plavix) 5 days prior   Type of Anesthesia:  Not Indicated   Additional requests/questions:    Signed, Gwendolyn H Slade   05/14/2024, 2:42 PM

## 2024-05-19 NOTE — Telephone Encounter (Signed)
   Patient Name: Caleb [name]  DOB: 1976/06/22 MRN: [id number]  Primary Cardiologist: Deatrice Cage, MD  Chart reviewed as part of pre-operative protocol coverage. Given past medical history and time since last visit, based on ACC/AHA guidelines, Diego Ulbricht is at acceptable risk for the planned procedure without further cardiovascular testing.   Patient can hold Plavix 5 days prior to procedure and should restart postprocedure when surgically safe hemostasis is achieved.  The patient was advised that if he develops new symptoms prior to surgery to contact our office to arrange for a follow-up visit, and he verbalized understanding.  I will route this recommendation to the requesting party via Epic fax function and remove from pre-op pool.  Please call with questions.  Wyn Raddle, Jackee Shove, NP 05/19/2024, 8:28 AM

## 2024-05-28 ENCOUNTER — Ambulatory Visit: Attending: Cardiology | Admitting: Cardiology

## 2024-07-02 ENCOUNTER — Ambulatory Visit: Admitting: Cardiology

## 2024-07-08 ENCOUNTER — Ambulatory Visit: Attending: Cardiology | Admitting: Cardiology

## 2024-07-08 NOTE — Progress Notes (Deleted)
 Cardiology Office Note    Date:  07/08/2024   ID:  Caleb [name], DOB 1976-02-11, MRN [id number]  PCP:  Avelina Sheldon, MD  Cardiologist:  Deatrice Cage, MD  Electrophysiologist:  None   Chief Complaint: ***  History of Present Illness:   Caleb [name] is a 48 y.o. male with history of CAD s/p PCI to PDA 2020, CABG x 3 in 10/2023 (LIMA to LAD, SVG to PDA, RIMA to OM1), hypertension, hyperlipidemia, T2 diabetes, polysubstance abuse including cocaine, opioids, and tobacco use, intermittent heavy alcohol use, PTSD, provoked DVT, and erosive gastritis who presents for ***.    Underwent cardiac catheterization due to abnormal EKG in 2014 by Dr. Ammon which showed minor irregularities with no significant obstructive disease.  He was hospitalized at [facility] 01/2017 for atypical chest pain.  Echo showed normal LVEF.  He was offered both stress testing and repeat catheterization but deferred.  He was admitted again at [facility] in 2019 for chest pain and underwent coronary CTA which showed calcium  score of 58 with mild to moderate CAD in the proximal and mid LAD with no significant stenosis per FFR.    He was admitted to [facility] in 2020 for chest pain and underwent left heart catheterization with DES to the mid LAD for 70 to 80% stenosis.  He was seen in follow-up by [facility] cardiology 09/2020 and reported difficulty adhering to medications due to socioeconomic factors.  He had not attended cardiac rehab or followed up with cardiology since his stent placement.  At that appointment, he reported symptoms consistent with unstable angina and was directly admitted for cardiac catheterization.  Cath showed nonflow limiting CAD in the LM, distal LAD, and RPL branches.  There was concern for spasm versus ostial torque on the left main.  Medical management was recommended.    He had several ED visits for chest pain with stable EKGs and negative troponins.  He was seen in follow-up with [facility]  cardiology 12/2020 and antianginal medications were titrated.  Patient again was admitted to [facility] 12/2021 with chest pain.  Troponins negative and no acute changes on EKG.  Echo showed preserved LV systolic function without RWMA.  Underwent repeat left heart catheterization which did not show any changes from prior cath.  Ongoing medical management was recommended.  Admitted to [facility] 11/2022 for chest pain and underwent repeat cardiac catheterization which showed mild to moderate nonobstructive CAD with ongoing medical management recommended.    He was admitted to [facility] 10/2023 for recurrent chest pain.  Underwent cardiac catheterization on 10/24/2023 which demonstrated MV CAD with 70% left main ostial stenosis, 70% restenosis of the mid LAD, 70% stenosis of the diagonal 2, and 70% proximal and ostial stenosis of the RCA he underwent CABG x 3 (SVG to PDA, fRIMA to OM1, LIMA to LAD) on 10/26/2023.    He underwent repeat catheterization at [facility] 12/2023 for chest pain which showed known native multivessel disease, patent grafts, and normal LVEDP.   Patient established care with Dr. Darliss 02/27/2024 and was reportedly doing well at that time.  Reported that he had quit smoking after his bypass surgery.  Patient was referred for cardiac rehab.  He was continued on amlodipine , aspirin , atorvastatin , clopidogrel, ezetimibe , isosorbide , losartan , metoprolol , and as needed nitroglycerin .  Patient has since been admitted at [facility] for recurrent chest pain 02/2024 with workup negative and no indication for further cardiac testing.  Echo showed preserved LV function with no RWMA.  He was admitted to  [facility] 04/15/2024 and underwent Lexiscan Myoview which did not show any evidence of ischemia, EF 67%.   He was admitted at [facility] 06/22/2024 for recurrent chest pain with negative troponins.  Underwent repeat left heart catheterization 06/24/2024 which revealed known underlying significant three-vessel CAD with patent grafts.  Of note,  patient reported that he continued to smoke at that time. Ongoing medical management was recommended and he was discharged in stable condition on 06/25/2024.  ***  Labs independently reviewed: 06/25/2024 Hgb 13.6, HCT 38.4, platelets 163, sodium 144, potassium 3.6, BUN 8, creatinine 0.69, normal LFTs, mag 1.7 04/16/2024 TC 136, TG 92, HDL 48, LDL 70  Objective   Past Medical History:  Diagnosis Date   Anxiety    Arthritis    Chronic pain    Diabetes mellitus without complication (HCC)    GERD (gastroesophageal reflux disease)    History of degenerative disc disease    Hypercholesteremia    Hypertension    Knee pain, bilateral    Myocardial infarct [facility])    PTSD (post-traumatic stress disorder)    Pulmonary embolism (HCC)    Sleep apnea    Stroke (HCC) 2015   mold stroke per patient    Current Medications: No outpatient medications have been marked as taking for the 07/08/24 encounter (Appointment) with Gerard Frederick, NP.    Allergies:   Bee venom, Iodinated contrast media, Lisinopril, Metformin, and Metformin and related   Social History   Socioeconomic History   Marital status: Married    Spouse name: Not on file   Number of children: Not on file   Years of education: Not on file   Highest education level: Not on file  Occupational History   Not on file  Tobacco Use   Smoking status: Every Day    Current packs/day: 0.00    Average packs/day: 1 pack/day for 20.0 years (20.0 ttl pk-yrs)    Types: Cigarettes    Start date: 04/21/2002    Last attempt to quit: 04/21/2022    Years since quitting: 2.2   Smokeless tobacco: Never  Vaping Use   Vaping status: Never Used  Substance and Sexual Activity   Alcohol use: Yes    Alcohol/week: 4.0 standard drinks of alcohol    Types: 4 Cans of beer per week    Comment: occas.    Drug use: Not Currently   Sexual activity: Yes  Other Topics Concern   Not on file  Social History Narrative   Not on file   Social Drivers of  Health   Financial Resource Strain: Low Risk  (12/18/2023)   Received from [facility]   Overall Financial Resource Strain (CARDIA)    Difficulty of Paying Living Expenses: Not hard at all  Food Insecurity: Low Risk  (04/15/2024)   Received from [facility]   Food Insecurity    Within the past 12 months, did the food you bought just not last and you didn't have money to get more?: No    Within the past 12 months, did you worry that your food would run out before you got money to buy more?: No  Transportation Needs: Low Risk  (04/15/2024)   Received from [facility]   Transportation Needs    Within the past 12 months, has a lack of transportation kept you from medical appointments or from doing things needed for daily living?: No  Physical Activity: Patient Declined (12/16/2023)   Received from [facility]  Exercise Vital Sign    On average, how many days per week do you engage in moderate to strenuous exercise (like a brisk walk)?: Patient declined    On average, how many minutes do you engage in exercise at this level?: Patient declined  Stress: Patient Declined (12/16/2023)   Received from [facility] - Occupational Stress Questionnaire    Feeling of Stress : Patient declined  Social Connections: Patient Declined (12/16/2023)   Received from [facility]   Social Connection and Isolation Panel    In a typical week, how many times do you talk on the phone with family, friends, or neighbors?: Patient declined    How often do you get together with friends or relatives?: Patient declined    How often do you attend church or religious services?: Patient declined    Do you belong to any clubs or organizations such as church groups, unions, fraternal or athletic groups, or school groups?: Patient declined    How often do you attend meetings of the clubs or organizations you belong to?: Patient declined     Are you married, widowed, divorced, separated, never married, or living with a partner?: Patient declined     Family History:  The patient's family history includes Cancer in his paternal grandfather and paternal grandmother; Diabetes in his mother; Heart attack (age of onset: 33) in his maternal grandfather.  ROS:   12-point review of systems is negative unless otherwise noted in the HPI.  EKGs/Other Studies Reviewed:    Studies reviewed were summarized above. The additional studies were reviewed today:  06/24/2024 [facility]) - 3 vessel coronary artery disease  - Patent LIMA to LAD  - Patent free RIMA Y-graft from LIMA to OM1  - Patent SVG to to RPDA   03/17/2024 Echo complete [facility]) 1. Technically difficult study.    2. The left ventricle is normal in size with mildly to moderately increased  wall thickness.    3. The left ventricular systolic function is normal, LVEF is visually  estimated at 60-65%.    4. The right ventricle is not well visualized but probably normal in size,  with normal systolic function.    5. The aorta is upper normal in size in the visualized segments.   EKG:  EKG personally reviewed by me today    PHYSICAL EXAM:    VS:  There were no vitals taken for this visit.  BMI: There is no height or weight on file to calculate BMI.  GEN: Well nourished, well developed in no acute distress NECK: No JVD; No carotid bruits CARDIAC: ***RRR, no murmurs, rubs, gallops RESPIRATORY:  Clear to auscultation without rales, wheezing or rhonchi  ABDOMEN: Soft, non-tender, non-distended EXTREMITIES:  *** No edema; No deformity  Wt Readings from Last 3 Encounters:  02/27/24 254 lb 8 oz (115.4 kg)  08/14/23 240 lb (108.9 kg)  08/14/23 230 lb 14.4 oz (104.7 kg)       ASSESSMENT & PLAN:   Coronary artery disease - S/p CABG x 3 in 10/2023.    Hypertension   Hyperlipidemia    {Are you ordering a CV Procedure (e.g. stress test, cath, DCCV, TEE, etc)?   Press F2         :[id number]}   Disposition: F/u with Dr. Darliss or an APP in ***.   Medication Adjustments/Labs and Tests Ordered: Current medicines are reviewed at length with the patient today.  Concerns regarding medicines  are outlined above. Medication changes, Labs and Tests ordered today are summarized above and listed in the Patient Instructions accessible in Encounters.   Bonney Lesley Maffucci, PA-C 07/08/2024 10:22 AM     [facility] [place] [address] [place], [place] [address] [phone]

## 2024-07-15 ENCOUNTER — Telehealth: Payer: Self-pay | Admitting: Cardiology

## 2024-07-15 NOTE — Telephone Encounter (Signed)
 Caller Dorothye) stated patient is going to be discharged and wants a call back to discuss patient's follow-up care.

## 2024-07-15 NOTE — Telephone Encounter (Signed)
 Talked with [facility], Dr. Jacklyn , who was wanting a follow up appointment for pt who was hospitalized 8/24-8/26/2025 at [facility]; during hospitalization pt complained of chest pain; Imdur  dose was changed from 40 mg to 60 mg and Dr. Jacklyn feels that he needed a follow up visit due to medication adjustment and EKG reported WNL, Troponin negative; follow up appointment scheduled with Dr. Darliss on 08/04/2024 @ 1120 am.  Dr. Jacklyn thanked me for returning his call and scheduling a follow up appointment

## 2024-08-04 ENCOUNTER — Encounter: Payer: Self-pay | Admitting: Cardiology

## 2024-08-04 ENCOUNTER — Ambulatory Visit: Attending: Cardiology | Admitting: Cardiology

## 2024-08-04 VITALS — BP 140/80 | HR 85 | Ht 72.0 in | Wt 241.8 lb

## 2024-08-04 DIAGNOSIS — I10 Essential (primary) hypertension: Secondary | ICD-10-CM | POA: Diagnosis not present

## 2024-08-04 DIAGNOSIS — E782 Mixed hyperlipidemia: Secondary | ICD-10-CM | POA: Diagnosis not present

## 2024-08-04 DIAGNOSIS — R072 Precordial pain: Secondary | ICD-10-CM

## 2024-08-04 DIAGNOSIS — Z951 Presence of aortocoronary bypass graft: Secondary | ICD-10-CM | POA: Diagnosis not present

## 2024-08-04 DIAGNOSIS — F17200 Nicotine dependence, unspecified, uncomplicated: Secondary | ICD-10-CM

## 2024-08-04 MED ORDER — RANOLAZINE ER 500 MG PO TB12: 500.0000 mg | ORAL_TABLET | Freq: Two times a day (BID) | ORAL | 3 refills | Status: AC

## 2024-08-04 MED ORDER — ISOSORBIDE MONONITRATE ER 60 MG PO TB24: 60.0000 mg | ORAL_TABLET | Freq: Every day | ORAL | 3 refills | Status: AC

## 2024-08-04 NOTE — Progress Notes (Signed)
 Cardiology Office Note:    Date:  08/04/2024   ID:  Caleb [name], DOB [birth year], MRN [id number]  PCP:  Avelina Sheldon, MD   [facility] Providers Cardiologist:  Deatrice Cage, MD     Referring MD: Avelina Sheldon, MD   Chief Complaint  Patient presents with   Follow-up    1 month follow up after ED  pt has complaints of chest pain, chest pressure or SOB, medciation reviewed verbally with patient. Pt states his left arm hurts a little bit    History of Present Illness:    Caleb [name] is a 48 y.o. male with a hx of CAD s/p PCI to mLAD 2020, CABG x 3 in 10/2023 (LIMA-LAD, SVG-PDA, RIMA-OM1), hypertension, hyperlipidemia, diabetes, current smoker presenting for follow-up.    Presented to [facility] ED a month ago with symptoms of chest pain concerning for possible unstable angina.  Lab work with troponins were negative.  He underwent left heart cath 06/24/2024 all 3 grafts were patent.  Of note he had a left heart cath 01/04/2024 at [facility] showing patent grafts.  Smoking cessation was advised.  Notes indicate Ingal was increased to 16, patient states taking Imdur  30 mg daily.  Still has chest pain/discomfort.  Prior notes/testing  [facility] where left heart cath showed significant three-vessel disease.  Underwent CABG x 3. left heart cath 01/04/2024 at [facility] showed patent LIMA to LAD, patent RIMA to OM1, patent SVG to RPDA. Echocardiogram at [facility] 11/2023 EF 55 to 60%.    Past Medical History:  Diagnosis Date   Anxiety    Arthritis    Chronic pain    Diabetes mellitus without complication (HCC)    GERD (gastroesophageal reflux disease)    History of degenerative disc disease    Hypercholesteremia    Hypertension    Knee pain, bilateral    Myocardial infarct [facility])    PTSD (post-traumatic stress disorder)    Pulmonary embolism (HCC)    Sleep apnea    Stroke (HCC) 2015   mold stroke per patient    Past Surgical History:  Procedure Laterality Date    ANGIOPLASTY / STENTING FEMORAL     2 stents   COLONOSCOPY WITH PROPOFOL  N/A 05/07/2017   Procedure: COLONOSCOPY WITH PROPOFOL ;  Surgeon: Gaylyn Gladis PENNER, MD;  Location: [facility] ENDOSCOPY;  Service: Endoscopy;  Laterality: N/A;   COLONOSCOPY WITH PROPOFOL  N/A 04/11/2021   Procedure: COLONOSCOPY WITH PROPOFOL ;  Surgeon: Janalyn Keene NOVAK, MD;  Location: [facility] ENDOSCOPY;  Service: Endoscopy;  Laterality: N/A;   COLONOSCOPY WITH PROPOFOL  N/A 03/21/2022   Procedure: COLONOSCOPY WITH PROPOFOL ;  Surgeon: Unk Corinn Skiff, MD;  Location: [facility] ENDOSCOPY;  Service: Gastroenterology;  Laterality: N/A;   ESOPHAGOGASTRODUODENOSCOPY (EGD) WITH PROPOFOL  N/A 05/07/2017   Procedure: ESOPHAGOGASTRODUODENOSCOPY (EGD) WITH PROPOFOL ;  Surgeon: Gaylyn Gladis PENNER, MD;  Location: [facility] ENDOSCOPY;  Service: Endoscopy;  Laterality: N/A;   HERNIA REPAIR     Knee surgeries     TONSILLECTOMY      Current Medications: Current Meds  Medication Sig   Accu-Chek Softclix Lancets lancets 1 each 3 (three) times daily.   acetaminophen  (TYLENOL ) 500 MG tablet Take 500 mg by mouth every 4 (four) hours as needed.   amitriptyline (ELAVIL) 50 MG tablet Take 50 mg by mouth at bedtime.   amLODipine  (NORVASC ) 5 MG tablet Take 1 tablet (5 mg total) by mouth daily.   aspirin  EC 81 MG tablet Take 81 mg by mouth daily.   atorvastatin  (LIPITOR ) 80  MG tablet Take 80 mg by mouth daily.   Blood Glucose Monitoring Suppl (ACCU-CHEK GUIDE ME) w/Device KIT See admin instructions.   cetirizine (ZYRTEC) 10 MG tablet Take 10 mg by mouth daily.   clopidogrel (PLAVIX) 75 MG tablet Take 75 mg by mouth daily.   cyclobenzaprine  (FLEXERIL ) 5 MG tablet Take 1 tablet (5 mg total) by mouth 3 (three) times daily as needed.   diazepam  (VALIUM ) 5 MG tablet Take 5 mg by mouth every 6 (six) hours as needed.   diphenhydrAMINE  (BENADRYL ) 25 mg capsule Take 50 mg by mouth every 6 (six) hours as needed for itching or allergies.   esomeprazole (NEXIUM) 20 MG  capsule Take 20 mg by mouth daily as needed.   ezetimibe  (ZETIA ) 10 MG tablet Take 1 tablet (10 mg total) by mouth daily.   fluticasone  (FLONASE ) 50 MCG/ACT nasal spray Place 1 spray into both nostrils daily.   gabapentin  (NEURONTIN ) 600 MG tablet Take 600 mg by mouth 3 (three) times daily.   glucose blood (FREESTYLE INSULINX TEST) test strip daily.   hydrOXYzine  (ATARAX ) 25 MG tablet Take 25 mg by mouth every 6 (six) hours as needed.   Lancets (FREESTYLE) lancets daily.   LANTUS  SOLOSTAR 100 UNIT/ML Solostar Pen Inject into the skin.   losartan -hydrochlorothiazide  (HYZAAR) 100-25 MG tablet Take 1 tablet by mouth daily.   meclizine (ANTIVERT) 12.5 MG tablet Take 1 tablet by mouth daily.   methocarbamol  (ROBAXIN ) 500 MG tablet Take by mouth.   metoprolol  succinate (TOPROL -XL) 50 MG 24 hr tablet Take 50 mg by mouth daily.   naloxone (NARCAN) nasal spray 4 mg/0.1 mL 1 spray into alternating nostrils once as needed (for slow breathing secondary to too much pain medication) for up to 1 dose.   nitroGLYCERIN  (NITROSTAT ) 0.4 MG SL tablet Place 0.4 mg under the tongue every 5 (five) minutes as needed.   nortriptyline  (PAMELOR ) 25 MG capsule Take 25 mg by mouth at bedtime.   nystatin  (MYCOSTATIN ) 100000 UNIT/ML suspension Take 5 mLs (500,000 Units total) by mouth in the morning, at noon, and at bedtime. For 7 days, then prn recurrence   oxyCODONE  (ROXICODONE ) 15 MG immediate release tablet Take 15 mg by mouth every 4 (four) hours as needed for pain.   pantoprazole  (PROTONIX ) 40 MG tablet Take 40 mg by mouth daily.   polyethylene glycol powder (GLYCOLAX /MIRALAX ) 17 GM/SCOOP powder Take by mouth as needed.   ranolazine  (RANEXA ) 500 MG 12 hr tablet Take 1 tablet (500 mg total) by mouth 2 (two) times daily.   Semaglutide ,0.25 or 0.5MG /DOS, 2 MG/3ML SOPN Inject 0.25 mg into the skin once a week.   sertraline (ZOLOFT) 50 MG tablet Take 50 mg by mouth at bedtime.   traZODone (DESYREL) 100 MG tablet Take 100 mg  by mouth at bedtime.   trimethoprim -polymyxin b  (POLYTRIM ) ophthalmic solution Place 2 drops into both eyes 3 (three) times daily. For 7 days   [DISCONTINUED] isosorbide  mononitrate (IMDUR ) 30 MG 24 hr tablet Take 1 tablet (30 mg total) by mouth daily.     Allergies:   Bee venom, Iodinated contrast media, Lisinopril, Metformin, and Metformin and related   Social History   Socioeconomic History   Marital status: Married    Spouse name: Not on file   Number of children: Not on file   Years of education: Not on file   Highest education level: Not on file  Occupational History   Not on file  Tobacco Use   Smoking status: Every Day  Current packs/day: 0.00    Average packs/day: 1 pack/day for 20.0 years (20.0 ttl pk-yrs)    Types: Cigarettes    Start date: 04/21/2002    Last attempt to quit: 04/21/2022    Years since quitting: 2.2   Smokeless tobacco: Never  Vaping Use   Vaping status: Never Used  Substance and Sexual Activity   Alcohol use: Yes    Alcohol/week: 4.0 standard drinks of alcohol    Types: 4 Cans of beer per week    Comment: occas.    Drug use: Not Currently   Sexual activity: Yes  Other Topics Concern   Not on file  Social History Narrative   Not on file   Social Drivers of Health   Financial Resource Strain: Low Risk  (07/08/2024)   Received from [facility]   Overall Financial Resource Strain (CARDIA)    How hard is it for you to pay for the very basics like food, housing, medical care, and heating?: Not hard at all  Food Insecurity: No Food Insecurity (07/08/2024)   Received from [facility]   Hunger Vital Sign    Within the past 12 months, you worried that your food would run out before you got the money to buy more.: Never true    Within the past 12 months, the food you bought just didn't last and you didn't have money to get more.: Never true  Transportation Needs: No Transportation Needs (07/08/2024)   Received from [facility]   PRAPARE -  Transportation    Lack of Transportation (Medical): No    Lack of Transportation (Non-Medical): No  Physical Activity: Patient Declined (12/16/2023)   Received from [facility]   Exercise Vital Sign    On average, how many days per week do you engage in moderate to strenuous exercise (like a brisk walk)?: Patient declined    On average, how many minutes do you engage in exercise at this level?: Patient declined  Stress: Patient Declined (12/16/2023)   Received from [facility] - Occupational Stress Questionnaire    Feeling of Stress : Patient declined  Social Connections: Patient Declined (12/16/2023)   Received from [facility]   Social Connection and Isolation Panel    In a typical week, how many times do you talk on the phone with family, friends, or neighbors?: Patient declined    How often do you get together with friends or relatives?: Patient declined    How often do you attend church or religious services?: Patient declined    Do you belong to any clubs or organizations such as church groups, unions, fraternal or athletic groups, or school groups?: Patient declined    How often do you attend meetings of the clubs or organizations you belong to?: Patient declined    Are you married, widowed, divorced, separated, never married, or living with a partner?: Patient declined     Family History: The patient's family history includes Cancer in his paternal grandfather and paternal grandmother; Diabetes in his mother; Heart attack (age of onset: 2) in his maternal grandfather.  ROS:   Please see the history of present illness.     All other systems reviewed and are negative.  EKGs/Labs/Other Studies Reviewed:    The following studies were reviewed today:  EKG Interpretation Date/Time:  Monday August 04 2024 11:13:52 EDT Ventricular Rate:  85 PR Interval:  160 QRS Duration:  94 QT Interval:  352 QTC Calculation: 418 R  Axis:   38  Text Interpretation: Normal sinus rhythm ST & T wave abnormality, consider inferolateral ischemia Confirmed by Darliss Rogue ([id number]) on 08/04/2024 11:26:38 AM    Recent Labs: 08/14/2023: BUN 12; Creatinine, Ser 0.80; Hemoglobin 17.0; Platelets 251; Potassium 3.3; Sodium 134  Recent Lipid Panel    Component Value Date/Time   CHOL 207 (H) 05/09/2022 2305   CHOL 256 (H) 11/10/2014 0511   TRIG 566 (H) 05/09/2022 2305   TRIG 107 11/10/2014 0511   HDL 38 (L) 05/09/2022 2305   HDL 55 11/10/2014 0511   CHOLHDL 5.4 05/09/2022 2305   VLDL UNABLE TO CALCULATE IF TRIGLYCERIDE OVER 400 mg/dL 93/79/7976 7694   VLDL 21 11/10/2014 0511   LDLCALC UNABLE TO CALCULATE IF TRIGLYCERIDE OVER 400 mg/dL 93/79/7976 7694   LDLCALC 180 (H) 11/10/2014 0511   LDLDIRECT 113.6 (H) 05/09/2022 2305     Risk Assessment/Calculations:     Physical Exam:    VS:  BP (!) 140/80 (BP Location: Left Arm, Patient Position: Sitting) Comment: pt states he is in pain  Pulse 85   Ht 6' (1.829 m)   Wt 241 lb 12.8 oz (109.7 kg)   SpO2 98%   BMI 32.79 kg/m     Wt Readings from Last 3 Encounters:  08/04/24 241 lb 12.8 oz (109.7 kg)  02/27/24 254 lb 8 oz (115.4 kg)  08/14/23 240 lb (108.9 kg)     GEN:  Well nourished, well developed in no acute distress HEENT: Normal NECK: No JVD; No carotid bruits CARDIAC: RRR, no murmurs, rubs, gallops RESPIRATORY:  Clear to auscultation without rales, wheezing or rhonchi  ABDOMEN: Soft, non-tender, non-distended MUSCULOSKELETAL: 1+ edema; No deformity  SKIN: Warm and dry NEUROLOGIC:  Alert and oriented x 3 PSYCHIATRIC:  Normal affect   ASSESSMENT:    1. Precordial pain   2. S/P CABG x 3   3. Primary hypertension   4. Mixed hyperlipidemia   5. Current smoker    PLAN:    In order of problems listed above:  CAD s/p CABG x 3 10/2023.  Left heart cath 06/24/2024 with patent grafts.  Stable angina, current smoker.  Increase Imdur  to 60 mg daily, start Ranexa   500 mg twice daily.  Continue aspirin  81 mg daily, Plavix, Lipitor  80 mg daily.  Toprol -XL 50 mg daily. Hypertension, blood pressure slightly elevated, patient complains of pain currently. Continue losartan -HCTZ 100-25 mg daily, Toprol -XL 50 mg daily, Norvasc  5 mg daily.  Increase Imdur  to 60 mg daily as above. Hyperlipidemia, continue Lipitor  80 mg daily, Zetia  10 mg daily. Current smoker, smoking cessation advised.  Follow-up in 8 weeks      Medication Adjustments/Labs and Tests Ordered: Current medicines are reviewed at length with the patient today.  Concerns regarding medicines are outlined above.  Orders Placed This Encounter  Procedures   EKG 12-Lead   Meds ordered this encounter  Medications   isosorbide  mononitrate (IMDUR ) 60 MG 24 hr tablet    Sig: Take 1 tablet (60 mg total) by mouth daily.    Dispense:  30 tablet    Refill:  3   ranolazine  (RANEXA ) 500 MG 12 hr tablet    Sig: Take 1 tablet (500 mg total) by mouth 2 (two) times daily.    Dispense:  60 tablet    Refill:  3    Patient Instructions  Medication Instructions:  - START ranexa  500 mg twice a day - INCREASE imdur  to 60 mg  daily  *If you need a refill on your cardiac medications before your next appointment, please call your pharmacy*  Lab Work: No labs ordered today  If you have labs (blood work) drawn today and your tests are completely normal, you will receive your results only by: MyChart Message (if you have MyChart) OR A paper copy in the mail If you have any lab test that is abnormal or we need to change your treatment, we will call you to review the results.  Testing/Procedures: No test ordered today   Follow-Up: At [facility], you and your health needs are our priority.  As part of our continuing mission to provide you with exceptional heart care, our providers are all part of one team.  This team includes your primary Cardiologist (physician) and Advanced Practice Providers or APPs  (Physician Assistants and Nurse Practitioners) who all work together to provide you with the care you need, when you need it.  Your next appointment:   8 week(s)  Provider:   You may see Deatrice Cage, MD or one of the following Advanced Practice Providers on your designated Care Team:   Lonni Meager, NP Lesley Maffucci, PA-C Bernardino Bring, PA-C Cadence [place], PA-C Tylene Lunch, NP Barnie Hila, NP    We recommend signing up for the patient portal called MyChart.  Sign up information is provided on this After Visit Summary.  MyChart is used to connect with patients for Virtual Visits (Telemedicine).  Patients are able to view lab/test results, encounter notes, upcoming appointments, etc.  Non-urgent messages can be sent to your provider as well.   To learn more about what you can do with MyChart, go to [email or web address].   Other Instructions          Signed, Redell Cave, MD  08/04/2024 12:34 PM    [facility]

## 2024-08-04 NOTE — Patient Instructions (Signed)
 Medication Instructions:  - START ranexa  500 mg twice a day - INCREASE imdur  to 60 mg daily  *If you need a refill on your cardiac medications before your next appointment, please call your pharmacy*  Lab Work: No labs ordered today  If you have labs (blood work) drawn today and your tests are completely normal, you will receive your results only by: MyChart Message (if you have MyChart) OR A paper copy in the mail If you have any lab test that is abnormal or we need to change your treatment, we will call you to review the results.  Testing/Procedures: No test ordered today   Follow-Up: At [facility], you and your health needs are our priority.  As part of our continuing mission to provide you with exceptional heart care, our providers are all part of one team.  This team includes your primary Cardiologist (physician) and Advanced Practice Providers or APPs (Physician Assistants and Nurse Practitioners) who all work together to provide you with the care you need, when you need it.  Your next appointment:   8 week(s)  Provider:   You may see Deatrice Cage, MD or one of the following Advanced Practice Providers on your designated Care Team:   Lonni Meager, NP Lesley Maffucci, PA-C Bernardino Bring, PA-C Cadence [place], PA-C Tylene Lunch, NP Barnie Hila, NP    We recommend signing up for the patient portal called MyChart.  Sign up information is provided on this After Visit Summary.  MyChart is used to connect with patients for Virtual Visits (Telemedicine).  Patients are able to view lab/test results, encounter notes, upcoming appointments, etc.  Non-urgent messages can be sent to your provider as well.   To learn more about what you can do with MyChart, go to [email or web address].   Other Instructions

## 2024-10-02 ENCOUNTER — Ambulatory Visit: Attending: Cardiology | Admitting: Cardiology

## 2024-10-03 ENCOUNTER — Encounter: Payer: Self-pay | Admitting: Cardiology
